# Patient Record
Sex: Male | Born: 1955 | Race: White | Hispanic: No | Smoking: Current every day smoker
Health system: Southern US, Community
[De-identification: ages and names within clinical notes are randomized; demographics above are authoritative.]

## PROBLEM LIST (undated history)

## (undated) DIAGNOSIS — I10 Essential (primary) hypertension: Secondary | ICD-10-CM

## (undated) DIAGNOSIS — F329 Major depressive disorder, single episode, unspecified: Secondary | ICD-10-CM

## (undated) DIAGNOSIS — K759 Inflammatory liver disease, unspecified: Secondary | ICD-10-CM

## (undated) DIAGNOSIS — K219 Gastro-esophageal reflux disease without esophagitis: Secondary | ICD-10-CM

## (undated) DIAGNOSIS — M1990 Unspecified osteoarthritis, unspecified site: Secondary | ICD-10-CM

## (undated) DIAGNOSIS — F32A Depression, unspecified: Secondary | ICD-10-CM

## (undated) HISTORY — PX: WISDOM TOOTH EXTRACTION: SHX21

---

## 2009-07-08 ENCOUNTER — Ambulatory Visit: Payer: Self-pay | Admitting: Internal Medicine

## 2009-09-04 ENCOUNTER — Ambulatory Visit (HOSPITAL_COMMUNITY): Admission: RE | Admit: 2009-09-04 | Discharge: 2009-09-04 | Payer: Self-pay | Admitting: Family Medicine

## 2009-09-10 ENCOUNTER — Ambulatory Visit: Payer: Self-pay | Admitting: Internal Medicine

## 2009-09-11 ENCOUNTER — Encounter (INDEPENDENT_AMBULATORY_CARE_PROVIDER_SITE_OTHER): Payer: Self-pay | Admitting: Family Medicine

## 2009-09-11 LAB — CONVERTED CEMR LAB: Microalb, Ur: 1.43 mg/dL (ref 0.00–1.89)

## 2009-10-11 ENCOUNTER — Encounter (INDEPENDENT_AMBULATORY_CARE_PROVIDER_SITE_OTHER): Payer: Self-pay | Admitting: Family Medicine

## 2009-10-11 ENCOUNTER — Ambulatory Visit: Payer: Self-pay | Admitting: Internal Medicine

## 2009-10-11 LAB — CONVERTED CEMR LAB
ALT: 79 units/L — ABNORMAL HIGH (ref 0–53)
AST: 71 units/L — ABNORMAL HIGH (ref 0–37)
Eosinophils Absolute: 0.3 10*3/uL (ref 0.0–0.7)
HCT: 47.4 % (ref 39.0–52.0)
MCHC: 33.8 g/dL (ref 30.0–36.0)
Monocytes Absolute: 1.1 10*3/uL — ABNORMAL HIGH (ref 0.1–1.0)
Monocytes Relative: 14 % — ABNORMAL HIGH (ref 3–12)
Neutro Abs: 3.6 10*3/uL (ref 1.7–7.7)
Platelets: 254 10*3/uL (ref 150–400)
RBC: 4.98 M/uL (ref 4.22–5.81)
Total Bilirubin: 0.4 mg/dL (ref 0.3–1.2)
WBC: 8 10*3/uL (ref 4.0–10.5)

## 2009-10-15 ENCOUNTER — Encounter (INDEPENDENT_AMBULATORY_CARE_PROVIDER_SITE_OTHER): Payer: Self-pay | Admitting: Family Medicine

## 2009-10-15 LAB — CONVERTED CEMR LAB
Hep A Total Ab: NEGATIVE
Hep B Core Total Ab: NEGATIVE

## 2013-11-07 ENCOUNTER — Other Ambulatory Visit: Payer: Self-pay

## 2013-11-15 ENCOUNTER — Other Ambulatory Visit (INDEPENDENT_AMBULATORY_CARE_PROVIDER_SITE_OTHER): Payer: No Typology Code available for payment source

## 2013-11-15 DIAGNOSIS — B182 Chronic viral hepatitis C: Secondary | ICD-10-CM

## 2013-11-15 LAB — CBC WITH DIFFERENTIAL/PLATELET
BASOS PCT: 2 % — AB (ref 0–1)
Basophils Absolute: 0.1 10*3/uL (ref 0.0–0.1)
EOS ABS: 0.2 10*3/uL (ref 0.0–0.7)
Eosinophils Relative: 3 % (ref 0–5)
HCT: 37.1 % — ABNORMAL LOW (ref 39.0–52.0)
HEMOGLOBIN: 13.1 g/dL (ref 13.0–17.0)
LYMPHS ABS: 2.5 10*3/uL (ref 0.7–4.0)
Lymphocytes Relative: 43 % (ref 12–46)
MCH: 33.2 pg (ref 26.0–34.0)
MCHC: 35.3 g/dL (ref 30.0–36.0)
MCV: 94.2 fL (ref 78.0–100.0)
MONO ABS: 0.8 10*3/uL (ref 0.1–1.0)
Monocytes Relative: 14 % — ABNORMAL HIGH (ref 3–12)
NEUTROS ABS: 2.2 10*3/uL (ref 1.7–7.7)
NEUTROS PCT: 38 % — AB (ref 43–77)
PLATELETS: 239 10*3/uL (ref 150–400)
RBC: 3.94 MIL/uL — AB (ref 4.22–5.81)
RDW: 13.7 % (ref 11.5–15.5)
WBC: 5.8 10*3/uL (ref 4.0–10.5)

## 2013-11-16 LAB — PROTIME-INR
INR: 1.02 (ref <1.50)
Prothrombin Time: 13.4 seconds (ref 11.6–15.2)

## 2013-11-16 LAB — COMPREHENSIVE METABOLIC PANEL
ALK PHOS: 42 U/L (ref 39–117)
ALT: 106 U/L — ABNORMAL HIGH (ref 0–53)
AST: 97 U/L — ABNORMAL HIGH (ref 0–37)
Albumin: 4.5 g/dL (ref 3.5–5.2)
BILIRUBIN TOTAL: 0.4 mg/dL (ref 0.2–1.2)
BUN: 8 mg/dL (ref 6–23)
CALCIUM: 9.2 mg/dL (ref 8.4–10.5)
CHLORIDE: 108 meq/L (ref 96–112)
CO2: 24 meq/L (ref 19–32)
Creat: 0.73 mg/dL (ref 0.50–1.35)
GLUCOSE: 76 mg/dL (ref 70–99)
POTASSIUM: 4.7 meq/L (ref 3.5–5.3)
Sodium: 140 mEq/L (ref 135–145)
TOTAL PROTEIN: 7.1 g/dL (ref 6.0–8.3)

## 2013-11-16 LAB — HEPATITIS B SURFACE ANTIBODY,QUALITATIVE: Hep B S Ab: POSITIVE — AB

## 2013-11-16 LAB — HEPATITIS B SURFACE ANTIGEN: Hepatitis B Surface Ag: NEGATIVE

## 2013-11-16 LAB — IRON: IRON: 152 ug/dL (ref 42–165)

## 2013-11-16 LAB — HEPATITIS B CORE ANTIBODY, TOTAL: HEP B C TOTAL AB: NONREACTIVE

## 2013-11-16 LAB — HEPATITIS A ANTIBODY, TOTAL: Hep A Total Ab: NONREACTIVE

## 2013-11-16 LAB — ANA: Anti Nuclear Antibody(ANA): NEGATIVE

## 2013-11-16 LAB — HIV ANTIBODY (ROUTINE TESTING W REFLEX): HIV: NONREACTIVE

## 2013-11-17 LAB — HEPATITIS C RNA QUANTITATIVE
HCV Quantitative Log: 6.39 {Log} — ABNORMAL HIGH (ref <1.18)
HCV Quantitative: 2457982 IU/mL — ABNORMAL HIGH (ref <15)

## 2013-11-21 LAB — HEPATITIS C GENOTYPE

## 2013-11-30 ENCOUNTER — Telehealth: Payer: Self-pay | Admitting: *Deleted

## 2013-11-30 NOTE — Telephone Encounter (Signed)
Patient was referred by Dr. Marlou Sa at [facility] at [facility] for Hepatitis.  Patient called asking about his recent lab work about his liver enzymes.  He wanted to know why he had so many labs drawn.   Patient was advised he was sent here for labs to confirm hepatitis C.  Patient was advised that we check for diseases associated with Hepatitis as well as more specific HCV tests for treatment options.   Patient stated he was treated about 2 years ago for Hepatitis, but it failed due to his alcohol use.  Patient states he is an alcoholic who drinks multiple 40's throughout the day.  He states that he feels symptoms of withdrawal before he starts drinking each day.  Patient was advised that Dr. Linus Salmons will take this into account, will discuss support opportunities in the community.  Patient is scheduled for 10/19 with Dr. Linus Salmons.  He is advised to consider the steps he would need to commit to for sobriety.  He agreed. Landis Gandy, RN

## 2013-12-25 ENCOUNTER — Ambulatory Visit: Payer: No Typology Code available for payment source | Admitting: Internal Medicine

## 2014-01-30 ENCOUNTER — Encounter: Payer: Self-pay | Admitting: Internal Medicine

## 2014-01-30 ENCOUNTER — Ambulatory Visit (INDEPENDENT_AMBULATORY_CARE_PROVIDER_SITE_OTHER): Payer: No Typology Code available for payment source | Admitting: Internal Medicine

## 2014-01-30 ENCOUNTER — Ambulatory Visit (INDEPENDENT_AMBULATORY_CARE_PROVIDER_SITE_OTHER): Payer: No Typology Code available for payment source | Admitting: *Deleted

## 2014-01-30 VITALS — BP 122/83 | HR 70 | Temp 97.3°F | Wt 175.0 lb

## 2014-01-30 DIAGNOSIS — Z23 Encounter for immunization: Secondary | ICD-10-CM

## 2014-01-30 DIAGNOSIS — B182 Chronic viral hepatitis C: Secondary | ICD-10-CM

## 2014-01-30 NOTE — Addendum Note (Signed)
Addended by: Reggy Eye on: 01/30/2014 02:41 PM   Modules accepted: Orders

## 2014-01-30 NOTE — Progress Notes (Signed)
+Mark Ayala is a 58 y.o. male who presents for initial evaluation and management of a positive Hepatitis C antibody test.  Patient tested positive 20 years ago. Hepatitis C risk factors present are: IV drug abuse (details: remote, many years ago). Patient denies history of blood transfusion, multiple sexual partners, renal dialysis, sexual contact with person with liver disease, tattoos. Patient has had other studies performed. Results: hepatitis C RNA by PCR, result: positive. Patient has not had prior treatment for Hepatitis C. Patient does not have a past history of liver disease. Patient does not have a family history of liver disease.   HPI: He is an active alcohol user though has been abstinent over the last 3 weeks.  He struggles with abuse but is motivated to continue off alcohol.  He has had legal issues related to alcohol and understands the importance of not drinking for those reasons and for his liver.   Patient does not have documented immunity to Hepatitis A. Patient does have documented immunity to Hepatitis B.     Review of Systems A comprehensive review of systems was negative.   No past medical history on file.  Prior to Admission medications   Medication Sig Start Date End Date Taking? Authorizing Provider  citalopram (CELEXA) 40 MG tablet Take 40 mg by mouth daily.   Yes Historical Provider, MD  losartan (COZAAR) 100 MG tablet Take 100 mg by mouth daily.   Yes Historical Provider, MD    No Known Allergies  History  Substance Use Topics  . Smoking status: Light Tobacco Smoker    Types: Cigars  . Smokeless tobacco: Never Used  . Alcohol Use: No    No family history on file.    Objective:   Filed Vitals:   01/30/14 0932  BP: 122/83  Pulse: 70  Temp: 97.3 F (36.3 C)   in no apparent distress and alert HEENT: anicteric Cor RRR and No murmurs clear Bowel sounds are normal, liver is not enlarged, spleen is not enlarged peripheral pulses normal, no pedal  edema, no clubbing or cyanosis negative for - jaundice, spider hemangioma, telangiectasia, palmar erythema, ecchymosis and atrophy  Laboratory Genotype:  Lab Results  Component Value Date   HCVGENOTYPE 1a 11/15/2013   HCV viral load:  Lab Results  Component Value Date   HCVQUANT [id number]* 11/15/2013   Lab Results  Component Value Date   WBC 5.8 11/15/2013   HGB 13.1 11/15/2013   HCT 37.1* 11/15/2013   MCV 94.2 11/15/2013   PLT 239 11/15/2013    Lab Results  Component Value Date   CREATININE 0.73 11/15/2013   BUN 8 11/15/2013   NA 140 11/15/2013   K 4.7 11/15/2013   CL 108 11/15/2013   CO2 24 11/15/2013    Lab Results  Component Value Date   ALT 106* 11/15/2013   AST 97* 11/15/2013   ALKPHOS 42 11/15/2013   BILITOT 0.4 11/15/2013   INR 1.02 11/15/2013      Assessment: Hepatitis C genotype 1a  Plan: 1) Patient counseled extensively on limiting acetaminophen to no more than 2 grams daily, avoidance of alcohol. 2) Transmission discussed with patient including sexual transmission, sharing razors and toothbrush.   3) Will need referral to gastroenterology if concern for cirrhosis 4) Will need referral for substance abuse counseling: Yes.  Will refer him to SA counseling to help him continue to cope without alcohol, find services if needed such as AA.   5) Will prescribe Harvoni for 12 weeks once  work up complete 6) Hepatitis A vaccine Yes.   7) Hepatitis B vaccine No. 8) Pneumovax vaccine if concern for cirrhosis 9) will follow up after elastography to go over results

## 2014-02-15 ENCOUNTER — Ambulatory Visit (HOSPITAL_COMMUNITY)
Admission: RE | Admit: 2014-02-15 | Discharge: 2014-02-15 | Disposition: A | Discharge status: Home / Self Care | Payer: No Typology Code available for payment source | Source: Ambulatory Visit | Attending: Internal Medicine | Admitting: Internal Medicine

## 2014-02-15 ENCOUNTER — Other Ambulatory Visit: Payer: Self-pay | Admitting: Internal Medicine

## 2014-02-15 DIAGNOSIS — B182 Chronic viral hepatitis C: Secondary | ICD-10-CM | POA: Insufficient documentation

## 2014-02-15 MED ORDER — LEDIPASVIR-SOFOSBUVIR 90-400 MG PO TABS: 1.0000 | ORAL_TABLET | Freq: Every day | ORAL | Status: DC

## 2014-03-13 ENCOUNTER — Encounter: Payer: Self-pay | Admitting: Internal Medicine

## 2014-03-13 ENCOUNTER — Ambulatory Visit (INDEPENDENT_AMBULATORY_CARE_PROVIDER_SITE_OTHER): Payer: Self-pay | Admitting: Internal Medicine

## 2014-03-13 VITALS — BP 153/91 | HR 87 | Temp 98.1°F | Ht 71.0 in | Wt 178.0 lb

## 2014-03-13 DIAGNOSIS — B182 Chronic viral hepatitis C: Secondary | ICD-10-CM

## 2014-03-13 NOTE — Patient Instructions (Signed)
Date 03/13/14  Dear Mr. Vanatta, As discussed in the [facility], your hepatitis C therapy will include the following medications:          Harvoni 90mg /400mg  tablet:           Take 1 tablet by mouth once daily   Please note that ALL MEDICATIONS WILL START ON THE SAME DATE for a total of 12 weeks. 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  Remember, each prescription is for 28 days. ---------------------------------------------------------------- GENERAL NOTES REGARDING YOUR HEPATITIS C MEDICATION:  SOFOSBUVIR/LEDIPASVIR (HARVONI): - Harvoni tablet is taken daily with OR without food. - The tablets are orange. - The tablets should be stored at room temperature.  - Acid reducing agents such as H2 blockers (ie. Pepcid (famotidine), Zantac (ranitidine), Tagamet (cimetidine), Axid (nizatidine) and proton pump inhibitors (ie. Prilosec (omeprazole), Protonix (pantoprazole), Nexium (esomeprazole), or Aciphex (rabeprazole)) can decrease effectiveness of Harvoni. Do not take until you have discussed with a health care provider.    -Antacids that contain magnesium and/or aluminum hydroxide (ie. Milk of Magensia, Rolaids, Gaviscon, Maalox, Mylanta, an dArthritis Pain Formula)can reduce absorption of Harvoni, so take them at least 4 hours before or after Harvoni.  -Calcium carbonate (calcium supplements or antacids such as Tums, Caltrate,  Os-Cal)needs to be taken at least 4 hours hours before or after Harvoni.  -St. John's wort or any products that contain St. John's wort like some herbal supplements  Please inform the office prior to starting any of these medications.  - The common side effects with Harvoni:      1. Fatigue      2. Headache      3. Nausea      4. Diarrhea      5. Insomnia   Please note that this only lists the most common side effects and is NOT a comprehensive list of the potential side effects of these medications. For more information, please review the drug information sheets that come with your medication package from the pharmacy.  ---------------------------------------------------------------- GENERAL HELPFUL HINTS ON HCV THERAPY: 1. No alcohol. 2. Protect against sun-sensitivity/sunburns (wear sunglasses, hat, long sleeves, pants and sunscreen). 3. Stay well-hydrated/well-moisturized. 4. Notify the [facility] of any changes in your other over-the-counter/herbal or prescription medications. 5. If you miss a dose of your medication, take the missed dose as soon as you remember. Return to your regular time/dose schedule the next day.  6.  Do not stop taking your medications without first talking with your healthcare provider. 7.  You may take Tylenol (acetaminophen), as long as the dose is less than 2000 mg (OR no more than 4 tablets of the Tylenol Extra Strengths 500mg  tablet) in 24 hours. 8.  You will need to obtain routine labs and/or office visits at [facility] at weeks 2, 4, 8,  and 12 as well as 12 and 24 weeks after completion of treatment.   Scharlene Gloss, [place] [facility] [address] [place], [place]  [address] [phone]

## 2014-03-13 NOTE — Progress Notes (Signed)
   Subjective:    Patient ID: Mark Ayala, male    DOB: 07/09/1955, 59 y.o.   MRN: [id number]  HPI He is here for follow-up of his hepatitis C.  He has genotype 1A with initial viral load of over 2 million. He has a remote history of drug abuse and does drink alcohol. He had elastography which is F2 to [facility].     Review of Systems  Constitutional: Negative for fatigue.  Gastrointestinal: Negative for abdominal pain.  Skin: Negative for rash.       Objective:   Physical Exam  Constitutional: He appears well-developed and well-nourished. No distress.  Eyes: No scleral icterus.  Cardiovascular: Normal rate, regular rhythm and normal heart sounds.   No murmur heard. Pulmonary/Chest: Effort normal and breath sounds normal. No respiratory distress.  Skin: No rash noted.          Assessment & Plan:

## 2014-03-13 NOTE — Assessment & Plan Note (Signed)
I discussed results of his elastography. He continues to abstain from alcohol. Hopefully will get medication through the drug assistance program. He will follow-up after starting medication or in one year.

## 2014-03-14 ENCOUNTER — Other Ambulatory Visit: Payer: Self-pay | Admitting: Internal Medicine

## 2014-03-14 MED ORDER — LEDIPASVIR-SOFOSBUVIR 90-400 MG PO TABS: 1.0000 | ORAL_TABLET | Freq: Every day | ORAL | Status: DC

## 2014-03-15 ENCOUNTER — Ambulatory Visit: Payer: No Typology Code available for payment source | Admitting: Internal Medicine

## 2014-07-07 ENCOUNTER — Emergency Department (HOSPITAL_COMMUNITY)
Admission: EM | Admit: 2014-07-07 | Discharge: 2014-07-07 | Disposition: A | Discharge status: Home / Self Care | Payer: Self-pay | Attending: Emergency Medicine | Admitting: Emergency Medicine

## 2014-07-07 ENCOUNTER — Emergency Department (HOSPITAL_COMMUNITY): Payer: Self-pay

## 2014-07-07 ENCOUNTER — Encounter (HOSPITAL_COMMUNITY): Payer: Self-pay | Admitting: Emergency Medicine

## 2014-07-07 DIAGNOSIS — F10120 Alcohol abuse with intoxication, uncomplicated: Secondary | ICD-10-CM | POA: Insufficient documentation

## 2014-07-07 DIAGNOSIS — S50311A Abrasion of right elbow, initial encounter: Secondary | ICD-10-CM | POA: Insufficient documentation

## 2014-07-07 DIAGNOSIS — F10920 Alcohol use, unspecified with intoxication, uncomplicated: Secondary | ICD-10-CM

## 2014-07-07 DIAGNOSIS — Y998 Other external cause status: Secondary | ICD-10-CM | POA: Insufficient documentation

## 2014-07-07 DIAGNOSIS — S0990XA Unspecified injury of head, initial encounter: Secondary | ICD-10-CM | POA: Insufficient documentation

## 2014-07-07 DIAGNOSIS — Z79899 Other long term (current) drug therapy: Secondary | ICD-10-CM | POA: Insufficient documentation

## 2014-07-07 DIAGNOSIS — S50312A Abrasion of left elbow, initial encounter: Secondary | ICD-10-CM | POA: Insufficient documentation

## 2014-07-07 DIAGNOSIS — Y9289 Other specified places as the place of occurrence of the external cause: Secondary | ICD-10-CM | POA: Insufficient documentation

## 2014-07-07 DIAGNOSIS — Y9389 Activity, other specified: Secondary | ICD-10-CM | POA: Insufficient documentation

## 2014-07-07 DIAGNOSIS — W01198A Fall on same level from slipping, tripping and stumbling with subsequent striking against other object, initial encounter: Secondary | ICD-10-CM | POA: Insufficient documentation

## 2014-07-07 NOTE — Discharge Instructions (Signed)
Alcohol Intoxication  Alcohol intoxication occurs when the amount of alcohol that a person has consumed impairs his or her ability to mentally and physically function. Alcohol directly impairs the normal chemical activity of the brain. Drinking large amounts of alcohol can lead to changes in mental function and behavior, and it can cause many physical effects that can be harmful.   Alcohol intoxication can range in severity from mild to very severe. Various factors can affect the level of intoxication that occurs, such as the person's age, gender, weight, frequency of alcohol consumption, and the presence of other medical conditions (such as diabetes, seizures, or heart conditions). Dangerous levels of alcohol intoxication may occur when people drink large amounts of alcohol in a short period (binge drinking). Alcohol can also be especially dangerous when combined with certain prescription medicines or "recreational" drugs.  SIGNS AND SYMPTOMS  Some common signs and symptoms of mild alcohol intoxication include:  · Loss of coordination.  · Changes in mood and behavior.  · Impaired judgment.  · Slurred speech.  As alcohol intoxication progresses to more severe levels, other signs and symptoms will appear. These may include:  · Vomiting.  · Confusion and impaired memory.  · Slowed breathing.  · Seizures.  · Loss of consciousness.  DIAGNOSIS   Your health care provider will take a medical history and perform a physical exam. You will be asked about the amount and type of alcohol you have consumed. Blood tests will be done to measure the concentration of alcohol in your blood. In many places, your blood alcohol level must be lower than 80 mg/dL (0.08%) to legally drive. However, many dangerous effects of alcohol can occur at much lower levels.   TREATMENT   People with alcohol intoxication often do not require treatment. Most of the effects of alcohol intoxication are temporary, and they go away as the alcohol naturally  leaves the body. Your health care provider will monitor your condition until you are stable enough to go home. Fluids are sometimes given through an IV access tube to help prevent dehydration.   HOME CARE INSTRUCTIONS  · Do not drive after drinking alcohol.  · Stay hydrated. Drink enough water and fluids to keep your urine clear or pale yellow. Avoid caffeine.    · Only take over-the-counter or prescription medicines as directed by your health care provider.    SEEK MEDICAL CARE IF:   · You have persistent vomiting.    · You do not feel better after a few days.  · You have frequent alcohol intoxication. Your health care provider can help determine if you should see a substance use treatment counselor.  SEEK IMMEDIATE MEDICAL CARE IF:   · You become shaky or tremble when you try to stop drinking.    · You shake uncontrollably (seizure).    · You throw up (vomit) blood. This may be bright red or may look like black coffee grounds.    · You have blood in your stool. This may be bright red or may appear as a black, tarry, bad smelling stool.    · You become lightheaded or faint.    MAKE SURE YOU:   · Understand these instructions.  · Will watch your condition.  · Will get help right away if you are not doing well or get worse.  Document Released: 12/03/2004 Document Revised: 10/26/2012 Document Reviewed: 07/29/2012  ExitCare® Patient Information ©2015 [employer]. This information is not intended to replace advice given to you by your health care provider. Make sure   you discuss any questions you have with your health care provider.

## 2014-07-07 NOTE — ED Notes (Signed)
Bed: WA16 Expected date:  Expected time:  Means of arrival:  Comments: EMS 

## 2014-07-07 NOTE — ED Notes (Signed)
Patient transported to CT Patient in NAD upon leaving for testing

## 2014-07-07 NOTE — ED Notes (Signed)
Pt reports drinking liquor tonight which he states he doesn't normally do.  Pt is now pleasant and agreeable at this time.  Pt remains unsteady on his feet.
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
## 2016-02-05 ENCOUNTER — Other Ambulatory Visit: Payer: Self-pay | Admitting: Gastroenterology

## 2016-02-05 ENCOUNTER — Encounter (HOSPITAL_COMMUNITY): Payer: Self-pay | Admitting: *Deleted

## 2016-02-10 ENCOUNTER — Encounter (HOSPITAL_COMMUNITY)
Admission: RE | Disposition: A | Discharge status: Home / Self Care | Payer: Self-pay | Source: Ambulatory Visit | Attending: Gastroenterology

## 2016-02-10 ENCOUNTER — Ambulatory Visit (HOSPITAL_COMMUNITY): Payer: Self-pay | Admitting: Anesthesiology

## 2016-02-10 ENCOUNTER — Ambulatory Visit (HOSPITAL_COMMUNITY)
Admission: RE | Admit: 2016-02-10 | Discharge: 2016-02-10 | Disposition: A | Discharge status: Home / Self Care | Payer: Self-pay | Source: Ambulatory Visit | Attending: Gastroenterology | Admitting: Gastroenterology

## 2016-02-10 ENCOUNTER — Encounter (HOSPITAL_COMMUNITY): Payer: Self-pay | Admitting: *Deleted

## 2016-02-10 DIAGNOSIS — K2950 Unspecified chronic gastritis without bleeding: Secondary | ICD-10-CM | POA: Insufficient documentation

## 2016-02-10 DIAGNOSIS — G4730 Sleep apnea, unspecified: Secondary | ICD-10-CM | POA: Insufficient documentation

## 2016-02-10 DIAGNOSIS — Z8619 Personal history of other infectious and parasitic diseases: Secondary | ICD-10-CM | POA: Insufficient documentation

## 2016-02-10 DIAGNOSIS — K2210 Ulcer of esophagus without bleeding: Secondary | ICD-10-CM | POA: Insufficient documentation

## 2016-02-10 DIAGNOSIS — K449 Diaphragmatic hernia without obstruction or gangrene: Secondary | ICD-10-CM | POA: Insufficient documentation

## 2016-02-10 DIAGNOSIS — Z833 Family history of diabetes mellitus: Secondary | ICD-10-CM | POA: Insufficient documentation

## 2016-02-10 DIAGNOSIS — Z8249 Family history of ischemic heart disease and other diseases of the circulatory system: Secondary | ICD-10-CM | POA: Insufficient documentation

## 2016-02-10 DIAGNOSIS — G4700 Insomnia, unspecified: Secondary | ICD-10-CM | POA: Insufficient documentation

## 2016-02-10 DIAGNOSIS — K3189 Other diseases of stomach and duodenum: Secondary | ICD-10-CM | POA: Insufficient documentation

## 2016-02-10 DIAGNOSIS — I10 Essential (primary) hypertension: Secondary | ICD-10-CM | POA: Insufficient documentation

## 2016-02-10 DIAGNOSIS — D125 Benign neoplasm of sigmoid colon: Secondary | ICD-10-CM | POA: Insufficient documentation

## 2016-02-10 DIAGNOSIS — K5520 Angiodysplasia of colon without hemorrhage: Secondary | ICD-10-CM | POA: Insufficient documentation

## 2016-02-10 DIAGNOSIS — Z87891 Personal history of nicotine dependence: Secondary | ICD-10-CM | POA: Insufficient documentation

## 2016-02-10 DIAGNOSIS — Z1211 Encounter for screening for malignant neoplasm of colon: Secondary | ICD-10-CM | POA: Insufficient documentation

## 2016-02-10 DIAGNOSIS — F329 Major depressive disorder, single episode, unspecified: Secondary | ICD-10-CM | POA: Insufficient documentation

## 2016-02-10 DIAGNOSIS — D123 Benign neoplasm of transverse colon: Secondary | ICD-10-CM | POA: Insufficient documentation

## 2016-02-10 DIAGNOSIS — Z885 Allergy status to narcotic agent status: Secondary | ICD-10-CM | POA: Insufficient documentation

## 2016-02-10 DIAGNOSIS — Z79899 Other long term (current) drug therapy: Secondary | ICD-10-CM | POA: Insufficient documentation

## 2016-02-10 HISTORY — DX: Unspecified osteoarthritis, unspecified site: M19.90

## 2016-02-10 HISTORY — DX: Major depressive disorder, single episode, unspecified: F32.9

## 2016-02-10 HISTORY — DX: Depression, unspecified: F32.A

## 2016-02-10 HISTORY — PX: ESOPHAGOGASTRODUODENOSCOPY (EGD) WITH PROPOFOL: SHX5813

## 2016-02-10 HISTORY — DX: Inflammatory liver disease, unspecified: K75.9

## 2016-02-10 HISTORY — DX: Gastro-esophageal reflux disease without esophagitis: K21.9

## 2016-02-10 HISTORY — DX: Essential (primary) hypertension: I10

## 2016-02-10 HISTORY — PX: COLONOSCOPY WITH PROPOFOL: SHX5780

## 2016-02-10 SURGERY — COLONOSCOPY WITH PROPOFOL
Anesthesia: Monitor Anesthesia Care

## 2016-02-10 MED ORDER — LIDOCAINE 2% (20 MG/ML) 5 ML SYRINGE
INTRAMUSCULAR | Status: AC
  Filled 2016-02-10: qty 5

## 2016-02-10 MED ORDER — LACTATED RINGERS IV SOLN
INTRAVENOUS | Status: DC
  Administered 2016-02-10: 1000 mL via INTRAVENOUS

## 2016-02-10 MED ORDER — LIDOCAINE HCL (CARDIAC) 20 MG/ML IV SOLN
INTRAVENOUS | Status: DC | PRN
  Administered 2016-02-10: 100 mg via INTRAVENOUS

## 2016-02-10 MED ORDER — PROPOFOL 10 MG/ML IV BOLUS
INTRAVENOUS | Status: AC
  Filled 2016-02-10: qty 20

## 2016-02-10 MED ORDER — GLYCOPYRROLATE 0.2 MG/ML IJ SOLN
INTRAMUSCULAR | Status: DC | PRN
  Administered 2016-02-10: 0.2 mg via INTRAVENOUS

## 2016-02-10 MED ORDER — SODIUM CHLORIDE 0.9 % IV SOLN: INTRAVENOUS | Status: DC

## 2016-02-10 MED ORDER — PROPOFOL 500 MG/50ML IV EMUL
INTRAVENOUS | Status: DC | PRN
  Administered 2016-02-10: 300 ug/kg/min via INTRAVENOUS

## 2016-02-10 MED ORDER — GLYCOPYRROLATE 0.2 MG/ML IV SOSY
PREFILLED_SYRINGE | INTRAVENOUS | Status: AC
Start: 2016-02-10 — End: 2016-02-10
  Filled 2016-02-10: qty 3

## 2016-02-10 MED ORDER — PROPOFOL 10 MG/ML IV BOLUS
INTRAVENOUS | Status: AC
  Filled 2016-02-10: qty 60

## 2016-02-10 SURGICAL SUPPLY — 24 items

## 2016-02-10 NOTE — Discharge Instructions (Signed)
Colonoscopy, Adult, Care After This sheet gives you information about how to care for yourself after your procedure. Your doctor may also give you more specific instructions. If you have problems or questions, call your doctor. Follow these instructions at home: General instructions  For the first 24 hours after the procedure:  Do not drive or use machinery.  Do not sign important documents.  Do not drink alcohol.  Do your daily activities more slowly than normal.  Eat foods that are soft and easy to digest.  Rest often.  Take over-the-counter or prescription medicines only as told by your doctor.  It is up to you to get the results of your procedure. Ask your doctor, or the department performing the procedure, when your results will be ready. To help cramping and bloating:  Try walking around.  Put heat on your belly (abdomen) as told by your doctor. Use a heat source that your doctor recommends, such as a moist heat pack or a heating pad.  Put a towel between your skin and the heat source.  Leave the heat on for 20-30 minutes.  Remove the heat if your skin turns bright red. This is especially important if you cannot feel pain, heat, or cold. You can get burned. Eating and drinking  Drink enough fluid to keep your pee (urine) clear or pale yellow.  Return to your normal diet as told by your doctor. Avoid heavy or fried foods that are hard to digest.  Avoid drinking alcohol for as long as told by your doctor. Contact a doctor if:  You have blood in your poop (stool) 2-3 days after the procedure. Get help right away if:  You have more than a small amount of blood in your poop.  You see large clumps of tissue (blood clots) in your poop.  Your belly is swollen.  You feel sick to your stomach (nauseous).  You throw up (vomit).  You have a fever.  You have belly pain that gets worse, and medicine does not help your pain. This information is not intended to replace  advice given to you by your health care provider. Make sure you discuss any questions you have with your health care provider. Document Released: 03/28/2010 Document Revised: 11/18/2015 Document Reviewed: 11/18/2015 Elsevier Interactive Patient Education  2017 [place]. Esophagogastroduodenoscopy, Care After Introduction Refer to this sheet in the next few weeks. These instructions provide you with information about caring for yourself after your procedure. Your health care provider may also give you more specific instructions. Your treatment has been planned according to current medical practices, but problems sometimes occur. Call your health care provider if you have any problems or questions after your procedure. What can I expect after the procedure? After the procedure, it is common to have:  A sore throat.  Nausea.  Bloating.  Dizziness.  Fatigue. Follow these instructions at home:  Do not eat or drink anything until the numbing medicine (local anesthetic) has worn off and your gag reflex has returned. You will know that the local anesthetic has worn off when you can swallow comfortably.  Do not drive for 24 hours if you received a medicine to help you relax (sedative).  If your health care provider took a tissue sample for testing during the procedure, make sure to get your test results. This is your responsibility. Ask your health care provider or the department performing the test when your results will be ready.  Keep all follow-up visits as told by  your health care provider. This is important. Contact a health care provider if:  You cannot stop coughing.  You are not urinating.  You are urinating less than usual. Get help right away if:  You have trouble swallowing.  You cannot eat or drink.  You have throat or chest pain that gets worse.  You are dizzy or light-headed.  You faint.  You have nausea or vomiting.  You have chills.  You have a  fever.  You have severe abdominal pain.  You have black, tarry, or bloody stools. This information is not intended to replace advice given to you by your health care provider. Make sure you discuss any questions you have with your health care provider. Document Released: 02/10/2012 Document Revised: 08/01/2015 Document Reviewed: 01/17/2015  2017 Elsevier Call if question or problem otherwise call in 1 week for biopsy report and start Prilosec 1 a day over-the-counter or get from primary care and take long-term for reflux

## 2016-02-10 NOTE — Anesthesia Preprocedure Evaluation (Signed)
Anesthesia Evaluation  Patient identified by MRN, date of birth, ID band Patient awake    Reviewed: Allergy & Precautions, H&P , NPO status , Patient's Chart, lab work & pertinent test results  Airway Mallampati: II   Neck ROM: full    Dental   Pulmonary former smoker,    breath sounds clear to auscultation       Cardiovascular hypertension,  Rhythm:regular Rate:Normal     Neuro/Psych PSYCHIATRIC DISORDERS Depression    GI/Hepatic GERD  ,(+)     substance abuse  alcohol use, Hepatitis -, C  Endo/Other    Renal/GU      Musculoskeletal  (+) Arthritis ,   Abdominal   Peds  Hematology   Anesthesia Other Findings   Reproductive/Obstetrics                             Anesthesia Physical Anesthesia Plan  ASA: II  Anesthesia Plan: MAC   Post-op Pain Management:    Induction: Intravenous  Airway Management Planned: Nasal Cannula  Additional Equipment:   Intra-op Plan:   Post-operative Plan:   Informed Consent: I have reviewed the patients History and Physical, chart, labs and discussed the procedure including the risks, benefits and alternatives for the proposed anesthesia with the patient or authorized representative who has indicated his/her understanding and acceptance.     Plan Discussed with: CRNA, Anesthesiologist and Surgeon  Anesthesia Plan Comments:         Anesthesia Quick Evaluation

## 2016-02-10 NOTE — Op Note (Signed)
[facility] Patient Name: Mark Ayala Procedure Date: 02/10/2016 MRN: [id number] Attending MD: Clarene Essex , MD Date of Birth: May 08, 1955 CSN: [id number] Age: 60 Admit Type: Outpatient Procedure:                Colonoscopy Indications:              Screening for colorectal malignant neoplasm, This                            is the patient's first colonoscopy Providers:                Clarene Essex, MD, Dustin Flock RN, RN, William Dalton, Technician Referring MD:              Medicines:                Propofol total dose [id number] mg IV Complications:            No immediate complications. Estimated Blood Loss:     Estimated blood loss: none. Procedure:                Pre-Anesthesia Assessment:                           - Prior to the procedure, a History and Physical                            was performed, and patient medications and                            allergies were reviewed. The patient's tolerance of                            previous anesthesia was also reviewed. The risks                            and benefits of the procedure and the sedation                            options and risks were discussed with the patient.                            All questions were answered, and informed consent                            was obtained. Prior Anticoagulants: The patient has                            taken no previous anticoagulant or antiplatelet                            agents. ASA Grade Assessment: II - A patient with  mild systemic disease. After reviewing the risks                            and benefits, the patient was deemed in                            satisfactory condition to undergo the procedure.                           After obtaining informed consent, the colonoscope                            was passed under direct vision. Throughout the                            procedure, the  patient's blood pressure, pulse, and                            oxygen saturations were monitored continuously. The                            [id number]) scope was introduced through the                            mouth, and advanced to the the cecum, identified by                            appendiceal orifice and ileocecal valve. After                            obtaining informed consent, the colonoscope was                            passed under direct vision. Throughout the                            procedure, the patient's blood pressure, pulse, and                            oxygen saturations were monitored continuously. The                            colonoscopy was performed without difficulty. The                            patient tolerated the procedure well. The quality                            of the bowel preparation was adequate. The quality                            of the bowel preparation was adequate. The  ileocecal valve, appendiceal orifice, and rectum                            were photographed. Scope In: 11:24:49 AM Scope Out: 11:42:36 AM Scope Withdrawal Time: 0 hours 12 minutes 42 seconds  Total Procedure Duration: 0 hours 17 minutes 47 seconds  Findings:      Two semi-sessile polyps were found in the mid sigmoid colon and mid       transverse colon. The polyps were small in size. These polyps were       removed with a cold snare. Resection and retrieval were complete.      The exam was otherwise without abnormality.      A single small angiodysplastic lesion without bleeding was found in the       proximal ascending colon. Impression:               - Two small polyps in the mid sigmoid colon and in                            the mid transverse colon, removed with a cold                            snare. Resected and retrieved.                           - The examination was otherwise normal.                           -  A single non-bleeding colonic angiodysplastic                            lesion. Moderate Sedation:      N/A- Per Anesthesia Care Recommendation:           - Patient has a contact number available for                            emergencies. The signs and symptoms of potential                            delayed complications were discussed with the                            patient. Return to normal activities tomorrow.                            Written discharge instructions were provided to the                            patient.                           - Soft diet today.                           - Continue present medications.                           -  Await pathology results.                           - Repeat colonoscopy in 5 years for surveillance                            based on pathology results.                           - Return to GI office PRN.                           - Telephone GI clinic for pathology results in 1                            week.                           - Telephone GI clinic if symptomatic PRN. Procedure Code(s):        --- Professional ---                           [phone], Colonoscopy, flexible; with removal of                            tumor(s), polyp(s), or other lesion(s) by snare                            technique Diagnosis Code(s):        --- Professional ---                           Z12.11, Encounter for screening for malignant                            neoplasm of colon                           D12.5, Benign neoplasm of sigmoid colon                           D12.3, Benign neoplasm of transverse colon (hepatic                            flexure or splenic flexure) CPT copyright 2016 [employer]. All rights reserved. The codes documented in this report are preliminary and upon coder review may  be revised to meet current compliance requirements. Clarene Essex, MD 02/10/2016 11:53:00 AM This report has been signed  electronically. Number of Addenda: 0

## 2016-02-10 NOTE — Progress Notes (Signed)
Mark Ayala 10:23 AM  Subjective: Patient without any new complaints since we saw him in the office but he did drink some beer this morning  Objective: Vital signs stable afebrile no acute distress exam please see preassessment evaluation  Assessment: Reflux and colonic screening and variceal evaluation for probable cirrhosis  Plan: Okay to proceed with colonoscopy and endoscopy with anesthesia assistance unfortunately will wait the allotted time since he drank this morning as above  [facility]  Pager [phone] After 5PM or if no answer call [phone]

## 2016-02-10 NOTE — Op Note (Signed)
[facility] Patient Name: Mark Ayala Procedure Date: 02/10/2016 MRN: [id number] Attending MD: Clarene Essex , MD Date of Birth: 15-Feb-1956 CSN: [id number] Age: 60 Admit Type: Outpatient Procedure:                Upper GI endoscopy Indications:              Suspected esophageal reflux Providers:                Clarene Essex, MD, Dustin Flock RN, RN, William Dalton, Technician Referring MD:              Medicines:                Propofol total dose 150 mg IV,100 mg IV lidocaine                            0.2 mg Robinul Complications:            No immediate complications. Estimated Blood Loss:     Estimated blood loss: none. Procedure:                Pre-Anesthesia Assessment:                           - Prior to the procedure, a History and Physical                            was performed, and patient medications and                            allergies were reviewed. The patient's tolerance of                            previous anesthesia was also reviewed. The risks                            and benefits of the procedure and the sedation                            options and risks were discussed with the patient.                            All questions were answered, and informed consent                            was obtained. Prior Anticoagulants: The patient has                            taken no previous anticoagulant or antiplatelet                            agents. ASA Grade Assessment: II - A patient with  mild systemic disease. After reviewing the risks                            and benefits, the patient was deemed in                            satisfactory condition to undergo the procedure.                           After obtaining informed consent, the endoscope was                            passed under direct vision. Throughout the                            procedure, the patient's blood  pressure, pulse, and                            oxygen saturations were monitored continuously. The                            [id number]) scope was introduced through                            the anus and advanced to the third part of                            duodenum. After obtaining informed consent, the                            endoscope was passed under direct vision.                            Throughout the procedure, the patient's blood                            pressure, pulse, and oxygen saturations were                            monitored continuously. The upper GI endoscopy was                            accomplished without difficulty. The patient                            tolerated the procedure well. Scope In: Scope Out: Findings:      The larynx was normal.      Few linear esophageal ulcers with no bleeding and no stigmata of recent       bleeding were found.      A small hiatal hernia was present.      Patchy minimal inflammation characterized by congestion (edema) and       erythema was found in the gastric antrum.      Localized mildly erythematous mucosa was found in the duodenal bulb.  The second portion of the duodenum and third portion of the duodenum       were normal.      The exam was otherwise without abnormality. Impression:               - Normal larynx.                           - Non-bleeding esophageal ulcers.                           - Small hiatal hernia.                           - Chronic gastritis.                           - Erythematous duodenopathy.                           - Normal second portion of the duodenum and third                            portion of the duodenum.                           - The examination was otherwise normal.                           - No specimens collected. Moderate Sedation:      N/A- Per Anesthesia Care Recommendation:           - Patient has a contact number available for                             emergencies. The signs and symptoms of potential                            delayed complications were discussed with the                            patient. Return to normal activities tomorrow.                            Written discharge instructions were provided to the                            patient.                           - Soft diet today.                           - Perform a colonoscopy today.                           - Return to GI clinic PRN.                           -  Telephone GI clinic if symptomatic PRN.                           - Continue present medications. Procedure Code(s):        --- Professional ---                           [phone], Esophagogastroduodenoscopy, flexible,                            transoral; diagnostic, including collection of                            specimen(s) by brushing or washing, when performed                            (separate procedure) Diagnosis Code(s):        --- Professional ---                           K22.10, Ulcer of esophagus without bleeding                           K44.9, Diaphragmatic hernia without obstruction or                            gangrene                           K29.50, Unspecified chronic gastritis without                            bleeding                           K31.89, Other diseases of stomach and duodenum CPT copyright 2016 American Medical Association. All rights reserved. The codes documented in this report are preliminary and upon coder review may  be revised to meet current compliance requirements. Clarene Essex, MD 02/10/2016 11:48:57 AM This report has been signed electronically. Number of Addenda: 0

## 2016-02-10 NOTE — Anesthesia Postprocedure Evaluation (Signed)
Anesthesia Post Note  Patient: Mark Ayala  Procedure(s) Performed: Procedure(s) (LRB): COLONOSCOPY WITH PROPOFOL (N/A) ESOPHAGOGASTRODUODENOSCOPY (EGD) WITH PROPOFOL (N/A)  Patient location during evaluation: PACU Anesthesia Type: MAC Level of consciousness: awake and alert Pain management: pain level controlled Vital Signs Assessment: post-procedure vital signs reviewed and stable Respiratory status: spontaneous breathing, nonlabored ventilation, respiratory function stable and patient connected to nasal cannula oxygen Cardiovascular status: stable and blood pressure returned to baseline Anesthetic complications: no    Last Vitals:  Vitals:   02/10/16 1210 02/10/16 1220  BP: (!) 199/112 (!) 174/105  Pulse: 73   Resp: 18   Temp:      Last Pain:  Vitals:   02/10/16 1151  TempSrc: Oral                 Alleen Kehm S

## 2016-02-10 NOTE — Transfer of Care (Signed)
Immediate Anesthesia Transfer of Care Note  Patient: Rodrick Priestly  Procedure(s) Performed: Procedure(s): COLONOSCOPY WITH PROPOFOL (N/A) ESOPHAGOGASTRODUODENOSCOPY (EGD) WITH PROPOFOL (N/A)  Patient Location: PACU  Anesthesia Type:MAC  Level of Consciousness: awake, alert , oriented and patient cooperative  Airway & Oxygen Therapy: Patient Spontanous Breathing and Patient connected to nasal cannula oxygen  Post-op Assessment: Report given to RN, Post -op Vital signs reviewed and stable and Patient moving all extremities X 4  Post vital signs: stable  Last Vitals:  Vitals:   02/10/16 1008 02/10/16 1151  BP: (!) 156/95 (!) 146/103  Pulse:  74  Resp: 18 15  Temp: 36.3 C 36.3 C    Last Pain:  Vitals:   02/10/16 1151  TempSrc: Oral         Complications: No apparent anesthesia complications

## 2016-02-11 ENCOUNTER — Encounter (HOSPITAL_COMMUNITY): Payer: Self-pay | Admitting: Gastroenterology

## 2016-08-05 ENCOUNTER — Encounter (HOSPITAL_COMMUNITY): Payer: Self-pay | Admitting: Family Medicine

## 2016-08-05 ENCOUNTER — Ambulatory Visit (HOSPITAL_COMMUNITY)
Admission: EM | Admit: 2016-08-05 | Discharge: 2016-08-05 | Disposition: A | Discharge status: Home / Self Care | Payer: Self-pay | Attending: Emergency Medicine | Admitting: Emergency Medicine

## 2016-08-05 DIAGNOSIS — R21 Rash and other nonspecific skin eruption: Secondary | ICD-10-CM

## 2016-08-05 MED ORDER — HYDROXYZINE HCL 25 MG PO TABS: 25.0000 mg | ORAL_TABLET | Freq: Four times a day (QID) | ORAL | 0 refills | Status: DC

## 2016-08-05 NOTE — ED Provider Notes (Signed)
CSN: [id number]     Arrival date & time 08/05/16  1326 History   None    Chief Complaint  Patient presents with  . Rash   (Consider location/radiation/quality/duration/timing/severity/associated sxs/prior Treatment) 61 yr old male pt presents with recurrent rash, treated several weeks prior for presumed scabies. Pt states not improved after treatment or got re infested. Pt denies animals or contact/travel with possible mite exposure.    The history is provided by the patient. No language interpreter was used.  Rash  Location:  Full body Quality: itchiness and redness   Severity:  Moderate Onset quality:  Gradual Timing:  Constant Progression:  Unchanged Chronicity:  Recurrent Associated symptoms: no fever     Past Medical History:  Diagnosis Date  . Arthritis   . Depression   . GERD (gastroesophageal reflux disease)   . Hepatitis    Hepititis C  . Hypertension    Past Surgical History:  Procedure Laterality Date  . COLONOSCOPY WITH PROPOFOL N/A 02/10/2016   Procedure: COLONOSCOPY WITH PROPOFOL;  Surgeon: Clarene Essex, MD;  Location: [facility];  Service: Endoscopy;  Laterality: N/A;  . ESOPHAGOGASTRODUODENOSCOPY (EGD) WITH PROPOFOL N/A 02/10/2016   Procedure: ESOPHAGOGASTRODUODENOSCOPY (EGD) WITH PROPOFOL;  Surgeon: Clarene Essex, MD;  Location: [facility];  Service: Endoscopy;  Laterality: N/A;  . WISDOM TOOTH EXTRACTION     upper right wisdom tooth   History reviewed. No pertinent family history. Social History  Substance Use Topics  . Smoking status: Former Smoker    Types: Cigars    Quit date: 12/08/2015  . Smokeless tobacco: Never Used     Comment: stopped smpoking cigarettes 7 years ago  . Alcohol use 0.0 oz/week    Review of Systems  Constitutional: Negative for fever.  HENT: Negative.   Eyes: Negative.   Respiratory: Negative.   Cardiovascular: Negative.   Gastrointestinal: Negative.   Endocrine: Negative.   Genitourinary: Negative.   Musculoskeletal:  Negative.   Skin: Positive for rash.  Allergic/Immunologic: Negative.   Neurological: Negative.   Hematological: Negative.   Psychiatric/Behavioral: Negative.   All other systems reviewed and are negative.   Allergies  Codeine  Home Medications   Prior to Admission medications   Medication Sig Start Date End Date Taking? Authorizing Provider  citalopram (CELEXA) 40 MG tablet Take 40 mg by mouth daily.    [provider]  D3 SUPER STRENGTH 2000 units CAPS Take 2,000 Units by mouth daily. 12/09/15   [provider]  lactulose (CHRONULAC) 10 GM/15ML solution Take 20 g by mouth daily.    [provider]  losartan (COZAAR) 100 MG tablet Take 100 mg by mouth daily.    [provider]  Tetrahydrozoline HCl (EYE DROPS OP) Apply 1 drop to eye daily as needed (red eyes).    [provider]   Meds Ordered and Administered this Visit  Medications - No data to display  BP (!) 148/94   Pulse 91   Temp 98.2 F (36.8 C) (Oral)   Resp 18   SpO2 95%  No data found.   Physical Exam  Constitutional: He is oriented to person, place, and time. He appears well-developed and well-nourished. He is active and cooperative.  HENT:  Head: Normocephalic.  Neck: Normal range of motion.  Cardiovascular: Normal rate.   Pulmonary/Chest: Effort normal.  Abdominal: Normal appearance.  Musculoskeletal: Normal range of motion.  Neurological: He is alert and oriented to person, place, and time. GCS eye subscore is 4. GCS verbal subscore is  5. GCS motor subscore is 6.  Skin: Rash noted. Rash is urticarial.  Excoriated skin c/w manual picking/unroofing. No appreciable burrows between fingers or webbing noted, areas are only where pt can reach to scratch  Psychiatric: His speech is normal. His mood appears anxious. He is agitated.  Nursing note and vitals reviewed.   Urgent Care Course     Procedures (including critical care time)  Labs Review Labs Reviewed -  No data to display  Imaging Review No results found.      MDM   1. Rash and nonspecific skin eruption     Discussed plan of care with pt and family. Pt has hx of anxiety/depression. Recommend follow up with PCP/dermatologist for skin scraping to confirm scabies dx. Pt ahs refill on elimite cream by PCP. Pt was inquiring of SW and assistance with MCD application, assistance and MCD form given to pt via front office staff. Please follow up with your PCP or dermatologist of your choice.Avoid heat,hot water as it makes rashes worse. Try not to pick at areas. Pt and family verbalized understanding to this provider.Pt is alert and oriented not Homicidal suicidal or delusional. DDX: neurotic dermatitis, scabies, contact dermatitis.    Tori Milks, NP 55/01/58 [phone]

## 2016-08-05 NOTE — Discharge Instructions (Addendum)
Please follow up with your PCP or dermatologist of your choice.Avoid heat,hot water as it makes rashes worse. Try not to pick at areas.

## 2016-08-05 NOTE — ED Triage Notes (Signed)
Pt here for scabies reinfection.

## 2017-10-07 ENCOUNTER — Encounter (HOSPITAL_COMMUNITY): Payer: Self-pay | Admitting: Emergency Medicine

## 2017-10-07 ENCOUNTER — Emergency Department (HOSPITAL_COMMUNITY)
Admission: EM | Admit: 2017-10-07 | Discharge: 2017-10-07 | Disposition: A | Discharge status: Home / Self Care | Payer: Medicaid Other | Attending: Emergency Medicine | Admitting: Emergency Medicine

## 2017-10-07 DIAGNOSIS — I10 Essential (primary) hypertension: Secondary | ICD-10-CM | POA: Diagnosis not present

## 2017-10-07 DIAGNOSIS — Z79899 Other long term (current) drug therapy: Secondary | ICD-10-CM | POA: Diagnosis not present

## 2017-10-07 DIAGNOSIS — F10920 Alcohol use, unspecified with intoxication, uncomplicated: Secondary | ICD-10-CM | POA: Diagnosis not present

## 2017-10-07 DIAGNOSIS — Z87891 Personal history of nicotine dependence: Secondary | ICD-10-CM | POA: Diagnosis not present

## 2017-10-07 DIAGNOSIS — M79606 Pain in leg, unspecified: Secondary | ICD-10-CM | POA: Diagnosis present

## 2017-10-07 NOTE — ED Triage Notes (Signed)
Patient was found by sheriff's deputy sitting on the side of the road with a 40oz bottle of alcohol in his hand. He was walking home from the store when he said his leg started to hurt and he became weak. Finding it hard to stand, he sat down on the side of the road in danger of traffic.

## 2017-10-07 NOTE — Discharge Instructions (Addendum)
Substance Abuse Treatment Programs ° °Intensive Outpatient Programs °[facility]     °[address]      °[place], [place]                   °[phone]      ° °[facility] °[address] °[place], [place] °[phone] ° °[facility]     °(Inpatient and outpatient)     °[address]           °[phone]   ° °[facility] °[phone] (Suboxone and Methadone) ° °[address]      °[place], [place] [address]      °[phone]      ° °[address] °[place], [place] °852-3033 ° °[facility] (Outpatient/Inpatient, [facility])    °(insurance only) [phone]      °       °[facility] (Groups & Residential) °[place], [place] °[phone] ° °   °[facility]     °[address]     °[place], [place]      °[phone]      ° °[facility] (for caregivers and family) °[address] °[place], [place] °336-299-4655 ° ° ° ° ° °Residential Treatment Programs °[facility]      °[address], [place], [place] [address]  °([phone]      ° °[facility] °[address] [place], [place] [address] °[phone] ° °[facility]        °[phone]      ° °[facility] °1-800-659-3381 ° °[facility]             °[address]                                         °[place], [place]                                                °877-615-2722 or 336-784-9470                              ° °[facility] [place] °[address] °[place], [address] °[phone] ° °[facility]    °[address]      °[place], [place]     °[phone]      ° °[facility] °[address] °, [place] °[phone] ° °[facility]   °[address]     °[place], [place] [address]     °[phone]      °Admissions: 8am-3pm M-F ° °[facility] °[address] °[place],  [place] °336-227-7417 ° °[facility]: Residential Program (90 Days)   °[place], [place]      °336-725-8389 or 800-758-6077    ° °[facility] °[place], [place] °(Walk in Hours over the weekend or by referral) ° °[facility] °[address], [place],  [address] °([phone]) [phone] ° °Crisis Mobile: [facility]:  1-877-626-1772 (for crisis response 24 hours a day) °[facility] Hotline:      1-800-256-2452 °Outpatient Psychiatry and Counseling ° °[facility]: Mobile Crisis   Management 24 hours:  1-877-626-1772 ° °[facility] sliding scale fee and walk in schedule: M-F 8am-12pm/1pm-3pm °[address]  °[place], [place] [address] °[phone] ° °[facility] °[address] °[place], [place] [address] °[phone] ° °[facility] (Former[facility] known [facility])- new patient walk-in appointments available Monday - Friday 8am -3pm.          °[address] °[place], [place] [address] °336-676-6840 or crisis line- 336-676-6905 ° °[facility] Intensive Outpatient Therapy Program °[address] °[place], [place] [address] °336-832-9804 ° °[facility]                  °Crisis Services      °[phone]      °[address]     °[place], [place] [address]                ° °[facility]   °[facility] °[phone] °[address] °[place], [place] [address] ° ° °[facility]          °[address],  °[place], [place] [address]       °([phone] ° °[facility] °[address] °[place], [place] [address] °336-292-1510 ° °[facility]    °[address]    °[place], [place] [address]     °[phone]      ° °Mark McKinney, Mark Ayala     °[address]     °[place] [address]     °[phone]     °  °[facility] °[address] °[place] [address] ° °[facility]     °[address]     °[place], [place]      °336-542-2076      ° °[facility] °Mark Ahluwalia, Mark Ayala °[address] °[place], [place] [address] °[phone] ° °[facility]     °[address]     °[place], [place] [address]     °336-274-1237      ° °[facility] °[address] °[place], [place] [address] °336-854-4450 °Resources for Temporary Residential Assistance/Crisis Centers ° °DAY CENTERS °[facility] °M-F 8am-3pm   °[address] [place], [place] [address]   336-332-0824 °Services include: laundry, barbering, support groups, case management, phone  & computer access, showers, AA/NA mtgs, mental health/substance abuse nurse, job skills class, disability information, VA assistance, spiritual classes, etc.  ° °HOMELESS SHELTERS ° °[facility]     °[facility]   °[address], [place]     °[phone]       °       °[facility] (women and children)       °[address] °[place], [place] [address] °336-275-0820 °[email or web address] for application and process °Application Required ° °[facility]   °[address]    °[place] [address]     °[phone]       °             °[facility] °[address] °[place], [place] [address] °336.273.5572 °336-235-0363(schedule application appt.) °Application Required ° °[facility] (women only)    °[address]     °[place],  [address]     °336-884-1039      °  Intake starts 6pm daily °Need valid ID, SSC, & Police report °[facility] [place] °[address] °[place], [place] °336-881-5420 °Application Required ° °[facility] (men only)     °[address]      °[place], [place]     °[phone]      ° °[facility] °(Pregnant women only) °[address] °[place], [place] °336-275-0206 ° °[facility]      °[address]      °[place], [place] [address]     °[phone]      °       °[facility] °[address] °[place], [place] °336-723-1848 °90 day commitment/SA/Application process ° °[facility](men only)     °[address]     °[place], [place]     °[phone]       °Check-in at 7pm     °       °[facility] °[address] °[place], [place] [address] °336-248-6684 °Men/Women/Women and Children must be there by 7 pm ° °[facility] °[place], [place] °336-722-8721                ° °

## 2017-10-07 NOTE — ED Provider Notes (Signed)
[facility] DEPT Provider Note   CSN: [id number] Arrival date & time: 10/07/17  0231    History   Chief Complaint Chief Complaint  Patient presents with  . Alcohol Intoxication   Level 5 caveat due to alcohol intoxication HPI Witten Certain is a 62 y.o. male.  The history is provided by the patient.  Alcohol Intoxication  This is a new problem. The problem occurs constantly. The problem has been gradually improving. The symptoms are relieved by rest.  Patient presents for presumed alcohol intoxication.  He was found by [facility] deputy on the side of the road.  He had a 40 ounce bottle of alcohol with him at the time.  He reported while walking his legs started to hurt so he sat down.  He denies any trauma.  He denies any other complaints  Past Medical History:  Diagnosis Date  . Arthritis   . Depression   . GERD (gastroesophageal reflux disease)   . Hepatitis    Hepititis C  . Hypertension     Patient Active Problem List   Diagnosis Date Noted  . Chronic hepatitis C without hepatic coma ([place]) 01/30/2014    Past Surgical History:  Procedure Laterality Date  . COLONOSCOPY WITH PROPOFOL N/A 02/10/2016   Procedure: COLONOSCOPY WITH PROPOFOL;  Surgeon: Clarene Essex, MD;  Location: [facility];  Service: Endoscopy;  Laterality: N/A;  . ESOPHAGOGASTRODUODENOSCOPY (EGD) WITH PROPOFOL N/A 02/10/2016   Procedure: ESOPHAGOGASTRODUODENOSCOPY (EGD) WITH PROPOFOL;  Surgeon: Clarene Essex, MD;  [facility];  Service: Endoscopy;  Laterality: N/A;  . WISDOM TOOTH EXTRACTION     upper right wisdom tooth        Home Medications    Prior to Admission medications   Medication Sig Start Date End Date Taking? Authorizing Provider  citalopram (CELEXA) 40 MG tablet Take 40 mg by mouth daily.    [provider]  D3 SUPER STRENGTH 2000 units CAPS Take 2,000 Units by mouth daily. 12/09/15   [provider]  lactulose (CHRONULAC) 10 GM/15ML  solution Take 20 g by mouth daily.    [provider]  losartan (COZAAR) 100 MG tablet Take 100 mg by mouth daily.    [provider]  Tetrahydrozoline HCl (EYE DROPS OP) Apply 1 drop to eye daily as needed (red eyes).    [provider]    Family History History reviewed. No pertinent family history.  Social History Social History   Tobacco Use  . Smoking status: Former Smoker    Types: Cigars    Last attempt to quit: 12/08/2015    Years since quitting: 1.8  . Smokeless tobacco: Never Used  . Tobacco comment: stopped smpoking cigarettes 7 years ago  Substance Use Topics  . Alcohol use: Yes    Alcohol/week: 0.0 oz  . Drug use: No    Comment: used in age 71's     Allergies   Codeine   Review of Systems Review of Systems  Unable to perform ROS: Mental status change  alcohol intoxication   Physical Exam Updated Vital Signs BP (!) 152/87   Pulse 79   Temp 98.7 F (37.1 C) (Oral)   Resp 16   SpO2 96%   Physical Exam CONSTITUTIONAL: Elderly, disheveled, smells of alcohol HEAD: Normocephalic/atraumatic EYES: EOMI/PERRL ENMT: Mucous membranes moist NECK: supple no meningeal signs SPINE/BACK:entire spine nontender CV: S1/S2 noted, no murmurs/rubs/gallops noted LUNGS: Lungs are clear to auscultation bilaterally, no apparent distress ABDOMEN: soft, nontender, no rebound or  guarding, bowel sounds noted throughout abdomen GU:no cva tenderness NEURO: Pt is awake/alert moves all extremitiesx4.  No facial droop.  Patient is intoxicated EXTREMITIES: pulses normal/equal, full ROM, no deformities SKIN: warm, color normal   ED Treatments / Results  Labs (all labs ordered are listed, but only abnormal results are displayed) Labs Reviewed - No data to display  EKG None  Radiology No results found.  Procedures Procedures (including critical care time)  Medications Ordered in ED Medications - No data to display   Initial Impression /  Assessment and Plan / ED Course  I have reviewed the triage vital signs and the nursing notes.       4:51 AM Patient is intoxicated, will need to be sober and will ambulate patient   Patient is awake alert and ambulating in the in the room, attempting to urinate in the sink.  Patient discharged Final Clinical Impressions(s) / ED Diagnoses   Final diagnoses:  Alcoholic intoxication without complication [facility])    ED Discharge Orders    None       Ripley Fraise, MD 10/07/17 [phone]

## 2017-10-14 ENCOUNTER — Other Ambulatory Visit: Payer: Self-pay

## 2017-10-14 ENCOUNTER — Inpatient Hospital Stay (HOSPITAL_COMMUNITY): Payer: Medicaid Other

## 2017-10-14 ENCOUNTER — Inpatient Hospital Stay (HOSPITAL_COMMUNITY)
Admission: EM | Admit: 2017-10-14 | Discharge: 2017-10-16 | DRG: 683 | Disposition: A | Discharge status: Home / Self Care | Payer: Medicaid Other | Attending: Internal Medicine | Admitting: Internal Medicine

## 2017-10-14 ENCOUNTER — Encounter (HOSPITAL_COMMUNITY): Payer: Self-pay

## 2017-10-14 DIAGNOSIS — E871 Hypo-osmolality and hyponatremia: Secondary | ICD-10-CM | POA: Diagnosis not present

## 2017-10-14 DIAGNOSIS — F329 Major depressive disorder, single episode, unspecified: Secondary | ICD-10-CM | POA: Diagnosis present

## 2017-10-14 DIAGNOSIS — E860 Dehydration: Secondary | ICD-10-CM | POA: Diagnosis present

## 2017-10-14 DIAGNOSIS — F10921 Alcohol use, unspecified with intoxication delirium: Secondary | ICD-10-CM

## 2017-10-14 DIAGNOSIS — F10129 Alcohol abuse with intoxication, unspecified: Secondary | ICD-10-CM | POA: Diagnosis not present

## 2017-10-14 DIAGNOSIS — M1990 Unspecified osteoarthritis, unspecified site: Secondary | ICD-10-CM | POA: Diagnosis present

## 2017-10-14 DIAGNOSIS — K210 Gastro-esophageal reflux disease with esophagitis: Secondary | ICD-10-CM | POA: Diagnosis not present

## 2017-10-14 DIAGNOSIS — F10121 Alcohol abuse with intoxication delirium: Secondary | ICD-10-CM | POA: Diagnosis present

## 2017-10-14 DIAGNOSIS — N179 Acute kidney failure, unspecified: Principal | ICD-10-CM | POA: Diagnosis present

## 2017-10-14 DIAGNOSIS — I272 Pulmonary hypertension, unspecified: Secondary | ICD-10-CM | POA: Diagnosis present

## 2017-10-14 DIAGNOSIS — R748 Abnormal levels of other serum enzymes: Secondary | ICD-10-CM | POA: Diagnosis present

## 2017-10-14 DIAGNOSIS — N289 Disorder of kidney and ureter, unspecified: Secondary | ICD-10-CM

## 2017-10-14 DIAGNOSIS — Y907 Blood alcohol level of 200-239 mg/100 ml: Secondary | ICD-10-CM | POA: Diagnosis present

## 2017-10-14 DIAGNOSIS — I361 Nonrheumatic tricuspid (valve) insufficiency: Secondary | ICD-10-CM | POA: Diagnosis not present

## 2017-10-14 DIAGNOSIS — B182 Chronic viral hepatitis C: Secondary | ICD-10-CM | POA: Diagnosis present

## 2017-10-14 DIAGNOSIS — I10 Essential (primary) hypertension: Secondary | ICD-10-CM | POA: Diagnosis not present

## 2017-10-14 DIAGNOSIS — R0602 Shortness of breath: Secondary | ICD-10-CM

## 2017-10-14 DIAGNOSIS — K7030 Alcoholic cirrhosis of liver without ascites: Secondary | ICD-10-CM | POA: Diagnosis present

## 2017-10-14 DIAGNOSIS — Z885 Allergy status to narcotic agent status: Secondary | ICD-10-CM

## 2017-10-14 DIAGNOSIS — Z87891 Personal history of nicotine dependence: Secondary | ICD-10-CM | POA: Diagnosis not present

## 2017-10-14 DIAGNOSIS — K219 Gastro-esophageal reflux disease without esophagitis: Secondary | ICD-10-CM | POA: Diagnosis present

## 2017-10-14 LAB — URINALYSIS, ROUTINE W REFLEX MICROSCOPIC
Bilirubin Urine: NEGATIVE
GLUCOSE, UA: NEGATIVE mg/dL
Ketones, ur: NEGATIVE mg/dL
LEUKOCYTES UA: NEGATIVE
NITRITE: NEGATIVE
PH: 5 (ref 5.0–8.0)
Protein, ur: NEGATIVE mg/dL
SPECIFIC GRAVITY, URINE: 1.008 (ref 1.005–1.030)

## 2017-10-14 LAB — ETHANOL: Alcohol, Ethyl (B): 219 mg/dL — ABNORMAL HIGH (ref <10)

## 2017-10-14 LAB — COMPREHENSIVE METABOLIC PANEL
ALBUMIN: 3 g/dL — AB (ref 3.5–5.0)
ALT: 52 U/L — ABNORMAL HIGH (ref 0–44)
AST: 90 U/L — AB (ref 15–41)
Alkaline Phosphatase: 85 U/L (ref 38–126)
Anion gap: 14 (ref 5–15)
BUN: 25 mg/dL — AB (ref 8–23)
CHLORIDE: 97 mmol/L — AB (ref 98–111)
CO2: 21 mmol/L — AB (ref 22–32)
CREATININE: 2.78 mg/dL — AB (ref 0.61–1.24)
Calcium: 8.4 mg/dL — ABNORMAL LOW (ref 8.9–10.3)
GFR calc Af Amer: 27 mL/min — ABNORMAL LOW (ref >60)
GFR calc non Af Amer: 23 mL/min — ABNORMAL LOW (ref >60)
GLUCOSE: 112 mg/dL — AB (ref 70–99)
Potassium: 4 mmol/L (ref 3.5–5.1)
SODIUM: 132 mmol/L — AB (ref 135–145)
Total Bilirubin: 2 mg/dL — ABNORMAL HIGH (ref 0.3–1.2)
Total Protein: 7.3 g/dL (ref 6.5–8.1)

## 2017-10-14 LAB — CBC
HCT: 32.8 % — ABNORMAL LOW (ref 39.0–52.0)
HEMOGLOBIN: 11.7 g/dL — AB (ref 13.0–17.0)
MCH: 34.5 pg — ABNORMAL HIGH (ref 26.0–34.0)
MCHC: 35.7 g/dL (ref 30.0–36.0)
MCV: 96.8 fL (ref 78.0–100.0)
Platelets: 152 10*3/uL (ref 150–400)
RBC: 3.39 MIL/uL — ABNORMAL LOW (ref 4.22–5.81)
RDW: 14.8 % (ref 11.5–15.5)
WBC: 8.3 10*3/uL (ref 4.0–10.5)

## 2017-10-14 LAB — AMMONIA: AMMONIA: 44 umol/L — AB (ref 9–35)

## 2017-10-14 LAB — RAPID URINE DRUG SCREEN, HOSP PERFORMED
Amphetamines: NOT DETECTED
Barbiturates: NOT DETECTED
Benzodiazepines: NOT DETECTED
Cocaine: POSITIVE — AB
Opiates: NOT DETECTED
TETRAHYDROCANNABINOL: NOT DETECTED

## 2017-10-14 MED ORDER — PANTOPRAZOLE SODIUM 40 MG PO TBEC
40.0000 mg | DELAYED_RELEASE_TABLET | Freq: Every day | ORAL | Status: DC
  Administered 2017-10-15 – 2017-10-16 (×3): 40 mg via ORAL
  Filled 2017-10-14 (×3): qty 1

## 2017-10-14 MED ORDER — ONDANSETRON HCL 4 MG PO TABS: 4.0000 mg | ORAL_TABLET | Freq: Four times a day (QID) | ORAL | Status: DC | PRN

## 2017-10-14 MED ORDER — VITAMIN B-1 100 MG PO TABS
100.0000 mg | ORAL_TABLET | Freq: Every day | ORAL | Status: DC
  Administered 2017-10-15 – 2017-10-16 (×2): 100 mg via ORAL
  Filled 2017-10-14 (×2): qty 1

## 2017-10-14 MED ORDER — HYDRALAZINE HCL 20 MG/ML IJ SOLN
10.0000 mg | Freq: Three times a day (TID) | INTRAMUSCULAR | Status: DC | PRN
  Administered 2017-10-15: 10 mg via INTRAVENOUS
  Filled 2017-10-14: qty 1

## 2017-10-14 MED ORDER — SODIUM CHLORIDE 0.9 % IV SOLN
INTRAVENOUS | Status: DC
  Administered 2017-10-14 – 2017-10-15 (×3): via INTRAVENOUS

## 2017-10-14 MED ORDER — HEPARIN SODIUM (PORCINE) 5000 UNIT/ML IJ SOLN
5000.0000 [IU] | Freq: Three times a day (TID) | INTRAMUSCULAR | Status: DC
  Administered 2017-10-15 – 2017-10-16 (×5): 5000 [IU] via SUBCUTANEOUS
  Filled 2017-10-14 (×5): qty 1

## 2017-10-14 MED ORDER — LORAZEPAM 1 MG PO TABS: 1.0000 mg | ORAL_TABLET | Freq: Four times a day (QID) | ORAL | Status: DC | PRN

## 2017-10-14 MED ORDER — SENNOSIDES-DOCUSATE SODIUM 8.6-50 MG PO TABS: 1.0000 | ORAL_TABLET | Freq: Every evening | ORAL | Status: DC | PRN

## 2017-10-14 MED ORDER — ONDANSETRON HCL 4 MG/2ML IJ SOLN
4.0000 mg | Freq: Four times a day (QID) | INTRAMUSCULAR | Status: DC | PRN
  Administered 2017-10-14: 4 mg via INTRAVENOUS
  Filled 2017-10-14: qty 2

## 2017-10-14 MED ORDER — TRAZODONE HCL 50 MG PO TABS: 50.0000 mg | ORAL_TABLET | Freq: Every evening | ORAL | Status: DC | PRN

## 2017-10-14 MED ORDER — THIAMINE HCL 100 MG/ML IJ SOLN: 100.0000 mg | Freq: Every day | INTRAMUSCULAR | Status: DC

## 2017-10-14 MED ORDER — CITALOPRAM HYDROBROMIDE 20 MG PO TABS
40.0000 mg | ORAL_TABLET | Freq: Every day | ORAL | Status: DC
  Administered 2017-10-15: 40 mg via ORAL
  Filled 2017-10-14: qty 2

## 2017-10-14 MED ORDER — FOLIC ACID 1 MG PO TABS
1.0000 mg | ORAL_TABLET | Freq: Every day | ORAL | Status: DC
  Administered 2017-10-15 – 2017-10-16 (×2): 1 mg via ORAL
  Filled 2017-10-14 (×2): qty 1

## 2017-10-14 MED ORDER — LORAZEPAM 2 MG/ML IJ SOLN: 1.0000 mg | Freq: Four times a day (QID) | INTRAMUSCULAR | Status: DC | PRN

## 2017-10-14 MED ORDER — ADULT MULTIVITAMIN W/MINERALS CH
1.0000 | ORAL_TABLET | Freq: Every day | ORAL | Status: DC
  Administered 2017-10-15 – 2017-10-16 (×2): 1 via ORAL
  Filled 2017-10-14 (×2): qty 1

## 2017-10-14 NOTE — ED Notes (Signed)
Pt had a pocket knife. Knife removed and given to security for lock up. Pt verbalizes understanding

## 2017-10-14 NOTE — ED Notes (Signed)
Pt made aware urine needed  

## 2017-10-14 NOTE — ED Notes (Signed)
Pt provided with blue scrub pants

## 2017-10-14 NOTE — ED Notes (Signed)
MD at bedside. 

## 2017-10-14 NOTE — ED Notes (Signed)
Bed: WA29 Expected date:  Expected time:  Means of arrival:  Comments: 

## 2017-10-14 NOTE — ED Provider Notes (Addendum)
[facility] DEPT Provider Note   CSN: [id number] Arrival date & time: 10/14/17  1018     History   Chief Complaint Chief Complaint  Patient presents with  . Alcohol Intoxication  . Altered Mental Status    HPI Mark Ayala is a 62 y.o. male. Level 5 caveat due to possible intoxication. HPI Patient brought in by police.  Reportedly was at the bus stop without any pants on.  States he had an accident in his pants.  States 2 boxes went  by did not pick him up.  Patient states he had $80 worth of food with him.  History of hepatitis.  States he has drank a couple beers today. Past Medical History:  Diagnosis Date  . Arthritis   . Depression   . GERD (gastroesophageal reflux disease)   . Hepatitis    Hepititis C  . Hypertension     Patient Active Problem List   Diagnosis Date Noted  . Chronic hepatitis C without hepatic coma ([place]) 01/30/2014    Past Surgical History:  Procedure Laterality Date  . COLONOSCOPY WITH PROPOFOL N/A 02/10/2016   Procedure: COLONOSCOPY WITH PROPOFOL;  Surgeon: Clarene Essex, MD;  [facility];  Service: Endoscopy;  Laterality: N/A;  . ESOPHAGOGASTRODUODENOSCOPY (EGD) WITH PROPOFOL N/A 02/10/2016   Procedure: ESOPHAGOGASTRODUODENOSCOPY (EGD) WITH PROPOFOL;  Surgeon: Clarene Essex, MD;  [facility];  Service: Endoscopy;  Laterality: N/A;  . WISDOM TOOTH EXTRACTION     upper right wisdom tooth        Home Medications    Prior to Admission medications   Medication Sig Start Date End Date Taking? Authorizing Provider  citalopram (CELEXA) 40 MG tablet Take 40 mg by mouth daily.   Yes [provider]  D3 SUPER STRENGTH 2000 units CAPS Take 2,000 Units by mouth daily. 12/09/15  Yes [provider]  losartan (COZAAR) 100 MG tablet Take 100 mg by mouth daily.   Yes [provider]  Tetrahydrozoline HCl (EYE DROPS OP) Apply 1 drop to eye daily as needed (red eyes).   Yes [provider]    Family History History reviewed. No pertinent family history.  Social History Social History   Tobacco Use  . Smoking status: Former Smoker    Types: Cigars    Last attempt to quit: 12/08/2015    Years since quitting: 1.8  . Smokeless tobacco: Never Used  . Tobacco comment: stopped smpoking cigarettes 7 years ago  Substance Use Topics  . Alcohol use: Yes    Alcohol/week: 0.0 standard drinks  . Drug use: No    Comment: used in age 78's     Allergies   Codeine   Review of Systems Review of Systems  Unable to perform ROS: Mental status change     Physical Exam Updated Vital Signs BP (!) 142/84   Pulse 100   Temp 98.5 F (36.9 C) (Oral)   Wt 81.6 kg   SpO2 97%   BMI 24.40 kg/m   Physical Exam  Constitutional: He appears well-developed.  HENT:  Head: Atraumatic.  Eyes: EOM are normal.  Cardiovascular: Normal rate.  Pulmonary/Chest: Effort normal.  Abdominal: There is no tenderness.  Musculoskeletal: He exhibits no tenderness.  Neurological: He is alert.  Patient is awake and able answer questions but somewhat confused.  Skin: Skin is warm. Capillary refill takes less than 2 seconds.     ED Treatments / Results  Labs (all labs ordered are listed, but only  abnormal results are displayed) Labs Reviewed  COMPREHENSIVE METABOLIC PANEL - Abnormal; Notable for the following components:      Result Value   Sodium 132 (*)    Chloride 97 (*)    CO2 21 (*)    Glucose, Bld 112 (*)    BUN 25 (*)    Creatinine, Ser 2.78 (*)    Calcium 8.4 (*)    Albumin 3.0 (*)    AST 90 (*)    ALT 52 (*)    Total Bilirubin 2.0 (*)    GFR calc non Af Amer 23 (*)    GFR calc Af Amer 27 (*)    All other components within normal limits  ETHANOL - Abnormal; Notable for the following components:   Alcohol, Ethyl (B) 219 (*)    All other components within normal limits  CBC - Abnormal; Notable for the following components:   RBC 3.39 (*)    Hemoglobin 11.7  (*)    HCT 32.8 (*)    MCH 34.5 (*)    All other components within normal limits  AMMONIA - Abnormal; Notable for the following components:   Ammonia 44 (*)    All other components within normal limits  RAPID URINE DRUG SCREEN, HOSP PERFORMED  URINALYSIS, ROUTINE W REFLEX MICROSCOPIC    EKG None  Radiology No results found.  Procedures Procedures (including critical care time)  Medications Ordered in ED Medications - No data to display   Initial Impression / Assessment and Plan / ED Course  I have reviewed the triage vital signs and the nursing notes.  Pertinent labs & imaging results that were available during my care of the patient were reviewed by me and considered in my medical decision making (see chart for details).     Patient brought in for altered mental status.  Has been drinking alcohol.  On recheck mental status improved somewhat.  Previous visit to ER for alcohol intoxication.  However does have a renal insufficiency with creatinine of 2.8.  Unsure of baseline.  Patient states is not been told he had a kidney problem in the past.  Does see Dr. Marlou Sa as his primary.  Urinalysis still pending.  Mental status improved. Cr was 0.9 in 2018. Will admit  Final Clinical Impressions(s) / ED Diagnoses   Final diagnoses:  Renal insufficiency  Alcohol intoxication with delirium [facility])    ED Discharge Orders    None       Davonna Belling, MD 10/14/17 [place]    Davonna Belling, MD 10/14/17 1544

## 2017-10-14 NOTE — ED Triage Notes (Signed)
Pt arrived via EMS, from home. Pt was found at the bus stop without any pants on Pt admits to ETOH use. Pt reports that he doesn't have any pants on bc he "messed in his pants" so he took them off. Pt is alert and oriented to self and situation.

## 2017-10-14 NOTE — H&P (Signed)
History and Physical  Mark Ayala NWG:[id number] DOB: 01/07/1956 DOA: 10/14/2017  Referring physician: EDP PCP: [name], MD  Outpatient Specialists:  Patient coming from: Home & is able to ambulate independently   Chief Complaint: Alcohol intoxication  HPI: Mark Ayala is a 62 y.o. male with medical history significant for alcohol abuse, chronic hepatitis C, hypertension, GERD, depression, was brought in by the police after he was found at the bus stop with no pants on.  Patient stated he had just done grocery shopping at [facility] and had been waiting for the bus to pick him up, which no bus stopped by to pick him up.  Patient stated he accidentally wet his pants and so therefore took it off.  Patient reported he had been drinking overnight about 12 cans of beer.  Of note, patient has had prior visits to the ED, with similar situation.  Last presentation in the ED was about a week ago, where police found him at the side of the road and he had a 40 ounce bottle of alcohol at that time.  Patient currently denies any abdominal pain, nausea/vomiting, fever/chills, chest pain, diarrhea, recent falls/trauma. "Patient reported that he was told that his liver was not working well and needs to quit alcohol".  Patient also reported worsening generalized weakness, with some dyspnea on exertion  ED Course:  In the ED, initially patient was noted to be intoxicated, but gradually mentation improved and he was able to have a conversation.  Alcohol level noted to be 219 on admission, creatinine noted to be 2.78, normal at baseline, elevated liver enzymes.  Patient noted to have 2+ bilateral pitting edema.  Patient admitted for further management  Review of Systems: Review of systems are otherwise negative   Past Medical History:  Diagnosis Date  . Arthritis   . Depression   . GERD (gastroesophageal reflux disease)   . Hepatitis    Hepititis C  . Hypertension    Past Surgical History:  Procedure  Laterality Date  . COLONOSCOPY WITH PROPOFOL N/A 02/10/2016   Procedure: COLONOSCOPY WITH PROPOFOL;  Surgeon: Clarene Essex, MD;  [facility];  Service: Endoscopy;  Laterality: N/A;  . ESOPHAGOGASTRODUODENOSCOPY (EGD) WITH PROPOFOL N/A 02/10/2016   Procedure: ESOPHAGOGASTRODUODENOSCOPY (EGD) WITH PROPOFOL;  Surgeon: Clarene Essex, MD;  [facility];  Service: Endoscopy;  Laterality: N/A;  . WISDOM TOOTH EXTRACTION     upper right wisdom tooth    Social History:  reports that he quit smoking about 22 months ago. His smoking use included cigars. He has never used smokeless tobacco. He reports that he drinks alcohol. He reports that he does not use drugs.   Allergies  Allergen Reactions  . Codeine Other (See Comments)    Caused Hyperactivity    History reviewed. No pertinent family history.    Prior to Admission medications   Medication Sig Start Date End Date Taking? Authorizing Provider  citalopram (CELEXA) 40 MG tablet Take 40 mg by mouth daily.   Yes [provider]  D3 SUPER STRENGTH 2000 units CAPS Take 2,000 Units by mouth daily. 12/09/15  Yes [provider]  losartan (COZAAR) 100 MG tablet Take 100 mg by mouth daily.   Yes [provider]  Tetrahydrozoline HCl (EYE DROPS OP) Apply 1 drop to eye daily as needed (red eyes).   Yes [provider]    Physical Exam: BP (!) 168/90 (BP Location: Right Arm)   Pulse 94   Temp 98 F (36.7 C) (  Oral)   Resp 16   Wt 81.6 kg   SpO2 93%   BMI 24.40 kg/m   General: NAD, alert/awake/oriented Eyes: No pallor or jaundice noted ENT: Normal Neck: Supple Cardiovascular: S1-S2 present Respiratory: Diminished breath sounds bilaterally Abdomen: Soft, obese, NT, ND, BS present Skin: Multiple scratch marks noted on both bilateral upper and lower extremities, reported chronic Musculoskeletal: 2+ pedal edema bilaterally  Psychiatric: Normal mood  Neurologic: Normal strength in all  extremities, no focal neurologic deficit noted          Labs on Admission:  Basic Metabolic Panel: Recent Labs  Lab 10/14/17 1056  NA 132*  K 4.0  CL 97*  CO2 21*  GLUCOSE 112*  BUN 25*  CREATININE 2.78*  CALCIUM 8.4*   Liver Function Tests: Recent Labs  Lab 10/14/17 1056  AST 90*  ALT 52*  ALKPHOS 85  BILITOT 2.0*  PROT 7.3  ALBUMIN 3.0*   No results for input(s): LIPASE, AMYLASE in the last 168 hours. Recent Labs  Lab 10/14/17 1054  AMMONIA 44*   CBC: Recent Labs  Lab 10/14/17 1056  WBC 8.3  HGB 11.7*  HCT 32.8*  MCV 96.8  PLT 152   Cardiac Enzymes: No results for input(s): CKTOTAL, CKMB, CKMBINDEX, TROPONINI in the last 168 hours.  BNP (last 3 results) No results for input(s): BNP in the last 8760 hours.  ProBNP (last 3 results) No results for input(s): PROBNP in the last 8760 hours.  CBG: No results for input(s): GLUCAP in the last 168 hours.  Radiological Exams on Admission: No results found.  EKG: Pending  Assessment/Plan Present on Admission: . ARF (acute renal failure) ([place]) . Chronic hepatitis C without hepatic coma ([place]) . Elevated liver enzymes . GERD (gastroesophageal reflux disease) . Essential hypertension  Principal Problem:   ARF (acute renal failure) ([facility]) Active Problems:   Chronic hepatitis C without hepatic coma ([facility])   Elevated liver enzymes   GERD (gastroesophageal reflux disease)   Essential hypertension  Alcohol intoxication Alcohol level noted to be 219 on admission Last alcohol intake was 10/13/17, about 12 cans of beer No history of DTs, seizures, intubations CIWA protocol MVT, folic acid, thiamine IV fluids Monitor very closely for withdrawal symptoms, if DTs may need to be transferred to SDU Advised to quit Social worker consult  AKI Last creatinine on file (care everywhere) from 08/2016 was about 1.3--> 2.78 ??CKD Vs dehydration ??Hepatorenal given ??hx of possible cirrhosis UA pending Renal USS  unremarkable bladder scan to assess for retention Gentle IV hydration Avoid nephrotoxics Daily BMP  Hyponatremia Likely due to ?beer potomania Gentle IV hydration  Daily BMP  Elevated liver enzymes Likely due to alcohol intoxication, ?cirrhosis Hx of chronic Hep C (reported completed treatment) CT abd/pelvis w/o contrast pending  Trend CMP  HTN Due to AKI, held home losartan IV hydralazine prn  GERD Start PPI  Polysubstance abuse Alcohol, UDS + for cocaine Advised to quit  Chronic Hep C Completed treatment as per pt  Depression Continue home celexa           DVT prophylaxis: Heparin  Code Status: Partial code  Family Communication: None at bedside  Disposition Plan: Once stable  Consults called: None  Admission status: Inpatient    Alma Friendly MD [facility]   If 7PM-7AM, please contact night-coverage [email or web address] Password [facility]  10/14/2017, 6:29 PM

## 2017-10-14 NOTE — Progress Notes (Signed)
Pt came in with groceries.  CN placed them in AD's office with the exception of frozen food items.  Frozen food items were placed in the staff lounge in a bag with patient's name on them.

## 2017-10-15 ENCOUNTER — Inpatient Hospital Stay (HOSPITAL_COMMUNITY): Payer: Medicaid Other

## 2017-10-15 DIAGNOSIS — I361 Nonrheumatic tricuspid (valve) insufficiency: Secondary | ICD-10-CM

## 2017-10-15 LAB — COMPREHENSIVE METABOLIC PANEL
ALT: 47 U/L — AB (ref 0–44)
AST: 86 U/L — AB (ref 15–41)
Albumin: 2.7 g/dL — ABNORMAL LOW (ref 3.5–5.0)
Alkaline Phosphatase: 74 U/L (ref 38–126)
Anion gap: 8 (ref 5–15)
BILIRUBIN TOTAL: 1.9 mg/dL — AB (ref 0.3–1.2)
BUN: 26 mg/dL — AB (ref 8–23)
CO2: 26 mmol/L (ref 22–32)
CREATININE: 1.91 mg/dL — AB (ref 0.61–1.24)
Calcium: 8.6 mg/dL — ABNORMAL LOW (ref 8.9–10.3)
Chloride: 108 mmol/L (ref 98–111)
GFR calc Af Amer: 42 mL/min — ABNORMAL LOW (ref >60)
GFR, EST NON AFRICAN AMERICAN: 36 mL/min — AB (ref >60)
GLUCOSE: 99 mg/dL (ref 70–99)
Potassium: 4.1 mmol/L (ref 3.5–5.1)
Sodium: 142 mmol/L (ref 135–145)
Total Protein: 6.4 g/dL — ABNORMAL LOW (ref 6.5–8.1)

## 2017-10-15 LAB — ECHOCARDIOGRAM COMPLETE: Weight: 2878.33 oz

## 2017-10-15 LAB — PROTIME-INR
INR: 1.24
Prothrombin Time: 15.5 seconds — ABNORMAL HIGH (ref 11.4–15.2)

## 2017-10-15 LAB — CBC WITH DIFFERENTIAL/PLATELET
BASOS PCT: 1 %
Basophils Absolute: 0 10*3/uL (ref 0.0–0.1)
Eosinophils Absolute: 0.1 10*3/uL (ref 0.0–0.7)
Eosinophils Relative: 2 %
HEMATOCRIT: 30.3 % — AB (ref 39.0–52.0)
HEMOGLOBIN: 10.6 g/dL — AB (ref 13.0–17.0)
LYMPHS ABS: 1.5 10*3/uL (ref 0.7–4.0)
LYMPHS PCT: 22 %
MCH: 34.1 pg — ABNORMAL HIGH (ref 26.0–34.0)
MCHC: 35 g/dL (ref 30.0–36.0)
MCV: 97.4 fL (ref 78.0–100.0)
MONOS PCT: 12 %
Monocytes Absolute: 0.8 10*3/uL (ref 0.1–1.0)
NEUTROS ABS: 4.2 10*3/uL (ref 1.7–7.7)
NEUTROS PCT: 63 %
Platelets: 122 10*3/uL — ABNORMAL LOW (ref 150–400)
RBC: 3.11 MIL/uL — ABNORMAL LOW (ref 4.22–5.81)
RDW: 15.1 % (ref 11.5–15.5)
WBC: 6.7 10*3/uL (ref 4.0–10.5)

## 2017-10-15 LAB — GLUCOSE, CAPILLARY: GLUCOSE-CAPILLARY: 149 mg/dL — AB (ref 70–99)

## 2017-10-15 LAB — HIV ANTIBODY (ROUTINE TESTING W REFLEX): HIV SCREEN 4TH GENERATION: NONREACTIVE

## 2017-10-15 LAB — BRAIN NATRIURETIC PEPTIDE: B Natriuretic Peptide: 50.4 pg/mL (ref 0.0–100.0)

## 2017-10-15 MED ORDER — AMLODIPINE BESYLATE 5 MG PO TABS
5.0000 mg | ORAL_TABLET | Freq: Every day | ORAL | Status: DC
  Administered 2017-10-15 – 2017-10-16 (×2): 5 mg via ORAL
  Filled 2017-10-15 (×2): qty 1

## 2017-10-15 MED ORDER — CITALOPRAM HYDROBROMIDE 20 MG PO TABS
20.0000 mg | ORAL_TABLET | Freq: Every day | ORAL | Status: DC
  Administered 2017-10-16: 20 mg via ORAL
  Filled 2017-10-15: qty 1

## 2017-10-15 NOTE — Clinical Social Work Note (Addendum)
Clinical Social Work Assessment  Patient Details  Name: Mark Ayala MRN: [id number] Date of Birth: April 15, 1955  Date of referral:  10/15/17               Reason for consult:  Facility Placement                Permission sought to share information with:  Facility Sport and exercise psychologist, Family Supports Permission granted to share information::  Yes, Verbal Permission Granted  Name::     [name]::     Relationship::  Daughter / Solicitor Information:     Housing/Transportation Living arrangements for the past 2 months:  Tour manager of Information:  Patient Patient Interpreter Needed:  None Criminal Activity/Legal Involvement Pertinent to Current Situation/Hospitalization:  No - Comment as needed Significant Relationships:  Adult Children, Siblings, Other Family Members Lives with:  Self Do you feel safe going back to the place where you live?  Yes Need for family participation in patient care:  No (Coment)  Care giving concerns:   Substance use. CSW met with patient at bedside, explain role and reason for visit- to provide SA resources. Patient was receptive to CSW. Patient alert and oriented. Patient express "I had a hard time yesterday, I missed the bus three times." Patient informed the CSW he had two beers yesterday while waiting on the bus, he accidentally wet his pants and then took them off. Patient reports "feeling relieved" about being in the hospital. He reports having "acid reflux" and feeling "blaoted." Patient express he has been told in the past that his Kidneys are failing him and needs to quit drinking. Patient reports the longest he has been sober was 6 months, years ago. Patient reports he only recently used cocaine (last few weeks). Patient reports this is not something he has done in the past.   Facilities manager / plan:  CSW provided the patient with list of SA resources and offered to make a follow up appointment. Patient declines CSW  assistance and reports he will make an appointment, if he decides to go to rehab.   Patient express his daughter Mark Ayala, Mark Ayala and Mark Ayala have been supportive. Patient reports he had a stroke in the past and has some memory issues.  Patient reports he lives in a boarding house and receives a Control and instrumentation engineer and food stamps monthly. Patient states his daughter plans to visit with him today and he plans to discuss SA options with her.         Plan: CSW provided SA resources.   Employment status:    Insurance information:  Medicare PT Recommendations:  [place] / Referral to community resources:  Outpatient Substance Abuse Treatment Options, Residential Substance Abuse Treatment Options  Patient/Family's Response to care:  Patient appreciative of CSW visit and "time talking with me." Patient/Family's Understanding of and Emotional Response to Diagnosis, Current Treatment, and Prognosis:  Patient Knowledgeable of his diagnosis as patient was able to describe his conversation with ED nurse and physician about his current health condition with his Kidneys. Patient states, " It(alcohol) is killing me, No I am halfway there."  Emotional Assessment Appearance:  Appears stated age Attitude/Demeanor/Rapport:    Affect (typically observed):  Accepting, Calm Orientation:  Oriented to Self, Oriented to Place, Oriented to Situation Alcohol / Substance use:  Not Applicable Psych involvement (Current and /or in the community):  No (Comment)  Discharge Needs  Concerns to be addressed:  Discharge Planning Concerns Readmission within the last 30 days:  No Current discharge risk:  Substance Abuse Barriers to Discharge:  Continued Medical Work up   [employer], LCSW 10/15/2017, 10:53 AM

## 2017-10-15 NOTE — Care Management Note (Signed)
Case Management Note  Patient Details  Name: Mancel Lardizabal MRN: [id number] Date of Birth: 11/30/1955  Subjective/Objective: Received call from dtr F[place]in [phone]-she started talking about needing to talk to me about her Dad-I had no idea of her conversation or the patient so I told her I would have someone to get back to her after I receive more information, after reviewing the notes-the social worker has already started gathering data for her assessment-she is more appropriate to talk to the dtr-social worker will contact the dtr since the patient has given her permission.                   Action/Plan:d/c back to boarding home.   Expected Discharge Date:  (unknown)               Expected Discharge Plan:     In-House Referral:     Discharge planning Services     Post Acute Care Choice:    Choice offered to:     DME Arranged:    DME Agency:     HH Arranged:    HH Agency:     Status of Service:     If discussed at [employer], dates discussed:    Additional Comments:  Dessa Phi, RN 10/15/2017, 2:39 PM

## 2017-10-15 NOTE — Progress Notes (Signed)
  Echocardiogram 2D Echocardiogram has been performed.  Mark Ayala 10/15/2017, 9:50 AM

## 2017-10-15 NOTE — Progress Notes (Signed)
PROGRESS NOTE  Mark Ayala NLZ:[id number] DOB: 04/14/1955 DOA: 10/14/2017 PCP: [name], MD  HPI/Recap of past 24 hours: Mark Ayala is a 62 y.o. male with medical history significant for alcohol abuse, chronic hepatitis C, hypertension, GERD, depression, was brought in by the police after he was found at the bus stop with no pants on.  Patient stated he had just done grocery shopping at [facility] and had been waiting for the bus to pick him up, off which non stopped. Patient stated he accidentally wet his pants and so therefore took it off.  Patient reported he had been drinking overnight about 12 cans of beer.  Of note, patient has had prior visits to the ED, with similar situation.  Last presentation in the ED was about a week ago, where police found him at the side of the road and he had a 40 ounce bottle of alcohol at that time.  Patient currently denies any abdominal pain, nausea/vomiting, fever/chills, chest pain, diarrhea, recent falls/trauma. "Patient reported that he was told that his liver was not working well and needs to quit alcohol".  Patient also reported worsening generalized weakness, with some dyspnea on exertion. In the ED, alcohol level noted to be 219, creatinine noted to be 2.78, elevated liver enzymes.  Patient noted to have 2+ bilateral pitting edema.  Patient admitted for further management.  Today, pt reported feeling better, denies any chest pain, worsening SOB, abdominal pain, diarrhea, fever/chills. Noted significant hand tremors.  Assessment/Plan: Principal Problem:   ARF (acute renal failure) (HCC) Active Problems:   Chronic hepatitis C without hepatic coma (HCC)   Elevated liver enzymes   GERD (gastroesophageal reflux disease)   Essential hypertension  Alcohol intoxication/withdrawal Alcohol level noted to be 219 on admission Last alcohol intake was 10/13/17, about 12 cans of beer No history of DTs, seizures, intubations CIWA protocol MVT, folic acid,  thiamine IV fluids Monitor very closely for withdrawal symptoms, if DTs may need to be transferred to SDU Advised to quit Social worker consult  AKI Improving s/p hydration Last creatinine on file (care everywhere) from 08/2016 was about 1.3--> 2.78 on admission Likely dehydration UA unremarkable Renal USS unremarkable Continue gentle IV hydration Avoid nephrotoxics Daily BMP  Hyponatremia Resolved Likely due to ?beer potomania Gentle IV hydration  Daily BMP  Elevated liver enzymes Likely due to alcohol intoxication, ?cirrhosis Hx of chronic Hep C (reported completed treatment) Hepatitis panel pending CT abd/pelvis w/o contrast: Cirrhotic liver without focal hepatic abnormality, cholelithiasis, mild circumferential wall thickening of the distal esophagus, likely chronic reflux esophagitis Trend CMP  HTN Due to AKI, held home losartan IV hydralazine prn, start PO norvasc  Pulm HTN BNP WNL ECHO showed EF of 65-70%, Pulm HTN with PA peak pressure of 51 mm Hg  GERD/chronic esophagitis Continue PPI  Polysubstance abuse Alcohol, UDS + for cocaine Advised to quit  Chronic Hep C Completed treatment as per pt  Depression Continue home celexa      Code Status: Partial  Family Communication: None at bedside  Disposition Plan: Likely 10/16/2017   Consultants:  None  Procedures:  None  Antimicrobials:  None  DVT prophylaxis: Heparin   Objective: Vitals:   10/14/17 1635 10/14/17 1708 10/15/17 0016 10/15/17 0519  BP: (!) 152/88 (!) 168/90 (!) 171/99 (!) 174/94  Pulse: 97 94 89 93  Resp: 18 16 16 20   Temp: 98 F (36.7 C) 98 F (36.7 C) 98.5 F (36.9 C) 98.3 F (36.8 C)  TempSrc: Oral  Oral Oral Oral  SpO2: 96% 93% 96% 97%  Weight:        Intake/Output Summary (Last 24 hours) at 10/15/2017 1346 Last data filed at 10/15/2017 1014 Gross per 24 hour  Intake 975.94 ml  Output -  Net 975.94 ml   Filed Weights   10/14/17 1043  Weight:  81.6 kg    Exam:   General: NAD  Cardiovascular: S1, S2 present  Respiratory: Diminished breath sounds bilaterally  Abdomen: Soft, obese, nontender, nondistended, bowel sounds present  Musculoskeletal: Trace pedal edema bilaterally  Skin: Multiple scratch marks noted on both bilateral upper and lower extremities, reported chronic  Psychiatry: Normal mood   Data Reviewed: CBC: Recent Labs  Lab 10/14/17 1056 10/15/17 0411  WBC 8.3 6.7  NEUTROABS  --  4.2  HGB 11.7* 10.6*  HCT 32.8* 30.3*  MCV 96.8 97.4  PLT 152 209*   Basic Metabolic Panel: Recent Labs  Lab 10/14/17 1056 10/15/17 0411  NA 132* 142  K 4.0 4.1  CL 97* 108  CO2 21* 26  GLUCOSE 112* 99  BUN 25* 26*  CREATININE 2.78* 1.91*  CALCIUM 8.4* 8.6*   GFR: CrCl cannot be calculated (Unknown ideal weight.). Liver Function Tests: Recent Labs  Lab 10/14/17 1056 10/15/17 0411  AST 90* 86*  ALT 52* 47*  ALKPHOS 85 74  BILITOT 2.0* 1.9*  PROT 7.3 6.4*  ALBUMIN 3.0* 2.7*   No results for input(s): LIPASE, AMYLASE in the last 168 hours. Recent Labs  Lab 10/14/17 1054  AMMONIA 44*   Coagulation Profile: Recent Labs  Lab 10/15/17 0411  INR 1.24   Cardiac Enzymes: No results for input(s): CKTOTAL, CKMB, CKMBINDEX, TROPONINI in the last 168 hours. BNP (last 3 results) No results for input(s): PROBNP in the last 8760 hours. HbA1C: No results for input(s): HGBA1C in the last 72 hours. CBG: Recent Labs  Lab 10/15/17 0852  GLUCAP 149*   Lipid Profile: No results for input(s): CHOL, HDL, LDLCALC, TRIG, CHOLHDL, LDLDIRECT in the last 72 hours. Thyroid Function Tests: No results for input(s): TSH, T4TOTAL, FREET4, T3FREE, THYROIDAB in the last 72 hours. Anemia Panel: No results for input(s): VITAMINB12, FOLATE, FERRITIN, TIBC, IRON, RETICCTPCT in the last 72 hours. Urine analysis:    Component Value Date/Time   COLORURINE YELLOW 10/14/2017 [place] 10/14/2017 1617    LABSPEC 1.008 10/14/2017 1617   PHURINE 5.0 10/14/2017 1617   GLUCOSEU NEGATIVE 10/14/2017 1617   HGBUR MODERATE (A) 10/14/2017 1617   BILIRUBINUR NEGATIVE 10/14/2017 1617   KETONESUR NEGATIVE 10/14/2017 1617   PROTEINUR NEGATIVE 10/14/2017 1617   NITRITE NEGATIVE 10/14/2017 1617   LEUKOCYTESUR NEGATIVE 10/14/2017 1617   Sepsis Labs: @LABRCNTIP (procalcitonin:4,lacticidven:4)  )No results found for this or any previous visit (from the past 240 hour(s)).    Studies: Ct Abdomen Pelvis Wo Contrast  Result Date: 10/14/2017 CLINICAL DATA:  Cirrhosis. EXAM: CT ABDOMEN AND PELVIS WITHOUT CONTRAST TECHNIQUE: Multidetector CT imaging of the abdomen and pelvis was performed following the standard protocol without IV contrast. COMPARISON:  Abdominal ultrasound dated February 15, 2014. FINDINGS: Lower chest: The lung bases are clear. Mild circumferential wall thickening of the distal esophagus. Hepatobiliary: Nodular liver contour, consistent with cirrhosis. No focal liver abnormality. Contracted gallbladder containing a small gallstone. No biliary dilatation. Pancreas: Unremarkable. No pancreatic ductal dilatation or surrounding inflammatory changes. Spleen: Normal in size without focal abnormality. Adrenals/Urinary Tract: Adrenal glands are unremarkable. Kidneys are normal, without renal calculi, focal lesion, or hydronephrosis. Bladder is unremarkable.  Stomach/Bowel: Stomach is within normal limits. Appendix appears normal. No evidence of bowel wall thickening, distention, or inflammatory changes. Vascular/Lymphatic: Aortic atherosclerosis. No enlarged abdominal or pelvic lymph nodes. Reproductive: Prostate is unremarkable. Other: Trace ascites in the pelvis. Tiny fat containing umbilical hernia. No pneumoperitoneum. Musculoskeletal: No acute or significant osseous findings. Degenerative changes of the lumbar spine with trace L3-L4 and 6 mm L4-L5 anterolisthesis due to facet arthropathy. IMPRESSION: 1.  Cirrhotic liver without focal hepatic abnormality, although evaluation is limited without intravenous contrast. 2. Cholelithiasis. 3. Mild circumferential wall thickening of the distal esophagus is nonspecific, but could reflect chronic reflux esophagitis. 4.  Aortic atherosclerosis (ICD10-I70.0). Electronically Signed   By: Titus Dubin M.D.   On: 10/14/2017 19:09   US Renal  Result Date: 10/14/2017 CLINICAL DATA:  Acute kidney injury. EXAM: RENAL / URINARY TRACT ULTRASOUND COMPLETE COMPARISON:  None. FINDINGS: Right Kidney: Length: 12.1 cm. Echogenicity within normal limits. No mass or hydronephrosis visualized. Left Kidney: Length: 13.1 cm. Echogenicity within normal limits. No mass or hydronephrosis visualized. Bladder: Appears normal for degree of bladder distention. IMPRESSION: Normal renal ultrasound. Electronically Signed   By: Titus Dubin M.D.   On: 10/14/2017 18:53   Dg Chest Port 1 View  Result Date: 10/14/2017 CLINICAL DATA:  Shortness of breath.  Former smoker. EXAM: PORTABLE CHEST 1 VIEW COMPARISON:  Chest x-ray dated 09/04/2016. FINDINGS: Mild cardiomegaly, heart size likely accentuated by the lordotic patient positioning. Mediastinal contours appear grossly stable. Lungs are clear. No pleural effusion or pneumothorax seen. No acute or suspicious osseous finding. Scoliosis of the thoracic spine. IMPRESSION: No active disease.  No evidence of pneumonia or pulmonary edema. Electronically Signed   By: Franki Cabot M.D.   On: 10/14/2017 19:17    Scheduled Meds: . citalopram  40 mg Oral Daily  . folic acid  1 mg Oral Daily  . heparin  5,000 Units Subcutaneous Q8H  . multivitamin with minerals  1 tablet Oral Daily  . pantoprazole  40 mg Oral Daily  . thiamine  100 mg Oral Daily   Or  . thiamine  100 mg Intravenous Daily    Continuous Infusions: . sodium chloride 75 mL/hr at 10/15/17 0838     LOS: 1 day     Alma Friendly, MD [facility]  If 7PM-7AM, please  contact night-coverage [email or web address] Password [email or web address] 10/15/2017, 1:46 PM

## 2017-10-16 LAB — COMPREHENSIVE METABOLIC PANEL
ALBUMIN: 2.5 g/dL — AB (ref 3.5–5.0)
ALK PHOS: 70 U/L (ref 38–126)
ALT: 41 U/L (ref 0–44)
ANION GAP: 8 (ref 5–15)
AST: 74 U/L — ABNORMAL HIGH (ref 15–41)
BILIRUBIN TOTAL: 1.9 mg/dL — AB (ref 0.3–1.2)
BUN: 18 mg/dL (ref 8–23)
CALCIUM: 8.2 mg/dL — AB (ref 8.9–10.3)
CO2: 26 mmol/L (ref 22–32)
Chloride: 104 mmol/L (ref 98–111)
Creatinine, Ser: 1.1 mg/dL (ref 0.61–1.24)
GFR calc Af Amer: >60 mL/min (ref >60)
GFR calc non Af Amer: >60 mL/min (ref >60)
GLUCOSE: 94 mg/dL (ref 70–99)
Potassium: 3.7 mmol/L (ref 3.5–5.1)
Sodium: 138 mmol/L (ref 135–145)
TOTAL PROTEIN: 6.1 g/dL — AB (ref 6.5–8.1)

## 2017-10-16 LAB — CBC WITH DIFFERENTIAL/PLATELET
BASOS ABS: 0 10*3/uL (ref 0.0–0.1)
BASOS PCT: 1 %
EOS ABS: 0.3 10*3/uL (ref 0.0–0.7)
EOS PCT: 5 %
HEMATOCRIT: 30.2 % — AB (ref 39.0–52.0)
Hemoglobin: 10.4 g/dL — ABNORMAL LOW (ref 13.0–17.0)
Lymphocytes Relative: 32 %
Lymphs Abs: 1.6 10*3/uL (ref 0.7–4.0)
MCH: 34.6 pg — ABNORMAL HIGH (ref 26.0–34.0)
MCHC: 34.4 g/dL (ref 30.0–36.0)
MCV: 100.3 fL — ABNORMAL HIGH (ref 78.0–100.0)
Monocytes Absolute: 0.8 10*3/uL (ref 0.1–1.0)
Monocytes Relative: 15 %
Neutro Abs: 2.4 10*3/uL (ref 1.7–7.7)
Neutrophils Relative %: 47 %
PLATELETS: 108 10*3/uL — AB (ref 150–400)
RBC: 3.01 MIL/uL — ABNORMAL LOW (ref 4.22–5.81)
RDW: 15.8 % — AB (ref 11.5–15.5)
WBC: 5.1 10*3/uL (ref 4.0–10.5)

## 2017-10-16 LAB — GLUCOSE, CAPILLARY: GLUCOSE-CAPILLARY: 94 mg/dL (ref 70–99)

## 2017-10-16 MED ORDER — THIAMINE HCL 100 MG PO TABS: 100.0000 mg | ORAL_TABLET | Freq: Every day | ORAL | 0 refills | Status: AC

## 2017-10-16 MED ORDER — PANTOPRAZOLE SODIUM 40 MG PO TBEC: 40.0000 mg | DELAYED_RELEASE_TABLET | Freq: Every day | ORAL | 0 refills | Status: AC

## 2017-10-16 MED ORDER — LOSARTAN POTASSIUM 100 MG PO TABS: 100.0000 mg | ORAL_TABLET | Freq: Every day | ORAL | 0 refills | Status: DC

## 2017-10-16 MED ORDER — FOLIC ACID 1 MG PO TABS: 1.0000 mg | ORAL_TABLET | Freq: Every day | ORAL | 0 refills | Status: AC

## 2017-10-16 MED ORDER — CITALOPRAM HYDROBROMIDE 40 MG PO TABS: 20.0000 mg | ORAL_TABLET | Freq: Every day | ORAL | 0 refills | Status: DC

## 2017-10-16 NOTE — Discharge Summary (Signed)
Discharge Summary  Mark Ayala DJM:[id number] DOB: 11-Sep-1955  PCP: [name], MD  Admit date: 10/14/2017 Discharge date: 10/16/2017  Time spent: 40 mins  Recommendations for Outpatient Follow-up:  1. PCP  Discharge Diagnoses:  Active Hospital Problems   Diagnosis Date Noted  . ARF (acute renal failure) ([place]) 10/14/2017  . Elevated liver enzymes 10/14/2017  . GERD (gastroesophageal reflux disease) 10/14/2017  . Essential hypertension 10/14/2017  . Chronic hepatitis C without hepatic coma ([place]) 01/30/2014    Resolved Hospital Problems  No resolved problems to display.    Discharge Condition: Stable  Diet recommendation: Heart healthy  Vitals:   10/16/17 0533 10/16/17 1046  BP: (!) 157/85 (!) 151/82  Pulse: 72 80  Resp: 20   Temp: 98.8 F (37.1 C) 98.5 F (36.9 C)  SpO2: 96% 97%    History of present illness:  Mark Ayala a 61 y.o.malewith medical history significant foralcohol abuse, chronic hepatitis C, hypertension, GERD, depression, was brought in by the police after he was found at the bus stop with no pants on.Patient stated he had just done grocery shopping at [facility] and had been waiting for the bus to pick him up,off which non stopped. Patient stated he accidentally wet his pants and so therefore took it off. Patient reported he hadbeen drinking overnight about 12 cans of beer.Of note, patient has had prior visits to the ED, with similar situation. Last presentation in the ED was about a week ago, where police found him at the side of the road and he had a 40 ounce bottle of alcohol at that time. Patient currently denies any abdominal pain, nausea/vomiting, fever/chills, chest pain, diarrhea, recent falls/trauma. "Patient reported that he was told that his liver was not workingwell and needs to quit alcohol".Patient also reported worsening generalized weakness, with some dyspnea on exertion. In the ED, alcohol level noted to be 219, creatinine noted  to be 2.78, elevated liver enzymes.Patient noted to have 2+ bilateral pitting edema. Patient admitted for further management.  Today, pt reported feeling better, denies any chest pain, worsening SOB, abdominal pain, diarrhea, fever/chills. Discussed in details with daughter and her mother. Daughter concerned about chronic multiple scabs present on upper and lower extremities, advised to follow up with PCP for a possible dermatology referral.    Hospital Course:  Principal Problem:   ARF (acute renal failure) ([place]) Active Problems:   Chronic hepatitis C without hepatic coma ([facility])   Elevated liver enzymes   GERD (gastroesophageal reflux disease)   Essential hypertension  Alcohol intoxication/withdrawal Alcohol level noted to be219on admission Last alcohol intake was8/7/19,about 12 cans of beer No history of DTs, seizures, intubations S/P CIWA protocol Discharge on MVT, folic acid, thiamine Advised to quit Social worker consulted- See detailed note  AKI Resolved s/p IV hydration Last creatinine on file(care everywhere) from 08/2016 was about 1.3--> 2.78 on admission Likely 2/2 dehydration UA unremarkable Renal USS unremarkable  Hyponatremia Resolved s/p hydration Likely due to ?beer potomania  Elevated liver enzymes Likely due to alcohol intoxication, ?cirrhosis Hx of chronic Hep C (reported completed treatment) Hepatitis panel pending, PCP to follow up CT abd/pelvis w/o contrast: Cirrhotic liver without focal hepatic abnormality, cholelithiasis, mild circumferential wall thickening of the distal esophagus, likely chronic reflux esophagitis PCP to follow up  HTN Continue home losartan  Pulm HTN BNP WNL ECHO showed EF of 65-70%, Pulm HTN with PA peak pressure of 51 mm Hg PCP to follow up  GERD/chronic esophagitis Continue PPI  Polysubstance abuse Alcohol,  UDS + for cocaine Advised to quit SW consulted  Chronic Hep C Completed treatment as per  pt  Depression Continue home celexa, reduced dose to 20 mg daily to avoid QTc prolongation in pt >60 yrs    Procedures:  None  Consultations:  None  Discharge Exam: BP (!) 151/82 (BP Location: Right Arm)   Pulse 80   Temp 98.5 F (36.9 C) (Oral)   Resp 20   Wt 81.6 kg   SpO2 97%   BMI 24.40 kg/m   General: NAD  Cardiovascular: S1, S2 present Respiratory: CTAB  Discharge Instructions You were cared for by a hospitalist during your hospital stay. If you have any questions about your discharge medications or the care you received while you were in the hospital after you are discharged, you can call the unit and asked to speak with the hospitalist on call if the hospitalist that took care of you is not available. Once you are discharged, your primary care physician will handle any further medical issues. Please note that NO REFILLS for any discharge medications will be authorized once you are discharged, as it is imperative that you return to your primary care physician (or establish a relationship with a primary care physician if you do not have one) for your aftercare needs so that they can reassess your need for medications and monitor your lab values.  Discharge Instructions    Diet - low sodium heart healthy   Complete by:  As directed    Increase activity slowly   Complete by:  As directed      Allergies as of 10/16/2017      Reactions   Codeine Other (See Comments)   Caused Hyperactivity      Medication List    TAKE these medications   citalopram 40 MG tablet Commonly known as:  CELEXA Take 0.5 tablets (20 mg total) by mouth daily. What changed:  how much to take   D3 SUPER STRENGTH 2000 units Caps Generic drug:  Cholecalciferol Take 2,000 Units by mouth daily.   EYE DROPS OP Apply 1 drop to eye daily as needed (red eyes).   folic acid 1 MG tablet Commonly known as:  FOLVITE Take 1 tablet (1 mg total) by mouth daily. Start taking on:  10/17/2017    losartan 100 MG tablet Commonly known as:  COZAAR Take 1 tablet (100 mg total) by mouth daily.   pantoprazole 40 MG tablet Commonly known as:  PROTONIX Take 1 tablet (40 mg total) by mouth daily. Start taking on:  10/17/2017   thiamine 100 MG tablet Take 1 tablet (100 mg total) by mouth daily. Start taking on:  10/17/2017      Allergies  Allergen Reactions  . Codeine Other (See Comments)    Caused Hyperactivity   Follow-up Information    Schedule an appointment as soon as possible for a visit  with [name], MD.   Specialty:  Internal Medicine Contact information: [address] [place] [address] [phone]            The results of significant diagnostics from this hospitalization (including imaging, microbiology, ancillary and laboratory) are listed below for reference.    Significant Diagnostic Studies: Ct Abdomen Pelvis Wo Contrast  Result Date: 10/14/2017 CLINICAL DATA:  Cirrhosis. EXAM: CT ABDOMEN AND PELVIS WITHOUT CONTRAST TECHNIQUE: Multidetector CT imaging of the abdomen and pelvis was performed following the standard protocol without IV contrast. COMPARISON:  Abdominal ultrasound dated February 15, 2014. FINDINGS: Lower chest: The lung bases are clear. Mild circumferential wall thickening of the distal esophagus. Hepatobiliary: Nodular liver contour, consistent with cirrhosis. No focal liver abnormality. Contracted gallbladder containing a small gallstone. No biliary dilatation. Pancreas: Unremarkable. No pancreatic ductal dilatation or surrounding inflammatory changes. Spleen: Normal in size without focal abnormality. Adrenals/Urinary Tract: Adrenal glands are unremarkable. Kidneys are normal, without renal calculi, focal lesion, or hydronephrosis. Bladder is unremarkable. Stomach/Bowel: Stomach is within normal limits. Appendix appears normal. No evidence of bowel wall thickening, distention, or inflammatory changes. Vascular/Lymphatic: Aortic  atherosclerosis. No enlarged abdominal or pelvic lymph nodes. Reproductive: Prostate is unremarkable. Other: Trace ascites in the pelvis. Tiny fat containing umbilical hernia. No pneumoperitoneum. Musculoskeletal: No acute or significant osseous findings. Degenerative changes of the lumbar spine with trace L3-L4 and 6 mm L4-L5 anterolisthesis due to facet arthropathy. IMPRESSION: 1. Cirrhotic liver without focal hepatic abnormality, although evaluation is limited without intravenous contrast. 2. Cholelithiasis. 3. Mild circumferential wall thickening of the distal esophagus is nonspecific, but could reflect chronic reflux esophagitis. 4.  Aortic atherosclerosis (ICD10-I70.0). Electronically Signed   By: Titus Dubin M.D.   On: 10/14/2017 19:09   US Renal  Result Date: 10/14/2017 CLINICAL DATA:  Acute kidney injury. EXAM: RENAL / URINARY TRACT ULTRASOUND COMPLETE COMPARISON:  None. FINDINGS: Right Kidney: Length: 12.1 cm. Echogenicity within normal limits. No mass or hydronephrosis visualized. Left Kidney: Length: 13.1 cm. Echogenicity within normal limits. No mass or hydronephrosis visualized. Bladder: Appears normal for degree of bladder distention. IMPRESSION: Normal renal ultrasound. Electronically Signed   By: Titus Dubin M.D.   On: 10/14/2017 18:53   Dg Chest Port 1 View  Result Date: 10/14/2017 CLINICAL DATA:  Shortness of breath.  Former smoker. EXAM: PORTABLE CHEST 1 VIEW COMPARISON:  Chest x-ray dated 09/04/2016. FINDINGS: Mild cardiomegaly, heart size likely accentuated by the lordotic patient positioning. Mediastinal contours appear grossly stable. Lungs are clear. No pleural effusion or pneumothorax seen. No acute or suspicious osseous finding. Scoliosis of the thoracic spine. IMPRESSION: No active disease.  No evidence of pneumonia or pulmonary edema. Electronically Signed   By: Franki Cabot M.D.   On: 10/14/2017 19:17    Microbiology: No results found for this or any previous visit  (from the past 240 hour(s)).   Labs: Basic Metabolic Panel: Recent Labs  Lab 10/14/17 1056 10/15/17 0411 10/16/17 0538  NA 132* 142 138  K 4.0 4.1 3.7  CL 97* 108 104  CO2 21* 26 26  GLUCOSE 112* 99 94  BUN 25* 26* 18  CREATININE 2.78* 1.91* 1.10  CALCIUM 8.4* 8.6* 8.2*   Liver Function Tests: Recent Labs  Lab 10/14/17 1056 10/15/17 0411 10/16/17 0538  AST 90* 86* 74*  ALT 52* 47* 41  ALKPHOS 85 74 70  BILITOT 2.0* 1.9* 1.9*  PROT 7.3 6.4* 6.1*  ALBUMIN 3.0* 2.7* 2.5*   No results for input(s): LIPASE, AMYLASE in the last 168 hours. Recent Labs  Lab 10/14/17 1054  AMMONIA 44*   CBC: Recent Labs  Lab 10/14/17 1056 10/15/17 0411 10/16/17 0538  WBC 8.3 6.7 5.1  NEUTROABS  --  4.2 2.4  HGB 11.7* 10.6* 10.4*  HCT 32.8* 30.3* 30.2*  MCV 96.8 97.4 100.3*  PLT 152 122* 108*   Cardiac Enzymes: No results for input(s): CKTOTAL, CKMB, CKMBINDEX, TROPONINI in the last 168 hours. BNP: BNP (last 3 results) Recent Labs    10/15/17 0411  BNP 50.4    ProBNP (last 3 results) No results for  input(s): PROBNP in the last 8760 hours.  CBG: Recent Labs  Lab 10/15/17 0852 10/16/17 0745  GLUCAP 149* 94       Signed:  Alma Friendly, MD [facility] 10/16/2017, 11:27 AM

## 2017-10-16 NOTE — Evaluation (Signed)
Physical Therapy Evaluation Patient Details Name: Mark Ayala MRN: [id number] DOB: 1955-12-07 Today's Date: 10/16/2017   History of Present Illness  Joushua Dugar is a 62 y.o. male adm with ETOH level of 219, weakness and DOE; medical history significant for alcohol abuse, chronic hepatitis C, hypertension, GERD, depression;  Clinical Impression  Pt presents with delayed balance reactions and minimally unsteady gait which improved with distance; Pt with baseline compensatory techniques for balance/ccordination deficits;  pt should be able to return to his prior living situation; He was able to walk 400' without AD and min/guard for safety; He remains mildly impulsive; would benefit from continued amb with nursing staff; PT will follow in acute setting    Follow Up Recommendations No PT follow up    Equipment Recommendations  None recommended by PT    Recommendations for Other Services       Precautions / Restrictions Precautions Precautions: Fall      Mobility  Bed Mobility Overal bed mobility: Needs Assistance Bed Mobility: Supine to Sit     Supine to sit: Supervision     General bed mobility comments: for safety d/t impulsivity  Transfers Overall transfer level: Needs assistance   Transfers: Sit to/from Stand Sit to Stand: Min guard         General transfer comment: for safety and balance  Ambulation/Gait Ambulation/Gait assistance: Min guard Gait Distance (Feet): 400 Feet Assistive device: None Gait Pattern/deviations: Step-through pattern;Wide base of support;Drifts right/left;Decreased stride length     General Gait Details: pt with unsteady gait initially, stability improved with distance; moderate drifting and pt compensating for deficits with wide BOS and decr stride; reguiring min/guard for safety  Stairs            Wheelchair Mobility    Modified Rankin (Stroke Patients Only)       Balance Overall balance assessment: Needs  assistance(denies falls) Sitting-balance support: No upper extremity supported;Feet supported Sitting balance-Leahy Scale: Good     Standing balance support: During functional activity;No upper extremity supported Standing balance-Leahy Scale: Fair Standing balance comment: delayed balance reactions, only tested with min challenges             High level balance activites: Backward walking;Direction changes;Head turns High Level Balance Comments: no overt LOB with above; able to pick up object from floor with min/guard to close supervision             Pertinent Vitals/Pain Pain Assessment: No/denies pain    Home Living Family/patient expects to be discharged to:: Other (Comment)(boarding house) Living Arrangements: Other (Comment)   Type of Home: House(rents a room in boarding house) Home Access: Stairs to enter Entrance Stairs-Rails: Right Entrance Stairs-Number of Steps: 3 Home Layout: Able to live on main level with bedroom/bathroom Home Equipment: None      Prior Function Level of Independence: Independent               Hand Dominance        Extremity/Trunk Assessment   Upper Extremity Assessment Upper Extremity Assessment: Overall WFL for tasks assessed    Lower Extremity Assessment RLE Deficits / Details: LEs at least 3+/5; muscle fatigue with activity RLE Coordination: decreased fine motor LLE Deficits / Details: LEs at least 3+/5; muscle fatigue with activity LLE Coordination: decreased fine motor       Communication   Communication: No difficulties  Cognition Arousal/Alertness: Awake/alert Behavior During Therapy: Impulsive Overall Cognitive Status: Within Functional Limits for tasks assessed  General Comments: pt mildly impulsive, moves very quickly; responsive to safety cues      General Comments      Exercises     Assessment/Plan    PT Assessment Patient needs continued PT  services  PT Problem List Decreased activity tolerance;Decreased balance;Decreased coordination       PT Treatment Interventions Gait training;Functional mobility training;Therapeutic activities;Therapeutic exercise;Patient/family education;Balance training;Stair training    PT Goals (Current goals can be found in the Care Plan section)  Acute Rehab PT Goals Patient Stated Goal: get a little stronger PT Goal Formulation: With patient Time For Goal Achievement: 10/30/17 Potential to Achieve Goals: Good    Frequency Min 3X/week   Barriers to discharge        Co-evaluation               AM-PAC PT "6 Clicks" Daily Activity  Outcome Measure Difficulty turning over in bed (including adjusting bedclothes, sheets and blankets)?: A Little Difficulty moving from lying on back to sitting on the side of the bed? : A Little Difficulty sitting down on and standing up from a chair with arms (e.g., wheelchair, bedside commode, etc,.)?: Unable Help needed moving to and from a bed to chair (including a wheelchair)?: A Little Help needed walking in hospital room?: A Little Help needed climbing 3-5 steps with a railing? : A Little 6 Click Score: 16    End of Session Equipment Utilized During Treatment: Gait belt Activity Tolerance: Patient tolerated treatment well Patient left: in bed;with call bell/phone within reach;with family/visitor present;with bed alarm set;Other (comment)(telesitter) Nurse Communication: Mobility status PT Visit Diagnosis: Unsteadiness on feet (R26.81)    Time: 7026-3785 PT Time Calculation (min) (ACUTE ONLY): 22 min   Charges:   PT Evaluation $PT Eval Low Complexity: 1 Low          Kenyon Ana, PT Pager: [phone] 10/16/2017   [facility] 10/16/2017, 10:29 AM

## 2017-10-16 NOTE — Progress Notes (Signed)
Discharged Mark Ayala today. Reviewed medications. Educated patient on importance of taking medications & prescribed medications. Told patient to pick up prescriptions at [facility]. Advised patient to make follow up appointment with Dr. Kevan Ny asap. Groceries brought up by [facility] & took patient to security to obtain pocket knife.

## 2017-10-17 LAB — HEPATITIS PANEL, ACUTE
HEP A IGM: NEGATIVE
HEP B C IGM: NEGATIVE
Hepatitis B Surface Ag: NEGATIVE

## 2017-12-09 ENCOUNTER — Encounter (HOSPITAL_BASED_OUTPATIENT_CLINIC_OR_DEPARTMENT_OTHER): Payer: Self-pay | Admitting: *Deleted

## 2017-12-09 ENCOUNTER — Emergency Department (HOSPITAL_BASED_OUTPATIENT_CLINIC_OR_DEPARTMENT_OTHER): Payer: Medicaid Other

## 2017-12-09 ENCOUNTER — Other Ambulatory Visit: Payer: Self-pay

## 2017-12-09 DIAGNOSIS — I10 Essential (primary) hypertension: Secondary | ICD-10-CM | POA: Insufficient documentation

## 2017-12-09 DIAGNOSIS — M25512 Pain in left shoulder: Secondary | ICD-10-CM | POA: Diagnosis present

## 2017-12-09 DIAGNOSIS — Z87891 Personal history of nicotine dependence: Secondary | ICD-10-CM | POA: Diagnosis not present

## 2017-12-09 DIAGNOSIS — M25412 Effusion, left shoulder: Secondary | ICD-10-CM | POA: Diagnosis not present

## 2017-12-09 DIAGNOSIS — Z79899 Other long term (current) drug therapy: Secondary | ICD-10-CM | POA: Insufficient documentation

## 2017-12-09 NOTE — ED Triage Notes (Signed)
Left shoulder injury. He was lifting a chair tonight and had the pain. Hx of rotator cuff tear. He is from [facility].

## 2017-12-10 ENCOUNTER — Emergency Department (HOSPITAL_BASED_OUTPATIENT_CLINIC_OR_DEPARTMENT_OTHER)
Admission: EM | Admit: 2017-12-10 | Discharge: 2017-12-10 | Disposition: A | Discharge status: Home / Self Care | Payer: Medicaid Other | Attending: Emergency Medicine | Admitting: Emergency Medicine

## 2017-12-10 DIAGNOSIS — S4992XA Unspecified injury of left shoulder and upper arm, initial encounter: Secondary | ICD-10-CM

## 2017-12-10 DIAGNOSIS — M25412 Effusion, left shoulder: Secondary | ICD-10-CM

## 2017-12-10 MED ORDER — IBUPROFEN 800 MG PO TABS
800.0000 mg | ORAL_TABLET | Freq: Once | ORAL | Status: AC
  Administered 2017-12-10: 800 mg via ORAL
  Filled 2017-12-10: qty 1

## 2017-12-10 MED ORDER — IBUPROFEN 600 MG PO TABS: 600.0000 mg | ORAL_TABLET | Freq: Four times a day (QID) | ORAL | 0 refills | Status: DC

## 2017-12-10 MED ORDER — ACETAMINOPHEN 325 MG PO TABS
650.0000 mg | ORAL_TABLET | Freq: Once | ORAL | Status: AC
  Administered 2017-12-10: 650 mg via ORAL
  Filled 2017-12-10: qty 2

## 2017-12-10 NOTE — Discharge Instructions (Addendum)
Apply ice for thirty minutes, four times a day.  Wear sling as needed.  Take acetaminophen 650 mg four times a day, ibuprofen 600 mg four times a day.

## 2017-12-10 NOTE — ED Provider Notes (Signed)
[place] EMERGENCY DEPARTMENT Provider Note   CSN: [id number] Arrival date & time: 12/09/17  2120     History   Chief Complaint Chief Complaint  Patient presents with  . Shoulder Injury    HPI Mark Ayala is a 62 y.o. male.  The history is provided by the patient.  He has history of GERD, hypertension, hepatitis C and comes in having injured his left shoulder.  He has a history of a rotator cuff injury to that shoulder, and had lifted a chair.  He did not notice pain initially, but pain in left shoulder is gotten progressively worse through the day.  He rates pain a 10/10.  Is worse with any movement.  He has taken ibuprofen with little relief of pain.  Of note, he does have a history of narcotic abuse and I wants to avoid all narcotics.  Past Medical History:  Diagnosis Date  . Arthritis   . Depression   . GERD (gastroesophageal reflux disease)   . Hepatitis    Hepititis C  . Hypertension     Patient Active Problem List   Diagnosis Date Noted  . ARF (acute renal failure) ([place]) 10/14/2017  . Elevated liver enzymes 10/14/2017  . GERD (gastroesophageal reflux disease) 10/14/2017  . Essential hypertension 10/14/2017  . Chronic hepatitis C without hepatic coma ([place]) 01/30/2014    Past Surgical History:  Procedure Laterality Date  . COLONOSCOPY WITH PROPOFOL N/A 02/10/2016   Procedure: COLONOSCOPY WITH PROPOFOL;  Surgeon: Clarene Essex, MD;  Location: [facility] ENDOSCOPY;  Service: Endoscopy;  Laterality: N/A;  . ESOPHAGOGASTRODUODENOSCOPY (EGD) WITH PROPOFOL N/A 02/10/2016   Procedure: ESOPHAGOGASTRODUODENOSCOPY (EGD) WITH PROPOFOL;  Surgeon: Clarene Essex, MD;  Location: [facility] ENDOSCOPY;  Service: Endoscopy;  Laterality: N/A;  . WISDOM TOOTH EXTRACTION     upper right wisdom tooth        Home Medications    Prior to Admission medications   Medication Sig Start Date End Date Taking? Authorizing Provider  citalopram (CELEXA) 40 MG tablet Take 0.5 tablets (20 mg total)  by mouth daily. 10/16/17 11/15/17  Alma Friendly, MD  D3 SUPER STRENGTH 2000 units CAPS Take 2,000 Units by mouth daily. 12/09/15   [provider]  losartan (COZAAR) 100 MG tablet Take 1 tablet (100 mg total) by mouth daily. 10/16/17   Alma Friendly, MD  pantoprazole (PROTONIX) 40 MG tablet Take 1 tablet (40 mg total) by mouth daily. 10/17/17 11/16/17  Alma Friendly, MD  Tetrahydrozoline HCl (EYE DROPS OP) Apply 1 drop to eye daily as needed (red eyes).    [provider]  thiamine 100 MG tablet Take 1 tablet (100 mg total) by mouth daily. 10/17/17   Alma Friendly, MD    Family History No family history on file.  Social History Social History   Tobacco Use  . Smoking status: Former Smoker    Types: Cigars    Last attempt to quit: 12/08/2015    Years since quitting: 2.0  . Smokeless tobacco: Never Used  . Tobacco comment: stopped smpoking cigarettes 7 years ago  Substance Use Topics  . Alcohol use: Yes    Alcohol/week: 0.0 standard drinks  . Drug use: No    Comment: used in age 37's     Allergies   Codeine   Review of Systems Review of Systems  All other systems reviewed and are negative.    Physical Exam Updated Vital Signs BP (!) 179/93   Pulse (!) 110  Temp 98.6 F (37 C) (Oral)   Resp 20   Ht 6' (1.829 m)   Wt 87.1 kg   SpO2 94%   BMI 26.04 kg/m   Physical Exam  Nursing note and vitals reviewed.  62 year old male, resting comfortably and in no acute distress. Vital signs are significant for elevated blood pressure. Oxygen saturation is 94%, which is normal. Head is normocephalic and atraumatic. PERRLA, EOMI. Oropharynx is clear. Neck is nontender and supple without adenopathy or JVD. Back is nontender and there is no CVA tenderness. Lungs are clear without rales, wheezes, or rhonchi. Chest is nontender. Heart has regular rate and rhythm without murmur. Abdomen is soft, flat, nontender without masses or  hepatosplenomegaly and peristalsis is normoactive. Extremities: Marked swelling of the right shoulder and pattern suggestive of joint effusion.  Marked pain with any movement of the left shoulder.  Distal neurovascular exam is intact with strong pulses, prompt capillary refill, normal sensation, normal motor function.  2+ pretibial edema bilaterally. Skin is warm and dry without rash. Neurologic: Mental status is normal, cranial nerves are intact, there are no motor or sensory deficits.  ED Treatments / Results   Radiology Dg Shoulder Left  Result Date: 12/09/2017 CLINICAL DATA:  Left shoulder pain/injury EXAM: LEFT SHOULDER - 2+ VIEW COMPARISON:  None. FINDINGS: No acute fracture is seen. Chronic widening at the acromioclavicular joint. Mild degenerative changes with subacromial spurring. Suspected inferior subluxation of the humeral head, possibly reflecting pseudosubluxation related to a shoulder joint effusion/hemarthrosis. Visualized soft tissues are within normal limits. Visualized left lung is clear. IMPRESSION: Suspected inferior subluxation of the humeral head, possibly reflecting pseudosubluxation related to a shoulder joint effusion/hemarthrosis. Chronic widening at the acromioclavicular joint. Mild degenerative changes with subacromial spurring. No acute fracture is seen. Electronically Signed   By: Julian Hy M.D.   On: 12/09/2017 22:29    Procedures Procedures   Medications Ordered in ED Medications  acetaminophen (TYLENOL) tablet 650 mg (has no administration in time range)  ibuprofen (ADVIL,MOTRIN) tablet 800 mg (has no administration in time range)     Initial Impression / Assessment and Plan / ED Course  I have reviewed the triage vital signs and the nursing notes.  Pertinent imaging results that were available during my care of the patient were reviewed by me and considered in my medical decision making (see chart for details).  Left shoulder pain following a  rather trivial injury.  X-rays show no fracture, but pseudo-dislocation.  This is consistent with clinical exam which is strongly suggestive of joint effusion, possible hemarthrosis.  He is placed in a sling for comfort and advised to use combination acetaminophen plus ibuprofen for pain control.  Referred to sports medicine for follow-up.  Old records are reviewed, and he has had problems with renal insufficiency in the past, but most recent creatinine was normal.  Final Clinical Impressions(s) / ED Diagnoses   Final diagnoses:  Injury of left shoulder, initial encounter  Effusion, left shoulder    ED Discharge Orders         Ordered    ibuprofen (ADVIL,MOTRIN) 600 MG tablet  4 times daily     12/10/17 1950           Delora Fuel, MD 93/26/71 0100

## 2017-12-13 ENCOUNTER — Emergency Department (HOSPITAL_BASED_OUTPATIENT_CLINIC_OR_DEPARTMENT_OTHER): Payer: Medicaid Other

## 2017-12-13 ENCOUNTER — Emergency Department (HOSPITAL_BASED_OUTPATIENT_CLINIC_OR_DEPARTMENT_OTHER)
Admission: EM | Admit: 2017-12-13 | Discharge: 2017-12-13 | Disposition: A | Discharge status: Home / Self Care | Payer: Medicaid Other | Attending: Emergency Medicine | Admitting: Emergency Medicine

## 2017-12-13 ENCOUNTER — Other Ambulatory Visit: Payer: Self-pay

## 2017-12-13 ENCOUNTER — Encounter (HOSPITAL_BASED_OUTPATIENT_CLINIC_OR_DEPARTMENT_OTHER): Payer: Self-pay | Admitting: Emergency Medicine

## 2017-12-13 DIAGNOSIS — M25012 Hemarthrosis, left shoulder: Secondary | ICD-10-CM | POA: Diagnosis not present

## 2017-12-13 DIAGNOSIS — M2500 Hemarthrosis, unspecified joint: Secondary | ICD-10-CM

## 2017-12-13 DIAGNOSIS — Z87891 Personal history of nicotine dependence: Secondary | ICD-10-CM | POA: Insufficient documentation

## 2017-12-13 DIAGNOSIS — I10 Essential (primary) hypertension: Secondary | ICD-10-CM | POA: Diagnosis not present

## 2017-12-13 DIAGNOSIS — M25512 Pain in left shoulder: Secondary | ICD-10-CM

## 2017-12-13 DIAGNOSIS — Z79899 Other long term (current) drug therapy: Secondary | ICD-10-CM | POA: Diagnosis not present

## 2017-12-13 LAB — SYNOVIAL CELL COUNT + DIFF, W/ CRYSTALS
CRYSTALS FLUID: NONE SEEN
Eosinophils-Synovial: 3 % — ABNORMAL HIGH (ref 0–1)
Lymphocytes-Synovial Fld: 38 % — ABNORMAL HIGH (ref 0–20)
Monocyte-Macrophage-Synovial Fluid: 9 % — ABNORMAL LOW (ref 50–90)
Neutrophil, Synovial: 50 % — ABNORMAL HIGH (ref 0–25)
WBC, Synovial: 2444 /mm3 — ABNORMAL HIGH (ref 0–200)

## 2017-12-13 MED ORDER — ACETAMINOPHEN 325 MG PO TABS
650.0000 mg | ORAL_TABLET | Freq: Once | ORAL | Status: AC
  Administered 2017-12-13: 650 mg via ORAL
  Filled 2017-12-13: qty 2

## 2017-12-13 MED ORDER — ACETAMINOPHEN 325 MG PO TABS: 650.0000 mg | ORAL_TABLET | Freq: Four times a day (QID) | ORAL | 0 refills | Status: AC | PRN

## 2017-12-13 MED ORDER — KETOROLAC TROMETHAMINE 30 MG/ML IJ SOLN
30.0000 mg | Freq: Once | INTRAMUSCULAR | Status: AC
  Administered 2017-12-13: 30 mg via INTRAMUSCULAR
  Filled 2017-12-13: qty 1

## 2017-12-13 MED ORDER — LIDOCAINE HCL 2 % IJ SOLN
20.0000 mL | Freq: Once | INTRAMUSCULAR | Status: AC
  Administered 2017-12-13: 400 mg
  Filled 2017-12-13: qty 20

## 2017-12-13 NOTE — Discharge Instructions (Addendum)
You were seen today for left shoulder pain.  You had a significant amount of fluid and blood around your joint.  Fluid analysis has been sent.  Keep your joint immobilized.  Ice the joint.  You should not do any heavy lifting and try to avoid reinjuring the joint.  Follow-up with sports medicine.  Avoid ibuprofen for the next 24 to 48 hours.

## 2017-12-13 NOTE — ED Triage Notes (Addendum)
C/o shoulder pain from previous injury a few days ago. States he has been taking Tylenol and Motrin. Last dose 2000 last night. Pt is from [facility]. Pt not wearing sling he was given at last visit, states it is more comfortable to "let it dangle". No redness or warmth to shoulder.

## 2017-12-13 NOTE — ED Provider Notes (Signed)
[place] EMERGENCY DEPARTMENT Provider Note   CSN: [id number] Arrival date & time: 12/13/17  0320     History   Chief Complaint Chief Complaint  Patient presents with  . Shoulder Pain    HPI Mark Ayala is a 62 y.o. male.  HPI  This is a 62 year old male with a history of hypertension, hepatitis C, alcohol abuse and rehab who presents with persistent and worsening left shoulder pain.  Patient was seen and evaluated on October 4.  At that time he reported prior shoulder injury and increased pain after lifting a chair.  His x-ray showed likely subluxation but no dislocation.  He was noted to have an effusion at that time.  Patient was discharged with a sling and pain control.  Patient reports that it is uncomfortable for his arm to be in a sling.  He prefers to have it "hanging down."  He reports increasing pain tonight which is worse from prior.  He states that his rehab facility would not give him any more Tylenol or Motrin.  He rates his pain at 10 out of 10.  Denies any numbness or tingling in the arm.  He does state that Motrin and Tylenol do seem to help.  He denies any fevers.  No new injury.  No known history of gout.  Past Medical History:  Diagnosis Date  . Arthritis   . Depression   . GERD (gastroesophageal reflux disease)   . Hepatitis    Hepititis C  . Hypertension     Patient Active Problem List   Diagnosis Date Noted  . ARF (acute renal failure) ([place]) 10/14/2017  . Elevated liver enzymes 10/14/2017  . GERD (gastroesophageal reflux disease) 10/14/2017  . Essential hypertension 10/14/2017  . Chronic hepatitis C without hepatic coma ([place]) 01/30/2014    Past Surgical History:  Procedure Laterality Date  . COLONOSCOPY WITH PROPOFOL N/A 02/10/2016   Procedure: COLONOSCOPY WITH PROPOFOL;  Surgeon: Clarene Essex, MD;  Location: [facility];  Service: Endoscopy;  Laterality: N/A;  . ESOPHAGOGASTRODUODENOSCOPY (EGD) WITH PROPOFOL N/A 02/10/2016   Procedure:  ESOPHAGOGASTRODUODENOSCOPY (EGD) WITH PROPOFOL;  Surgeon: Clarene Essex, MD;  Location: [facility];  Service: Endoscopy;  Laterality: N/A;  . WISDOM TOOTH EXTRACTION     upper right wisdom tooth        Home Medications    Prior to Admission medications   Medication Sig Start Date End Date Taking? Authorizing Provider  acetaminophen (TYLENOL) 325 MG tablet Take 2 tablets (650 mg total) by mouth every 6 (six) hours as needed. 12/13/17   Iysha Mishkin, Barbette Hair, MD  citalopram (CELEXA) 40 MG tablet Take 0.5 tablets (20 mg total) by mouth daily. 10/16/17 11/15/17  Alma Friendly, MD  D3 SUPER STRENGTH 2000 units CAPS Take 2,000 Units by mouth daily. 12/09/15   [provider]  ibuprofen (ADVIL,MOTRIN) 600 MG tablet Take 1 tablet (600 mg total) by mouth 4 (four) times daily. 37/9/02   Delora Fuel, MD  losartan (COZAAR) 100 MG tablet Take 1 tablet (100 mg total) by mouth daily. 10/16/17   Alma Friendly, MD  pantoprazole (PROTONIX) 40 MG tablet Take 1 tablet (40 mg total) by mouth daily. 10/17/17 11/16/17  Alma Friendly, MD  Tetrahydrozoline HCl (EYE DROPS OP) Apply 1 drop to eye daily as needed (red eyes).    [provider]  thiamine 100 MG tablet Take 1 tablet (100 mg total) by mouth daily. 10/17/17   Alma Friendly, MD  Family History No family history on file.  Social History Social History   Tobacco Use  . Smoking status: Former Smoker    Types: Cigars    Last attempt to quit: 12/08/2015    Years since quitting: 2.0  . Smokeless tobacco: Never Used  . Tobacco comment: stopped smpoking cigarettes 7 years ago  Substance Use Topics  . Alcohol use: Yes    Alcohol/week: 0.0 standard drinks  . Drug use: No    Comment: used in age 65's     Allergies   Codeine   Review of Systems Review of Systems  Constitutional: Negative for fever.  Cardiovascular: Negative for chest pain.  Musculoskeletal: Positive for joint swelling.       Left shoulder  pain  Neurological: Negative for weakness and numbness.  All other systems reviewed and are negative.    Physical Exam Updated Vital Signs BP (!) 172/87 (BP Location: Right Arm)   Pulse (!) 118   Temp 97.9 F (36.6 C) (Oral)   Resp (!) 26   Ht 1.829 m (6')   Wt 87.1 kg   SpO2 98%   BMI 26.04 kg/m   Physical Exam  Constitutional: He is oriented to person, place, and time. He appears well-developed and well-nourished.  Moaning but nontoxic-appearing  HENT:  Head: Normocephalic and atraumatic.  Eyes: Pupils are equal, round, and reactive to light.  Cardiovascular: Normal rate, regular rhythm and normal heart sounds.  No murmur heard. Pulmonary/Chest: Effort normal and breath sounds normal. No respiratory distress. He has no wheezes.  Abdominal: Soft. There is no tenderness.  Musculoskeletal: He exhibits no edema.  Patient with left arm hanging from the side of the bed, very limited range of motion, tenderness to palpation about the shoulder joint with tense effusion noted, no warmth or significant erythema or overlying skin changes, 2+ radial pulse, neurovascular intact distally  Neurological: He is alert and oriented to person, place, and time.  Skin: Skin is warm and dry.  Psychiatric: He has a normal mood and affect.  Nursing note and vitals reviewed.    ED Treatments / Results  Labs (all labs ordered are listed, but only abnormal results are displayed) Labs Reviewed  BODY FLUID CULTURE  GRAM STAIN  GLUCOSE, BODY FLUID OTHER  PROTEIN, BODY FLUID (OTHER)  SYNOVIAL CELL COUNT + DIFF, W/ CRYSTALS    EKG None  Radiology Dg Shoulder Left  Result Date: 12/13/2017 CLINICAL DATA:  Left shoulder pain. EXAM: LEFT SHOULDER - 2+ VIEW COMPARISON:  Radiograph 12/09/2017 FINDINGS: No fracture. Previously seen inferior subluxation of the humeral head persists but is less prominent than on prior exam. Mild inferior glenoid spurring. No bony destructive change. Chronic  widening of the acromioclavicular joint. There is soft tissue prominence about the shoulder laterally. IMPRESSION: 1. Inferior subluxation of the humeral head, persistent but less prominent than on prior exam. Soft tissue prominence laterally, findings suggest joint effusion. 2. No fracture. Chronic acromioclavicular widening. Mild inferior glenoid spurring. Electronically Signed   By: Keith Rake M.D.   On: 12/13/2017 04:12    Procedures .Joint Aspiration/Arthrocentesis Date/Time: 12/13/2017 5:19 AM Performed by: Merryl Hacker, MD Authorized by: Merryl Hacker, MD   Consent:    Consent obtained:  Written   Consent given by:  Patient   Risks discussed:  Bleeding, infection and pain   Alternatives discussed:  Observation and alternative treatment Location:    Location:  Shoulder   Shoulder:  L glenohumeral Anesthesia (see MAR for exact  dosages):    Anesthesia method:  Local infiltration   Local anesthetic:  Lidocaine 2% w/o epi Procedure details:    Preparation: Patient was prepped and draped in usual sterile fashion     Needle gauge:  18 G   Ultrasound guidance: no     Approach:  Anterior   Aspirate amount:  80 cc   Aspirate characteristics:  Bloody   Steroid injected: no     Specimen collected: yes   Post-procedure details:    Dressing:  Adhesive bandage   Patient tolerance of procedure:  Tolerated well, no immediate complications   (including critical care time)  Medications Ordered in ED Medications  lidocaine (XYLOCAINE) 2 % (with pres) injection 400 mg (has no administration in time range)  ketorolac (TORADOL) 30 MG/ML injection 30 mg (30 mg Intramuscular Given 12/13/17 0420)  acetaminophen (TYLENOL) tablet 650 mg (650 mg Oral Given 12/13/17 0420)     Initial Impression / Assessment and Plan / ED Course  I have reviewed the triage vital signs and the nursing notes.  Pertinent labs & imaging results that were available during my care of the patient were  reviewed by me and considered in my medical decision making (see chart for details).     Patient presents with acute worsening of left shoulder pain.  Denies any new injury.  He is moaning.  Vital signs notable for tachycardia in the 110.  He has very limited range of motion.  He does not have any signs or symptoms to suggest acute septic joint.  However, he does have a very large effusion.  Could be hemarthrosis versus inflammatory.  Patient without significant improvement of pain with re-dosing of Tylenol Motrin.  X-ray shows partially improved subluxation of the shoulder.  No dislocation.  On recheck, he continues to endorse 10 out of 10 pain.  I discussed with the patient that he may benefit from joint aspiration given obvious large joint effusion on exam.  We discussed risks and benefits including infection and bleeding.  Patient stated understanding and would like to proceed with procedure.  See procedure note above.  80 cc of bloody fluid was aspirated from the joint with immediate relief of pain.  Patient denies any history of blood thinners or blood clotting disorders.  Suspect hemarthrosis.  Lower suspicion for infection.  For this reason, will not await arthrocentesis results.  Bandage was placed.  Patient was placed in a sling.  I encouraged him to keep the shoulder immobilized and apply ice.  Continue Tylenol for pain control.  Avoid ibuprofen for the next 24 to 48 hours.  To follow-up with sports medicine.  I will follow-up on arthrocentesis results.  11:01 PM Synovial fluid analysis reviewed.  No bacteria seen.  Approximately 2000 white cells present.  Not consistent with infectious etiology.  Final Clinical Impressions(s) / ED Diagnoses   Final diagnoses:  Acute pain of left shoulder  Hemarthrosis    ED Discharge Orders         Ordered    acetaminophen (TYLENOL) 325 MG tablet  Every 6 hours PRN     12/13/17 0508           Merryl Hacker, MD 12/13/17 2302

## 2017-12-14 LAB — GLUCOSE, BODY FLUID OTHER: Glucose, Body Fluid Other: 9 mg/dL

## 2017-12-15 LAB — PROTEIN, BODY FLUID (OTHER): Total Protein, Body Fluid Other: 6.4 g/dL

## 2017-12-16 LAB — BODY FLUID CULTURE: CULTURE: NO GROWTH

## 2017-12-28 ENCOUNTER — Emergency Department (HOSPITAL_COMMUNITY)
Admission: EM | Admit: 2017-12-28 | Discharge: 2017-12-29 | Disposition: A | Discharge status: Home / Self Care | Payer: Medicaid Other | Attending: Emergency Medicine | Admitting: Emergency Medicine

## 2017-12-28 ENCOUNTER — Encounter (HOSPITAL_COMMUNITY): Payer: Self-pay

## 2017-12-28 DIAGNOSIS — Y906 Blood alcohol level of 120-199 mg/100 ml: Secondary | ICD-10-CM | POA: Insufficient documentation

## 2017-12-28 DIAGNOSIS — Z87891 Personal history of nicotine dependence: Secondary | ICD-10-CM | POA: Insufficient documentation

## 2017-12-28 DIAGNOSIS — I10 Essential (primary) hypertension: Secondary | ICD-10-CM | POA: Insufficient documentation

## 2017-12-28 DIAGNOSIS — F10929 Alcohol use, unspecified with intoxication, unspecified: Secondary | ICD-10-CM | POA: Diagnosis present

## 2017-12-28 DIAGNOSIS — Z79899 Other long term (current) drug therapy: Secondary | ICD-10-CM | POA: Insufficient documentation

## 2017-12-28 DIAGNOSIS — F10920 Alcohol use, unspecified with intoxication, uncomplicated: Secondary | ICD-10-CM | POA: Diagnosis not present

## 2017-12-28 NOTE — ED Notes (Signed)
EDP Pollina at bedside.  

## 2017-12-28 NOTE — ED Provider Notes (Signed)
[place] DEPT Provider Note   CSN: [id number] Arrival date & time: 12/28/17  2213     History   Chief Complaint Chief Complaint  Patient presents with  . intoxicated    HPI Mark Ayala is a 62 y.o. male.  Patient brought to the emergency department by ambulance after he was found lying on the sidewalk asleep.  Patient reports that his feet were hurting from walking so he laid down.  He says he did not sleep well last night so as soon as he laid down he fell asleep.     Past Medical History:  Diagnosis Date  . Arthritis   . Depression   . GERD (gastroesophageal reflux disease)   . Hepatitis    Hepititis C  . Hypertension     Patient Active Problem List   Diagnosis Date Noted  . ARF (acute renal failure) ([place]) 10/14/2017  . Elevated liver enzymes 10/14/2017  . GERD (gastroesophageal reflux disease) 10/14/2017  . Essential hypertension 10/14/2017  . Chronic hepatitis C without hepatic coma ([place]) 01/30/2014    Past Surgical History:  Procedure Laterality Date  . COLONOSCOPY WITH PROPOFOL N/A 02/10/2016   Procedure: COLONOSCOPY WITH PROPOFOL;  Surgeon: Clarene Essex, MD;  [facility];  Service: Endoscopy;  Laterality: N/A;  . ESOPHAGOGASTRODUODENOSCOPY (EGD) WITH PROPOFOL N/A 02/10/2016   Procedure: ESOPHAGOGASTRODUODENOSCOPY (EGD) WITH PROPOFOL;  Surgeon: Clarene Essex, MD;  [facility];  Service: Endoscopy;  Laterality: N/A;  . WISDOM TOOTH EXTRACTION     upper right wisdom tooth        Home Medications    Prior to Admission medications   Medication Sig Start Date End Date Taking? Authorizing Provider  acetaminophen (TYLENOL) 325 MG tablet Take 2 tablets (650 mg total) by mouth every 6 (six) hours as needed. 12/13/17   Horton, Barbette Hair, MD  citalopram (CELEXA) 40 MG tablet Take 0.5 tablets (20 mg total) by mouth daily. 10/16/17 11/15/17  Alma Friendly, MD  D3 SUPER STRENGTH 2000 units CAPS Take 2,000 Units  by mouth daily. 12/09/15   [provider]  ibuprofen (ADVIL,MOTRIN) 600 MG tablet Take 1 tablet (600 mg total) by mouth 4 (four) times daily. 52/8/41   Delora Fuel, MD  losartan (COZAAR) 100 MG tablet Take 1 tablet (100 mg total) by mouth daily. 10/16/17   Alma Friendly, MD  pantoprazole (PROTONIX) 40 MG tablet Take 1 tablet (40 mg total) by mouth daily. 10/17/17 11/16/17  Alma Friendly, MD  Tetrahydrozoline HCl (EYE DROPS OP) Apply 1 drop to eye daily as needed (red eyes).    [provider]  thiamine 100 MG tablet Take 1 tablet (100 mg total) by mouth daily. 10/17/17   Alma Friendly, MD    Family History History reviewed. No pertinent family history.  Social History Social History   Tobacco Use  . Smoking status: Former Smoker    Types: Cigars    Last attempt to quit: 12/08/2015    Years since quitting: 2.0  . Smokeless tobacco: Never Used  . Tobacco comment: stopped smpoking cigarettes 7 years ago  Substance Use Topics  . Alcohol use: Yes    Alcohol/week: 0.0 standard drinks  . Drug use: No    Comment: used in age 64's     Allergies   Codeine   Review of Systems Review of Systems  Constitutional: Positive for fatigue.  Respiratory: Negative for shortness of breath.   Cardiovascular: Negative for chest pain.  All  other systems reviewed and are negative.    Physical Exam Updated Vital Signs BP 126/71   Pulse 99   Temp 97.8 F (36.6 C) (Oral)   Resp 18   SpO2 97%   Physical Exam  Constitutional: He is oriented to person, place, and time. He appears well-developed and well-nourished. No distress.  HENT:  Head: Normocephalic and atraumatic.  Right Ear: Hearing normal.  Left Ear: Hearing normal.  Nose: Nose normal.  Mouth/Throat: Oropharynx is clear and moist and mucous membranes are normal.  Eyes: Pupils are equal, round, and reactive to light. Conjunctivae and EOM are normal.  Neck: Normal range of motion. Neck supple.    Cardiovascular: Regular rhythm, S1 normal and S2 normal. Exam reveals no gallop and no friction rub.  No murmur heard. Pulmonary/Chest: Effort normal and breath sounds normal. No respiratory distress. He exhibits no tenderness.  Abdominal: Soft. Normal appearance and bowel sounds are normal. There is no hepatosplenomegaly. There is no tenderness. There is no rebound, no guarding, no tenderness at McBurney's point and negative Murphy's sign. No hernia.  Musculoskeletal: Normal range of motion.  Neurological: He is alert and oriented to person, place, and time. He has normal strength. No cranial nerve deficit or sensory deficit. Coordination normal. GCS eye subscore is 4. GCS verbal subscore is 5. GCS motor subscore is 6.  Skin: Skin is warm, dry and intact. No rash noted. No cyanosis.  Psychiatric: He has a normal mood and affect. His speech is normal and behavior is normal. Thought content normal.  Nursing note and vitals reviewed.    ED Treatments / Results  Labs (all labs ordered are listed, but only abnormal results are displayed) Labs Reviewed  CBC WITH DIFFERENTIAL/PLATELET - Abnormal; Notable for the following components:      Result Value   RBC 3.29 (*)    Hemoglobin 10.9 (*)    HCT 33.4 (*)    MCV 101.5 (*)    All other components within normal limits  COMPREHENSIVE METABOLIC PANEL - Abnormal; Notable for the following components:   Sodium 132 (*)    CO2 20 (*)    Glucose, Bld 102 (*)    Calcium 8.6 (*)    Albumin 2.7 (*)    AST 87 (*)    All other components within normal limits  ETHANOL - Abnormal; Notable for the following components:   Alcohol, Ethyl (B) 184 (*)    All other components within normal limits  URINALYSIS, ROUTINE W REFLEX MICROSCOPIC - Abnormal; Notable for the following components:   Hgb urine dipstick MODERATE (*)    Protein, ur 30 (*)    All other components within normal limits  RAPID URINE DRUG SCREEN, HOSP PERFORMED    EKG EKG  Interpretation  Date/Time:  Wednesday December 29 2017 00:18:49 EDT Ventricular Rate:  107 PR Interval:    QRS Duration: 91 QT Interval:  376 QTC Calculation: 502 R Axis:   81 Text Interpretation:  Sinus tachycardia Borderline right axis deviation Prolonged QT interval No previous tracing Confirmed by Orpah Greek [phone]) on 12/29/2017 12:54:22 AM   Radiology No results found.  Procedures Procedures (including critical care time)  Medications Ordered in ED Medications - No data to display   Initial Impression / Assessment and Plan / ED Course  I have reviewed the triage vital signs and the nursing notes.  Pertinent labs & imaging results that were available during my care of the patient were reviewed by me and considered in  my medical decision making (see chart for details).     Patient presents to the emergency department intoxicated.  He was found sleeping on the sidewalk.  At arrival he was intoxicated but arousable.  He has been monitored through the night here in the ER and is now awake and alert, telling jokes.  He is appropriate for discharge.  Final Clinical Impressions(s) / ED Diagnoses   Final diagnoses:  Alcoholic intoxication without complication [facility])    ED Discharge Orders    None       Orpah Greek, MD 12/29/17 [phone]

## 2017-12-28 NOTE — ED Triage Notes (Signed)
Pt was found sleeping outside the laundry mat at [employer] and an employee called EMS, pt is intoxicated and sleeping

## 2017-12-28 NOTE — ED Notes (Signed)
Bed: [facility] Expected date:  Expected time:  Means of arrival:  Comments: 

## 2017-12-29 LAB — URINALYSIS, ROUTINE W REFLEX MICROSCOPIC
Bacteria, UA: NONE SEEN
Bilirubin Urine: NEGATIVE
Glucose, UA: NEGATIVE mg/dL
Ketones, ur: NEGATIVE mg/dL
Leukocytes, UA: NEGATIVE
Nitrite: NEGATIVE
PH: 5 (ref 5.0–8.0)
Protein, ur: 30 mg/dL — AB
SPECIFIC GRAVITY, URINE: 1.01 (ref 1.005–1.030)

## 2017-12-29 LAB — CBC WITH DIFFERENTIAL/PLATELET
ABS IMMATURE GRANULOCYTES: 0.02 10*3/uL (ref 0.00–0.07)
Basophils Absolute: 0.1 10*3/uL (ref 0.0–0.1)
Basophils Relative: 1 %
Eosinophils Absolute: 0.2 10*3/uL (ref 0.0–0.5)
Eosinophils Relative: 2 %
HCT: 33.4 % — ABNORMAL LOW (ref 39.0–52.0)
HEMOGLOBIN: 10.9 g/dL — AB (ref 13.0–17.0)
IMMATURE GRANULOCYTES: 0 %
LYMPHS PCT: 29 %
Lymphs Abs: 1.8 10*3/uL (ref 0.7–4.0)
MCH: 33.1 pg (ref 26.0–34.0)
MCHC: 32.6 g/dL (ref 30.0–36.0)
MCV: 101.5 fL — AB (ref 80.0–100.0)
MONO ABS: 0.8 10*3/uL (ref 0.1–1.0)
MONOS PCT: 14 %
NEUTROS ABS: 3.3 10*3/uL (ref 1.7–7.7)
NEUTROS PCT: 54 %
Platelets: 157 10*3/uL (ref 150–400)
RBC: 3.29 MIL/uL — AB (ref 4.22–5.81)
RDW: 13.2 % (ref 11.5–15.5)
WBC: 6.2 10*3/uL (ref 4.0–10.5)
nRBC: 0 % (ref 0.0–0.2)

## 2017-12-29 LAB — COMPREHENSIVE METABOLIC PANEL
ALK PHOS: 84 U/L (ref 38–126)
ALT: 42 U/L (ref 0–44)
AST: 87 U/L — AB (ref 15–41)
Albumin: 2.7 g/dL — ABNORMAL LOW (ref 3.5–5.0)
Anion gap: 11 (ref 5–15)
BUN: 8 mg/dL (ref 8–23)
CO2: 20 mmol/L — AB (ref 22–32)
Calcium: 8.6 mg/dL — ABNORMAL LOW (ref 8.9–10.3)
Chloride: 101 mmol/L (ref 98–111)
Creatinine, Ser: 0.83 mg/dL (ref 0.61–1.24)
GFR calc non Af Amer: >60 mL/min (ref >60)
Glucose, Bld: 102 mg/dL — ABNORMAL HIGH (ref 70–99)
Potassium: 3.7 mmol/L (ref 3.5–5.1)
SODIUM: 132 mmol/L — AB (ref 135–145)
TOTAL PROTEIN: 6.7 g/dL (ref 6.5–8.1)
Total Bilirubin: 1.1 mg/dL (ref 0.3–1.2)

## 2017-12-29 LAB — RAPID URINE DRUG SCREEN, HOSP PERFORMED
Amphetamines: NOT DETECTED
BENZODIAZEPINES: NOT DETECTED
Barbiturates: NOT DETECTED
COCAINE: NOT DETECTED
Opiates: NOT DETECTED
Tetrahydrocannabinol: NOT DETECTED

## 2017-12-29 LAB — ETHANOL: ALCOHOL ETHYL (B): 184 mg/dL — AB (ref <10)

## 2018-03-27 ENCOUNTER — Emergency Department (HOSPITAL_COMMUNITY)
Admission: EM | Admit: 2018-03-27 | Discharge: 2018-03-28 | Disposition: A | Discharge status: Home / Self Care | Payer: Medicaid Other | Attending: Emergency Medicine | Admitting: Emergency Medicine

## 2018-03-27 ENCOUNTER — Encounter (HOSPITAL_COMMUNITY): Payer: Self-pay | Admitting: Emergency Medicine

## 2018-03-27 DIAGNOSIS — I10 Essential (primary) hypertension: Secondary | ICD-10-CM | POA: Diagnosis not present

## 2018-03-27 DIAGNOSIS — Z79899 Other long term (current) drug therapy: Secondary | ICD-10-CM | POA: Diagnosis not present

## 2018-03-27 DIAGNOSIS — Y999 Unspecified external cause status: Secondary | ICD-10-CM | POA: Insufficient documentation

## 2018-03-27 DIAGNOSIS — Z87891 Personal history of nicotine dependence: Secondary | ICD-10-CM | POA: Diagnosis not present

## 2018-03-27 DIAGNOSIS — Y9355 Activity, bike riding: Secondary | ICD-10-CM | POA: Diagnosis not present

## 2018-03-27 DIAGNOSIS — Y929 Unspecified place or not applicable: Secondary | ICD-10-CM | POA: Insufficient documentation

## 2018-03-27 DIAGNOSIS — S5002XA Contusion of left elbow, initial encounter: Secondary | ICD-10-CM | POA: Diagnosis not present

## 2018-03-27 DIAGNOSIS — S59902A Unspecified injury of left elbow, initial encounter: Secondary | ICD-10-CM | POA: Diagnosis present

## 2018-03-27 DIAGNOSIS — W010XXA Fall on same level from slipping, tripping and stumbling without subsequent striking against object, initial encounter: Secondary | ICD-10-CM
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
## 2018-03-28 ENCOUNTER — Emergency Department (HOSPITAL_COMMUNITY): Payer: Medicaid Other

## 2018-03-28 MED ORDER — ACETAMINOPHEN 325 MG PO TABS
650.0000 mg | ORAL_TABLET | Freq: Once | ORAL | Status: AC
  Administered 2018-03-28: 650 mg via ORAL
  Filled 2018-03-28: qty 2

## 2018-03-28 MED ORDER — POTASSIUM CHLORIDE CRYS ER 20 MEQ PO TBCR: 20.0000 meq | EXTENDED_RELEASE_TABLET | Freq: Every day | ORAL | 0 refills | Status: DC

## 2018-03-28 MED ORDER — IBUPROFEN 200 MG PO TABS
400.0000 mg | ORAL_TABLET | Freq: Once | ORAL | Status: AC
  Administered 2018-03-28: 400 mg via ORAL
  Filled 2018-03-28: qty 2

## 2018-03-28 MED ORDER — LOSARTAN POTASSIUM 100 MG PO TABS: 100.0000 mg | ORAL_TABLET | Freq: Every day | ORAL | 0 refills | Status: AC

## 2018-03-28 MED ORDER — HYDROCHLOROTHIAZIDE 25 MG PO TABS: 25.0000 mg | ORAL_TABLET | Freq: Every day | ORAL | 0 refills | Status: DC

## 2018-03-28 MED ORDER — CITALOPRAM HYDROBROMIDE 40 MG PO TABS: 20.0000 mg | ORAL_TABLET | Freq: Every day | ORAL | 0 refills | Status: AC

## 2018-03-28 NOTE — ED Notes (Signed)
Sling applied, patient educated about sling use.

## 2018-03-28 NOTE — Discharge Instructions (Addendum)
Apply ice for thirty minutes at a time, four times a day.  Take acetaminophen or ibuprofen as needed for pain.  Use the sling as needed.

## 2018-04-06 ENCOUNTER — Emergency Department (HOSPITAL_COMMUNITY)
Admission: EM | Admit: 2018-04-06 | Discharge: 2018-04-06 | Disposition: A | Discharge status: Home / Self Care | Payer: Medicaid Other | Attending: Emergency Medicine | Admitting: Emergency Medicine

## 2018-04-06 ENCOUNTER — Encounter (HOSPITAL_COMMUNITY): Payer: Self-pay | Admitting: Family Medicine

## 2018-04-06 DIAGNOSIS — M7032 Other bursitis of elbow, left elbow: Secondary | ICD-10-CM | POA: Insufficient documentation

## 2018-04-06 DIAGNOSIS — Z79899 Other long term (current) drug therapy: Secondary | ICD-10-CM | POA: Diagnosis not present

## 2018-04-06 DIAGNOSIS — I10 Essential (primary) hypertension: Secondary | ICD-10-CM | POA: Insufficient documentation

## 2018-04-06 DIAGNOSIS — Y939 Activity, unspecified: Secondary | ICD-10-CM | POA: Diagnosis not present

## 2018-04-06 DIAGNOSIS — R2232 Localized swelling, mass and lump, left upper limb: Secondary | ICD-10-CM | POA: Diagnosis present

## 2018-04-06 DIAGNOSIS — F1010 Alcohol abuse, uncomplicated: Secondary | ICD-10-CM

## 2018-04-06 DIAGNOSIS — F17290 Nicotine dependence, other tobacco product, uncomplicated: Secondary | ICD-10-CM | POA: Insufficient documentation

## 2018-04-06 DIAGNOSIS — M7150 Other bursitis, not elsewhere classified, unspecified site: Secondary | ICD-10-CM

## 2018-04-06 MED ORDER — IBUPROFEN 800 MG PO TABS
800.0000 mg | ORAL_TABLET | Freq: Once | ORAL | Status: AC
  Administered 2018-04-06: 800 mg via ORAL
  Filled 2018-04-06: qty 1

## 2018-04-06 NOTE — ED Notes (Signed)
Pt is intoxicated ETOH

## 2018-04-06 NOTE — Discharge Instructions (Signed)
You may alternate Tylenol 1000 mg every 6 hours as needed for pain and Ibuprofen 800 mg every 8 hours as needed for pain.  Please take Ibuprofen with food. ° °
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
## 2018-04-06 NOTE — ED Notes (Signed)
Pt verbalized discharge instructions and follow up care. Alert and ambulatory  
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
## 2018-04-19 DIAGNOSIS — G8929 Other chronic pain: Secondary | ICD-10-CM | POA: Diagnosis present

## 2018-04-19 DIAGNOSIS — M869 Osteomyelitis, unspecified: Principal | ICD-10-CM | POA: Diagnosis present

## 2018-04-19 DIAGNOSIS — F17290 Nicotine dependence, other tobacco product, uncomplicated: Secondary | ICD-10-CM | POA: Diagnosis present

## 2018-04-19 DIAGNOSIS — F319 Bipolar disorder, unspecified: Secondary | ICD-10-CM | POA: Diagnosis present

## 2018-04-19 DIAGNOSIS — L97929 Non-pressure chronic ulcer of unspecified part of left lower leg with unspecified severity: Secondary | ICD-10-CM | POA: Diagnosis present

## 2018-04-19 DIAGNOSIS — K7010 Alcoholic hepatitis without ascites: Secondary | ICD-10-CM | POA: Diagnosis present

## 2018-04-19 DIAGNOSIS — K219 Gastro-esophageal reflux disease without esophagitis: Secondary | ICD-10-CM | POA: Diagnosis present

## 2018-04-19 DIAGNOSIS — L89150 Pressure ulcer of sacral region, unstageable: Secondary | ICD-10-CM | POA: Diagnosis present

## 2018-04-19 DIAGNOSIS — L97919 Non-pressure chronic ulcer of unspecified part of right lower leg with unspecified severity: Secondary | ICD-10-CM | POA: Diagnosis present

## 2018-04-19 DIAGNOSIS — G9341 Metabolic encephalopathy: Secondary | ICD-10-CM | POA: Diagnosis present

## 2018-04-19 DIAGNOSIS — Z89421 Acquired absence of other right toe(s): Secondary | ICD-10-CM

## 2018-04-19 DIAGNOSIS — Z885 Allergy status to narcotic agent status: Secondary | ICD-10-CM

## 2018-04-19 DIAGNOSIS — R748 Abnormal levels of other serum enzymes: Secondary | ICD-10-CM | POA: Diagnosis present

## 2018-04-19 DIAGNOSIS — L89892 Pressure ulcer of other site, stage 2: Secondary | ICD-10-CM | POA: Diagnosis present

## 2018-04-19 DIAGNOSIS — I272 Pulmonary hypertension, unspecified: Secondary | ICD-10-CM | POA: Diagnosis present

## 2018-04-19 DIAGNOSIS — I10 Essential (primary) hypertension: Secondary | ICD-10-CM | POA: Diagnosis present

## 2018-04-19 DIAGNOSIS — R339 Retention of urine, unspecified: Secondary | ICD-10-CM | POA: Diagnosis not present

## 2018-04-19 DIAGNOSIS — Z23 Encounter for immunization: Secondary | ICD-10-CM

## 2018-04-19 DIAGNOSIS — E871 Hypo-osmolality and hyponatremia: Secondary | ICD-10-CM | POA: Diagnosis present

## 2018-04-19 DIAGNOSIS — F10239 Alcohol dependence with withdrawal, unspecified: Secondary | ICD-10-CM | POA: Diagnosis present

## 2018-04-19 DIAGNOSIS — Z79899 Other long term (current) drug therapy: Secondary | ICD-10-CM

## 2018-04-19 DIAGNOSIS — B182 Chronic viral hepatitis C: Secondary | ICD-10-CM | POA: Diagnosis present

## 2018-04-19 DIAGNOSIS — S91104A Unspecified open wound of right lesser toe(s) without damage to nail, initial encounter: Secondary | ICD-10-CM | POA: Diagnosis present

## 2018-04-19 DIAGNOSIS — Z590 Homelessness: Secondary | ICD-10-CM

## 2018-04-20 ENCOUNTER — Inpatient Hospital Stay (HOSPITAL_COMMUNITY): Payer: Medicaid Other

## 2018-04-20 ENCOUNTER — Encounter (HOSPITAL_COMMUNITY): Payer: Self-pay

## 2018-04-20 ENCOUNTER — Other Ambulatory Visit: Payer: Self-pay

## 2018-04-20 ENCOUNTER — Emergency Department (HOSPITAL_COMMUNITY): Payer: Medicaid Other

## 2018-04-20 ENCOUNTER — Inpatient Hospital Stay (HOSPITAL_COMMUNITY)
Admission: EM | Admit: 2018-04-20 | Discharge: 2018-05-18 | DRG: 463 | Disposition: A | Discharge status: Skilled Nursing Facility | Payer: Medicaid Other | Attending: Internal Medicine | Admitting: Internal Medicine

## 2018-04-20 DIAGNOSIS — R339 Retention of urine, unspecified: Secondary | ICD-10-CM | POA: Diagnosis not present

## 2018-04-20 DIAGNOSIS — Z23 Encounter for immunization: Secondary | ICD-10-CM | POA: Diagnosis not present

## 2018-04-20 DIAGNOSIS — F1020 Alcohol dependence, uncomplicated: Secondary | ICD-10-CM

## 2018-04-20 DIAGNOSIS — R748 Abnormal levels of other serum enzymes: Secondary | ICD-10-CM | POA: Diagnosis present

## 2018-04-20 DIAGNOSIS — M869 Osteomyelitis, unspecified: Secondary | ICD-10-CM | POA: Diagnosis present

## 2018-04-20 DIAGNOSIS — F319 Bipolar disorder, unspecified: Secondary | ICD-10-CM | POA: Diagnosis present

## 2018-04-20 DIAGNOSIS — L8990 Pressure ulcer of unspecified site, unspecified stage: Secondary | ICD-10-CM

## 2018-04-20 DIAGNOSIS — B182 Chronic viral hepatitis C: Secondary | ICD-10-CM | POA: Diagnosis present

## 2018-04-20 DIAGNOSIS — F17290 Nicotine dependence, other tobacco product, uncomplicated: Secondary | ICD-10-CM | POA: Diagnosis present

## 2018-04-20 DIAGNOSIS — I10 Essential (primary) hypertension: Secondary | ICD-10-CM | POA: Diagnosis present

## 2018-04-20 DIAGNOSIS — L97919 Non-pressure chronic ulcer of unspecified part of right lower leg with unspecified severity: Secondary | ICD-10-CM | POA: Diagnosis present

## 2018-04-20 DIAGNOSIS — Z590 Homelessness: Secondary | ICD-10-CM | POA: Diagnosis not present

## 2018-04-20 DIAGNOSIS — S91109A Unspecified open wound of unspecified toe(s) without damage to nail, initial encounter: Secondary | ICD-10-CM

## 2018-04-20 DIAGNOSIS — I739 Peripheral vascular disease, unspecified: Secondary | ICD-10-CM

## 2018-04-20 DIAGNOSIS — R45851 Suicidal ideations: Secondary | ICD-10-CM | POA: Diagnosis not present

## 2018-04-20 DIAGNOSIS — I272 Pulmonary hypertension, unspecified: Secondary | ICD-10-CM | POA: Diagnosis present

## 2018-04-20 DIAGNOSIS — K7010 Alcoholic hepatitis without ascites: Secondary | ICD-10-CM | POA: Diagnosis present

## 2018-04-20 DIAGNOSIS — Z79899 Other long term (current) drug therapy: Secondary | ICD-10-CM | POA: Diagnosis not present

## 2018-04-20 DIAGNOSIS — S91104A Unspecified open wound of right lesser toe(s) without damage to nail, initial encounter: Secondary | ICD-10-CM | POA: Diagnosis present

## 2018-04-20 DIAGNOSIS — F1010 Alcohol abuse, uncomplicated: Secondary | ICD-10-CM

## 2018-04-20 DIAGNOSIS — L0390 Cellulitis, unspecified: Secondary | ICD-10-CM | POA: Diagnosis not present

## 2018-04-20 DIAGNOSIS — L89892 Pressure ulcer of other site, stage 2: Secondary | ICD-10-CM | POA: Diagnosis present

## 2018-04-20 DIAGNOSIS — K219 Gastro-esophageal reflux disease without esophagitis: Secondary | ICD-10-CM | POA: Diagnosis present

## 2018-04-20 DIAGNOSIS — F10239 Alcohol dependence with withdrawal, unspecified: Secondary | ICD-10-CM | POA: Diagnosis present

## 2018-04-20 DIAGNOSIS — G8929 Other chronic pain: Secondary | ICD-10-CM | POA: Diagnosis present

## 2018-04-20 DIAGNOSIS — E871 Hypo-osmolality and hyponatremia: Secondary | ICD-10-CM

## 2018-04-20 DIAGNOSIS — L97929 Non-pressure chronic ulcer of unspecified part of left lower leg with unspecified severity: Secondary | ICD-10-CM | POA: Diagnosis present

## 2018-04-20 DIAGNOSIS — L89150 Pressure ulcer of sacral region, unstageable: Secondary | ICD-10-CM | POA: Diagnosis present

## 2018-04-20 DIAGNOSIS — I5031 Acute diastolic (congestive) heart failure: Secondary | ICD-10-CM | POA: Diagnosis not present

## 2018-04-20 DIAGNOSIS — Z885 Allergy status to narcotic agent status: Secondary | ICD-10-CM | POA: Diagnosis not present

## 2018-04-20 DIAGNOSIS — G9341 Metabolic encephalopathy: Secondary | ICD-10-CM | POA: Diagnosis present

## 2018-04-20 DIAGNOSIS — Z89421 Acquired absence of other right toe(s): Secondary | ICD-10-CM | POA: Diagnosis not present

## 2018-04-20 DIAGNOSIS — M86171 Other acute osteomyelitis, right ankle and foot: Secondary | ICD-10-CM | POA: Diagnosis not present

## 2018-04-20 DIAGNOSIS — S81801A Unspecified open wound, right lower leg, initial encounter: Secondary | ICD-10-CM

## 2018-04-20 DIAGNOSIS — R52 Pain, unspecified: Secondary | ICD-10-CM

## 2018-04-20 LAB — URINALYSIS, ROUTINE W REFLEX MICROSCOPIC
Bilirubin Urine: NEGATIVE
Glucose, UA: NEGATIVE mg/dL
Ketones, ur: NEGATIVE mg/dL
Leukocytes,Ua: NEGATIVE
NITRITE: NEGATIVE
Protein, ur: NEGATIVE mg/dL
SPECIFIC GRAVITY, URINE: 1.004 — AB (ref 1.005–1.030)
pH: 6 (ref 5.0–8.0)

## 2018-04-20 LAB — BASIC METABOLIC PANEL
ANION GAP: 8 (ref 5–15)
ANION GAP: 9 (ref 5–15)
Anion gap: 11 (ref 5–15)
Anion gap: 11 (ref 5–15)
BUN: 13 mg/dL (ref 8–23)
BUN: 15 mg/dL (ref 8–23)
BUN: 16 mg/dL (ref 8–23)
BUN: 15 mg/dL (ref 8–23)
CO2: 22 mmol/L (ref 22–32)
CO2: 25 mmol/L (ref 22–32)
CO2: 26 mmol/L (ref 22–32)
CO2: 26 mmol/L (ref 22–32)
Calcium: 8.4 mg/dL — ABNORMAL LOW (ref 8.9–10.3)
Calcium: 8.5 mg/dL — ABNORMAL LOW (ref 8.9–10.3)
Calcium: 8.6 mg/dL — ABNORMAL LOW (ref 8.9–10.3)
Calcium: 8.7 mg/dL — ABNORMAL LOW (ref 8.9–10.3)
Chloride: 85 mmol/L — ABNORMAL LOW (ref 98–111)
Chloride: 85 mmol/L — ABNORMAL LOW (ref 98–111)
Chloride: 88 mmol/L — ABNORMAL LOW (ref 98–111)
Chloride: 89 mmol/L — ABNORMAL LOW (ref 98–111)
Creatinine, Ser: 0.77 mg/dL (ref 0.61–1.24)
Creatinine, Ser: 0.85 mg/dL (ref 0.61–1.24)
Creatinine, Ser: 0.86 mg/dL (ref 0.61–1.24)
Creatinine, Ser: 0.79 mg/dL (ref 0.61–1.24)
GFR calc Af Amer: >60 mL/min (ref >60)
GFR calc Af Amer: >60 mL/min (ref >60)
GFR calc Af Amer: >60 mL/min (ref >60)
GFR calc non Af Amer: >60 mL/min (ref >60)
GFR calc non Af Amer: >60 mL/min (ref >60)
GFR calc non Af Amer: >60 mL/min (ref >60)
GFR calc non Af Amer: >60 mL/min (ref >60)
Glucose, Bld: 93 mg/dL (ref 70–99)
Glucose, Bld: 104 mg/dL — ABNORMAL HIGH (ref 70–99)
Glucose, Bld: 107 mg/dL — ABNORMAL HIGH (ref 70–99)
Glucose, Bld: 91 mg/dL (ref 70–99)
Potassium: 3.8 mmol/L (ref 3.5–5.1)
Potassium: 3.7 mmol/L (ref 3.5–5.1)
Potassium: 3.8 mmol/L (ref 3.5–5.1)
Potassium: 3.9 mmol/L (ref 3.5–5.1)
SODIUM: 118 mmol/L — AB (ref 135–145)
SODIUM: 124 mmol/L — AB (ref 135–145)
Sodium: 121 mmol/L — ABNORMAL LOW (ref 135–145)
Sodium: 122 mmol/L — ABNORMAL LOW (ref 135–145)

## 2018-04-20 LAB — ECHOCARDIOGRAM COMPLETE

## 2018-04-20 LAB — PROTIME-INR
INR: 1.13
Prothrombin Time: 14.4 seconds (ref 11.4–15.2)

## 2018-04-20 LAB — ETHANOL: Alcohol, Ethyl (B): 83 mg/dL — ABNORMAL HIGH (ref <10)

## 2018-04-20 LAB — CBC
HEMATOCRIT: 29.2 % — AB (ref 39.0–52.0)
HEMOGLOBIN: 10 g/dL — AB (ref 13.0–17.0)
MCH: 34.4 pg — ABNORMAL HIGH (ref 26.0–34.0)
MCHC: 34.2 g/dL (ref 30.0–36.0)
MCV: 100.3 fL — ABNORMAL HIGH (ref 80.0–100.0)
Platelets: 214 10*3/uL (ref 150–400)
RBC: 2.91 MIL/uL — ABNORMAL LOW (ref 4.22–5.81)
RDW: 15.4 % (ref 11.5–15.5)
WBC: 6.9 10*3/uL (ref 4.0–10.5)
nRBC: 0 % (ref 0.0–0.2)

## 2018-04-20 LAB — COMPREHENSIVE METABOLIC PANEL
ALT: 57 U/L — ABNORMAL HIGH (ref 0–44)
AST: 198 U/L — AB (ref 15–41)
Albumin: 3 g/dL — ABNORMAL LOW (ref 3.5–5.0)
Alkaline Phosphatase: 107 U/L (ref 38–126)
Anion gap: 12 (ref 5–15)
BUN: 12 mg/dL (ref 8–23)
CO2: 22 mmol/L (ref 22–32)
Calcium: 8.7 mg/dL — ABNORMAL LOW (ref 8.9–10.3)
Chloride: 85 mmol/L — ABNORMAL LOW (ref 98–111)
Creatinine, Ser: 0.77 mg/dL (ref 0.61–1.24)
GFR calc Af Amer: >60 mL/min (ref >60)
GFR calc non Af Amer: >60 mL/min (ref >60)
GLUCOSE: 100 mg/dL — AB (ref 70–99)
Potassium: 3.8 mmol/L (ref 3.5–5.1)
Sodium: 119 mmol/L — CL (ref 135–145)
Total Bilirubin: 2.5 mg/dL — ABNORMAL HIGH (ref 0.3–1.2)
Total Protein: 7.2 g/dL (ref 6.5–8.1)

## 2018-04-20 LAB — RAPID URINE DRUG SCREEN, HOSP PERFORMED
Amphetamines: NOT DETECTED
BENZODIAZEPINES: NOT DETECTED
Barbiturates: NOT DETECTED
Cocaine: NOT DETECTED
Opiates: NOT DETECTED
Tetrahydrocannabinol: NOT DETECTED

## 2018-04-20 LAB — SALICYLATE LEVEL: Salicylate Lvl: <7 mg/dL (ref 2.8–30.0)

## 2018-04-20 LAB — MRSA PCR SCREENING: MRSA by PCR: NEGATIVE

## 2018-04-20 LAB — ACETAMINOPHEN LEVEL: Acetaminophen (Tylenol), Serum: 21 ug/mL (ref 10–30)

## 2018-04-20 LAB — APTT: aPTT: 38 seconds — ABNORMAL HIGH (ref 24–36)

## 2018-04-20 LAB — OSMOLALITY: OSMOLALITY: 262 mosm/kg — AB (ref 275–295)

## 2018-04-20 LAB — OSMOLALITY, URINE: Osmolality, Ur: 208 mOsm/kg — ABNORMAL LOW (ref 300–900)

## 2018-04-20 LAB — C-REACTIVE PROTEIN: CRP: <0.8 mg/dL (ref <1.0)

## 2018-04-20 LAB — SODIUM, URINE, RANDOM: Sodium, Ur: 55 mmol/L

## 2018-04-20 LAB — SEDIMENTATION RATE: Sed Rate: 60 mm/hr — ABNORMAL HIGH (ref 0–16)

## 2018-04-20 LAB — TSH: TSH: 0.454 u[IU]/mL (ref 0.350–4.500)

## 2018-04-20 LAB — HEMOGLOBIN A1C
Hgb A1c MFr Bld: 4.7 % — ABNORMAL LOW (ref 4.8–5.6)
Mean Plasma Glucose: 88.19 mg/dL

## 2018-04-20 LAB — BRAIN NATRIURETIC PEPTIDE: B Natriuretic Peptide: 108.5 pg/mL — ABNORMAL HIGH (ref 0.0–100.0)

## 2018-04-20 MED ORDER — GABAPENTIN 300 MG PO CAPS
300.0000 mg | ORAL_CAPSULE | Freq: Every day | ORAL | Status: DC
  Administered 2018-04-20 – 2018-04-23 (×4): 300 mg via ORAL
  Filled 2018-04-20 (×4): qty 1

## 2018-04-20 MED ORDER — VITAMIN B-1 100 MG PO TABS
100.0000 mg | ORAL_TABLET | Freq: Every day | ORAL | Status: DC
  Administered 2018-04-21 – 2018-04-22 (×2): 100 mg via ORAL
  Filled 2018-04-20 (×2): qty 1

## 2018-04-20 MED ORDER — ENOXAPARIN SODIUM 40 MG/0.4ML ~~LOC~~ SOLN
40.0000 mg | SUBCUTANEOUS | Status: DC
  Administered 2018-04-20 – 2018-04-23 (×4): 40 mg via SUBCUTANEOUS
  Filled 2018-04-20 (×4): qty 0.4

## 2018-04-20 MED ORDER — LORAZEPAM 2 MG/ML IJ SOLN
1.0000 mg | Freq: Four times a day (QID) | INTRAMUSCULAR | Status: DC | PRN
  Administered 2018-04-21 – 2018-04-22 (×3): 1 mg via INTRAVENOUS
  Filled 2018-04-20 (×3): qty 1

## 2018-04-20 MED ORDER — OXYCODONE HCL 5 MG PO TABS: 5.0000 mg | ORAL_TABLET | ORAL | Status: DC | PRN

## 2018-04-20 MED ORDER — ACETAMINOPHEN 650 MG RE SUPP: 650.0000 mg | Freq: Four times a day (QID) | RECTAL | Status: DC | PRN

## 2018-04-20 MED ORDER — FOLIC ACID 1 MG PO TABS
1.0000 mg | ORAL_TABLET | Freq: Every day | ORAL | Status: DC
  Administered 2018-04-20 – 2018-04-23 (×4): 1 mg via ORAL
  Filled 2018-04-20 (×4): qty 1

## 2018-04-20 MED ORDER — POLYETHYLENE GLYCOL 3350 17 G PO PACK: 17.0000 g | PACK | Freq: Every day | ORAL | Status: DC | PRN

## 2018-04-20 MED ORDER — ACETAMINOPHEN 325 MG PO TABS: 650.0000 mg | ORAL_TABLET | Freq: Four times a day (QID) | ORAL | Status: DC | PRN

## 2018-04-20 MED ORDER — LORAZEPAM 1 MG PO TABS: 0.0000 mg | ORAL_TABLET | Freq: Two times a day (BID) | ORAL | Status: DC

## 2018-04-20 MED ORDER — PANTOPRAZOLE SODIUM 40 MG PO TBEC
40.0000 mg | DELAYED_RELEASE_TABLET | Freq: Every day | ORAL | Status: DC
  Administered 2018-04-20 – 2018-05-18 (×29): 40 mg via ORAL
  Filled 2018-04-20 (×29): qty 1

## 2018-04-20 MED ORDER — AMLODIPINE BESYLATE 5 MG PO TABS
5.0000 mg | ORAL_TABLET | Freq: Every day | ORAL | Status: DC
  Administered 2018-04-21 – 2018-05-18 (×28): 5 mg via ORAL
  Filled 2018-04-20 (×28): qty 1

## 2018-04-20 MED ORDER — ACETAMINOPHEN 325 MG PO TABS
650.0000 mg | ORAL_TABLET | Freq: Four times a day (QID) | ORAL | Status: DC | PRN
  Administered 2018-04-22 – 2018-05-02 (×6): 650 mg via ORAL
  Filled 2018-04-20 (×7): qty 2

## 2018-04-20 MED ORDER — LORAZEPAM 1 MG PO TABS: 1.0000 mg | ORAL_TABLET | Freq: Four times a day (QID) | ORAL | Status: DC | PRN

## 2018-04-20 MED ORDER — NICOTINE 7 MG/24HR TD PT24
7.0000 mg | MEDICATED_PATCH | Freq: Every day | TRANSDERMAL | Status: DC | PRN
  Filled 2018-04-20: qty 1

## 2018-04-20 MED ORDER — ADULT MULTIVITAMIN W/MINERALS CH
1.0000 | ORAL_TABLET | Freq: Every day | ORAL | Status: DC
  Administered 2018-04-20 – 2018-04-23 (×4): 1 via ORAL
  Filled 2018-04-20 (×4): qty 1

## 2018-04-20 MED ORDER — LOSARTAN POTASSIUM 50 MG PO TABS
100.0000 mg | ORAL_TABLET | Freq: Once | ORAL | Status: AC
  Administered 2018-04-20: 100 mg via ORAL
  Filled 2018-04-20: qty 2

## 2018-04-20 MED ORDER — LOSARTAN POTASSIUM 50 MG PO TABS
100.0000 mg | ORAL_TABLET | Freq: Every day | ORAL | Status: DC
  Administered 2018-04-21 – 2018-05-18 (×28): 100 mg via ORAL
  Filled 2018-04-20 (×29): qty 2

## 2018-04-20 MED ORDER — THIAMINE HCL 100 MG/ML IJ SOLN
100.0000 mg | Freq: Every day | INTRAMUSCULAR | Status: DC
  Administered 2018-04-23: 100 mg via INTRAVENOUS
  Filled 2018-04-20: qty 2

## 2018-04-20 MED ORDER — FUROSEMIDE 10 MG/ML IJ SOLN
20.0000 mg | Freq: Once | INTRAMUSCULAR | Status: AC
  Administered 2018-04-20: 20 mg via INTRAVENOUS
  Filled 2018-04-20: qty 4

## 2018-04-20 MED ORDER — LORAZEPAM 1 MG PO TABS
0.0000 mg | ORAL_TABLET | Freq: Four times a day (QID) | ORAL | Status: DC
  Administered 2018-04-20: 2 mg via ORAL
  Administered 2018-04-20 – 2018-04-21 (×3): 1 mg via ORAL
  Filled 2018-04-20: qty 2
  Filled 2018-04-20 (×4): qty 1

## 2018-04-20 MED ORDER — IBUPROFEN 200 MG PO TABS
400.0000 mg | ORAL_TABLET | Freq: Once | ORAL | Status: AC
  Administered 2018-04-20: 400 mg via ORAL
  Filled 2018-04-20: qty 2

## 2018-04-20 MED ORDER — INFLUENZA VAC SPLIT QUAD 0.5 ML IM SUSY: 0.5000 mL | PREFILLED_SYRINGE | INTRAMUSCULAR | Status: DC

## 2018-04-20 MED ORDER — LABETALOL HCL 5 MG/ML IV SOLN
10.0000 mg | INTRAVENOUS | Status: DC | PRN
  Administered 2018-04-20: 10 mg via INTRAVENOUS
  Filled 2018-04-20: qty 4

## 2018-04-20 MED ORDER — VITAMIN B-1 100 MG PO TABS
100.0000 mg | ORAL_TABLET | Freq: Once | ORAL | Status: AC
  Administered 2018-04-20: 100 mg via ORAL
  Filled 2018-04-20: qty 1

## 2018-04-20 MED ORDER — THIAMINE HCL 100 MG PO TABS: 100.0000 mg | ORAL_TABLET | Freq: Every day | ORAL | Status: DC

## 2018-04-20 NOTE — ED Notes (Signed)
Attempted to call report. RN in pt room will call back for report.

## 2018-04-20 NOTE — ED Notes (Signed)
ED TO INPATIENT HANDOFF REPORT  Name/Age/Gender Mark Ayala 63 y.o. male  Code Status Code Status History    Date Active Date Inactive Code Status Order ID Comments User Context   10/14/2017 1738 10/16/2017 1802 Partial Code [id number]  Alma Friendly, MD Inpatient    Questions for Most Recent Historical Code Status (Order [id number])    Question Answer Comment   In the event of cardiac or respiratory ARREST: Initiate Code Blue, Call Rapid Response Yes    In the event of cardiac or respiratory ARREST: Perform CPR Yes    In the event of cardiac or respiratory ARREST: Perform Intubation/Mechanical Ventilation No    In the event of cardiac or respiratory ARREST: Use NIPPV/BiPAp only if indicated Yes    In the event of cardiac or respiratory ARREST: Administer ACLS medications if indicated Yes    In the event of cardiac or respiratory ARREST: Perform Defibrillation or Cardioversion if indicated Yes       Home/SNF/Other [place]  Chief Complaint Detox  Level of Care/Admitting Diagnosis ED Disposition    ED Disposition Condition [place]: [place] [[id number]]  Level of Care: Stepdown [14]  Admit to SDU based on following criteria: Severe physiological/psychological symptoms:  Any diagnosis requiring assessment & intervention at least every 4 hours on an ongoing basis to obtain desired patient outcomes including stability and rehabilitation  Diagnosis: Hyponatremia [086578]  Admitting Physician: Elodia Florence [phone]  Attending Physician: Cephus Slater, A CALDWELL [phone]  Estimated length of stay: past midnight tomorrow  Certification:: I certify this patient will need inpatient services for at least 2 midnights  PT Class (Do Not Modify): Inpatient [101]  PT Acc Code (Do Not Modify): Private [1]       Medical History Past Medical History:  Diagnosis Date  . Arthritis   . Depression   . GERD (gastroesophageal reflux  disease)   . Hepatitis    Hepititis C  . Hypertension     Allergies Allergies  Allergen Reactions  . Codeine Other (See Comments)    Caused Hyperactivity    IV Location/Drains/Wounds Patient Lines/Drains/Airways Status   Active Line/Drains/Airways    Name:   Placement date:   Placement time:   Site:   Days:   Peripheral IV 04/20/18 Left Forearm   04/20/18    0758    Forearm   less than 1   AIRWAYS   02/10/16    1107     800   AIRWAYS   02/10/16    1029     800   Airway   02/10/16    1107     800          Labs/Imaging Results for orders placed or performed during the hospital encounter of 04/20/18 (from the past 48 hour(s))  Comprehensive metabolic panel     Status: Abnormal   Collection Time: 04/20/18  5:54 AM  Result Value Ref Range   Sodium 119 (LL) 135 - 145 mmol/L    Comment: REPEATED TO VERIFY CRITICAL RESULT CALLED TO, READ BACK BY AND VERIFIED WITH: OXENDINE,J. RN AT 2440 04/20/18 MULLINS,T    Potassium 3.8 3.5 - 5.1 mmol/L   Chloride 85 (L) 98 - 111 mmol/L   CO2 22 22 - 32 mmol/L   Glucose, Bld 100 (H) 70 - 99 mg/dL   BUN 12 8 - 23 mg/dL   Creatinine, Ser 0.77 0.61 - 1.24 mg/dL   Calcium  8.7 (L) 8.9 - 10.3 mg/dL   Total Protein 7.2 6.5 - 8.1 g/dL   Albumin 3.0 (L) 3.5 - 5.0 g/dL   AST 198 (H) 15 - 41 U/L   ALT 57 (H) 0 - 44 U/L   Alkaline Phosphatase 107 38 - 126 U/L   Total Bilirubin 2.5 (H) 0.3 - 1.2 mg/dL   GFR calc non Af Amer >60 >60 mL/min   GFR calc Af Amer >60 >60 mL/min   Anion gap 12 5 - 15    Comment: Performed at [facility], [place] [address] [place], [place] [address]  Ethanol     Status: Abnormal   Collection Time: 04/20/18  5:54 AM  Result Value Ref Range   Alcohol, Ethyl (B) 83 (H) <10 mg/dL    Comment: (NOTE) Lowest detectable limit for serum alcohol is 10 mg/dL. For medical purposes only. Performed at [facility], [place] [address] [place], [place] [address]   cbc     Status: Abnormal    Collection Time: 04/20/18  5:54 AM  Result Value Ref Range   WBC 6.9 4.0 - 10.5 K/uL   RBC 2.91 (L) 4.22 - 5.81 MIL/uL   Hemoglobin 10.0 (L) 13.0 - 17.0 g/dL   HCT 29.2 (L) 39.0 - 52.0 %   MCV 100.3 (H) 80.0 - 100.0 fL   MCH 34.4 (H) 26.0 - 34.0 pg   MCHC 34.2 30.0 - 36.0 g/dL   RDW 15.4 11.5 - 15.5 %   Platelets 214 150 - 400 K/uL   nRBC 0.0 0.0 - 0.2 %    Comment: Performed at [facility], [place] [address] [place], [place] [address]  Rapid urine drug screen (hospital performed)     Status: None   Collection Time: 04/20/18  5:54 AM  Result Value Ref Range   Opiates NONE DETECTED NONE DETECTED   Cocaine NONE DETECTED NONE DETECTED   Benzodiazepines NONE DETECTED NONE DETECTED   Amphetamines NONE DETECTED NONE DETECTED   Tetrahydrocannabinol NONE DETECTED NONE DETECTED   Barbiturates NONE DETECTED NONE DETECTED    Comment: (NOTE) DRUG SCREEN FOR MEDICAL PURPOSES ONLY.  IF CONFIRMATION IS NEEDED FOR ANY PURPOSE, NOTIFY LAB WITHIN 5 DAYS. LOWEST DETECTABLE LIMITS FOR URINE DRUG SCREEN Drug Class                     Cutoff (ng/mL) Amphetamine and metabolites    1000 Barbiturate and metabolites    200 Benzodiazepine                 416 Tricyclics and metabolites     300 Opiates and metabolites        300 Cocaine and metabolites        300 THC                            50 Performed at [facility], [place] [address], [place], [place] [address]   Salicylate level     Status: None   Collection Time: 04/20/18  5:54 AM  Result Value Ref Range   Salicylate Lvl <6.4 2.8 - 30.0 mg/dL    Comment: Performed at [facility], [place] [address], [place], [place] [address]  Acetaminophen level     Status: None   Collection Time: 04/20/18  5:54 AM  Result Value Ref Range   Acetaminophen (Tylenol), Serum 21 10 - 30 ug/mL  Comment: (NOTE) Therapeutic concentrations vary significantly. A range of 10-30 ug/mL  may be an effective  concentration for many patients. However, some  are best treated at concentrations outside of this range. Acetaminophen concentrations >150 ug/mL at 4 hours after ingestion  and >50 ug/mL at 12 hours after ingestion are often associated with  toxic reactions. Performed at [facility], [place] [address], [place], [place] [address]   Brain natriuretic peptide     Status: Abnormal   Collection Time: 04/20/18  5:54 AM  Result Value Ref Range   B Natriuretic Peptide 108.5 (H) 0.0 - 100.0 pg/mL    Comment: Performed at [facility], [place] [address] [place], [place] [address]  Protime-INR     Status: None   Collection Time: 04/20/18  7:57 AM  Result Value Ref Range   Prothrombin Time 14.4 11.4 - 15.2 seconds   INR 1.13     Comment: Performed at [facility], [place] [address] [place], [place] [address]  APTT     Status: Abnormal   Collection Time: 04/20/18  7:57 AM  Result Value Ref Range   aPTT 38 (H) 24 - 36 seconds    Comment:        IF BASELINE aPTT IS ELEVATED, SUGGEST PATIENT RISK ASSESSMENT BE USED TO DETERMINE APPROPRIATE ANTICOAGULANT THERAPY. Performed at [facility], [place] [address] [place], [place] [address]   Osmolality     Status: Abnormal   Collection Time: 04/20/18  8:13 AM  Result Value Ref Range   Osmolality 262 (L) 275 - 295 mOsm/kg    Comment: Performed at [place] [address], [place], [place] [address]  Basic metabolic panel     Status: Abnormal   Collection Time: 04/20/18  8:13 AM  Result Value Ref Range   Sodium 118 (LL) 135 - 145 mmol/L    Comment: CRITICAL RESULT CALLED TO, READ BACK BY AND VERIFIED WITH: SMITH,T. RN @0902  ON 2.12.20 BY NMCCOY    Potassium 3.8 3.5 - 5.1 mmol/L   Chloride 85 (L) 98 - 111 mmol/L   CO2 22 22 - 32 mmol/L   Glucose, Bld 93 70 - 99 mg/dL   BUN 13 8 - 23 mg/dL   Creatinine, Ser 0.77 0.61 - 1.24 mg/dL   Calcium 8.4 (L) 8.9 - 10.3 mg/dL    GFR calc non Af Amer >60 >60 mL/min   GFR calc Af Amer >60 >60 mL/min   Anion gap 11 5 - 15    Comment: Performed at [facility], [place] [address] [place], [place] [address]  Sodium, urine, random     Status: None   Collection Time: 04/20/18  8:50 AM  Result Value Ref Range   Sodium, Ur 55 mmol/L    Comment: Performed at [facility], [place] [address] [place], [place] [address]  Osmolality, urine     Status: Abnormal   Collection Time: 04/20/18  8:50 AM  Result Value Ref Range   Osmolality, Ur 208 (L) 300 - 900 mOsm/kg    Comment: Performed at [place] [address] [place], [place] [address]  Urinalysis, Routine w reflex microscopic     Status: Abnormal   Collection Time: 04/20/18  8:50 AM  Result Value Ref Range   Color, Urine STRAW (A) YELLOW   APPearance CLEAR CLEAR   Specific Gravity, Urine 1.004 (L) 1.005 - 1.030   pH 6.0 5.0 -  8.0   Glucose, UA NEGATIVE NEGATIVE mg/dL   Hgb urine dipstick MODERATE (A) NEGATIVE   Bilirubin Urine NEGATIVE NEGATIVE   Ketones, ur NEGATIVE NEGATIVE mg/dL   Protein, ur NEGATIVE NEGATIVE mg/dL   Nitrite NEGATIVE NEGATIVE   Leukocytes,Ua NEGATIVE NEGATIVE   RBC / HPF 6-10 0 - 5 RBC/hpf   WBC, UA 0-5 0 - 5 WBC/hpf   Bacteria, UA RARE (A) NONE SEEN   Mucus PRESENT     Comment: Performed at [facility], [place] [address] [place] [address] [place], [place] [address]  Sedimentation rate     Status: Abnormal   Collection Time: 04/20/18 10:16 AM  Result Value Ref Range   Sed Rate 60 (H) 0 - 16 mm/hr    Comment: Performed at [facility], [place] [address], [place], [place] [address]  C-reactive protein     Status: None   Collection Time: 04/20/18 10:16 AM  Result Value Ref Range   CRP <0.8 <1.0 mg/dL    Comment: Performed at [facility], [place] [address] [place], [place] [address]   Dg Foot Complete Right  Result Date: 04/20/2018 CLINICAL DATA:  Swollen right  foot.  No specific injury. EXAM: RIGHT FOOT COMPLETE - 3+ VIEW COMPARISON:  None. FINDINGS: Diffuse soft tissue swelling is noted, mainly along the dorsum of the foot. There are mild degenerative changes but no acute bony findings or destructive bony changes. Degenerative changes at the first MTP joint with hallux valgus deformity. Small calcaneal heel spur. IMPRESSION: Mild degenerative changes but no acute bony findings. Electronically Signed   By: [place]jo Sanes M.D.   On: 04/20/2018 11:31   Vas [place] Burnard Bunting With/wo Tbi  Result Date: 04/20/2018 LOWER EXTREMITY DOPPLER STUDY Indications: Ulceration, and peripheral artery disease.  Performing Technologist: Birdena Crandall, [place] RVS  Examination Guidelines: A complete evaluation includes at minimum, Doppler waveform signals and systolic blood pressure reading at the level of bilateral brachial, anterior tibial, and posterior tibial arteries, when vessel segments are accessible. Bilateral testing is considered an integral part of a complete examination. Photoelectric Plethysmograph (PPG) waveforms and toe systolic pressure readings are included as required and additional duplex testing as needed. Limited examinations for reoccurring indications may be performed as noted.  ABI Findings: +---------+------------------+-----+---------+---------------------------------+ Right    Rt Pressure (mmHg)IndexWaveform Comment                           +---------+------------------+-----+---------+---------------------------------+ Brachial 180                    triphasic                                  +---------+------------------+-----+---------+---------------------------------+ PTA      246               1.28 biphasic                                   +---------+------------------+-----+---------+---------------------------------+ DP       245               1.28 biphasic                                    +---------+------------------+-----+---------+---------------------------------+ Great Toe  Unable to obtain due to constant                                           involuntary movement of the foot  +---------+------------------+-----+---------+---------------------------------+ +---------+------------------+-----+----------+--------------------------------+ Left     Lt Pressure (mmHg)IndexWaveform  Comment                          +---------+------------------+-----+----------+--------------------------------+ Brachial 192                    triphasic                                  +---------+------------------+-----+----------+--------------------------------+ PTA      221               1.15 monophasic                                 +---------+------------------+-----+----------+--------------------------------+ DP       199               1.04 monophasic                                 +---------+------------------+-----+----------+--------------------------------+ Great Toe                                 Unable to obtan due to constan                                             involuntary movement of the foot +---------+------------------+-----+----------+--------------------------------+ +-------+-----------+--------------------------------+------------+------------+ ABI/TBIToday's ABIToday's TBI                     Previous ABIPrevious TBI +-------+-----------+--------------------------------+------------+------------+ Right  1.28       Unable to obtain see comment                                               above                                                    +-------+-----------+--------------------------------+------------+------------+ Left   1.15       Unable to obtain see comment                                               above                                                     +-------+-----------+--------------------------------+------------+------------+  Summary: Right: Resting right ankle-brachial index is within normal range. No evidence of significant right lower extremity arterial disease. Left: Resting left ankle-brachial index is within normal range. No evidence of significant left lower extremity arterial disease.  *See table(s) above for measurements and observations.     Preliminary    Dg Hip Unilat With Pelvis 2-3 Views Left  Result Date: 04/20/2018 CLINICAL DATA:  Left hip region pain EXAM: DG HIP (WITH OR WITHOUT PELVIS) 2-3V LEFT COMPARISON:  None. FINDINGS: Frontal pelvis as well as frontal and lateral left hip images were obtained. There is no acute fracture or dislocation. There is marked narrowing of the left hip joint. There are cystic changes in the left femoral head with areas of intermingled sclerosis and slight cortical irregularity. There is moderate narrowing of the right hip joint. Sacroiliac joints appear normal bilaterally. No bony destruction. There are soft tissue calcifications in the midline perineal region. 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 There is milder osteoarthritic change in the right hip joint. No fracture or dislocation. Small calcifications in the midline perineum, likely due to chronic inflammation. Electronically Signed   By: Lowella Grip III M.D.   On: 04/20/2018 10:02    Pending Labs Unresulted Labs (From admission, onward)    Start     Ordered   04/21/18 0500  Hepatitis panel, acute  Tomorrow morning,   R     04/20/18 1349   04/20/18 1352  Hepatitis c vrs RNA detect by PCR-qual  Add-on,   R     04/20/18 1351   04/20/18 1327  TSH  Add-on,   R     04/20/18 1326   04/20/18 1325  Hemoglobin A1c  Add-on,   R     04/20/18 1324   04/20/18 5784  Basic metabolic panel  ONCE - STAT,   R     04/20/18 1245   04/20/18  0806  Urea nitrogen, urine  Once,   R     04/20/18 0805   Signed and Held  CBC  (enoxaparin (LOVENOX)    CrCl >/= 30 ml/min)  Once,   R    Comments:  Baseline for enoxaparin therapy IF NOT ALREADY DRAWN.  Notify MD if PLT < 100 K.    Signed and Held   Signed and Held  Creatinine, serum  (enoxaparin (LOVENOX)    CrCl >/= 30 ml/min)  Once,   R    Comments:  Baseline for enoxaparin therapy IF NOT ALREADY DRAWN.    Signed and Held   Signed and Held  Creatinine, serum  (enoxaparin (LOVENOX)    CrCl >/= 30 ml/min)  Weekly,   R    Comments:  while on enoxaparin therapy    Signed and Held   Signed and Held  Comprehensive metabolic panel  Tomorrow morning,   R     Signed and Held   Signed and Held  CBC  Tomorrow morning,   R     Signed and Held   Signed and Held  Basic metabolic panel  Now then every 4 hours,   R     Signed and Held          Vitals/Pain Today's Vitals   04/20/18 1157 04/20/18 1200 04/20/18 1300 04/20/18 1330  BP:  (!) 180/90 (!) 189/90   Pulse: (!) 121 (!) 108 (!) 103 (!) 107  Resp: 17 16 (!) 21  18  Temp:      TempSrc:      SpO2: 95% 95% 95% 96%  PainSc:   Asleep     Isolation Precautions No active isolations  Medications Medications  nicotine (NICODERM CQ - dosed in mg/24 hr) patch 7 mg (has no administration in time range)  LORazepam (ATIVAN) tablet 1 mg (has no administration in time range)    Or  LORazepam (ATIVAN) injection 1 mg (has no administration in time range)  thiamine (VITAMIN B-1) tablet 100 mg (100 mg Oral Not Given 04/20/18 1007)    Or  thiamine (B-1) injection 100 mg ( Intravenous See Alternative 1/73/56 7014)  folic acid (FOLVITE) tablet 1 mg (1 mg Oral Given 04/20/18 1101)  multivitamin with minerals tablet 1 tablet (1 tablet Oral Given 04/20/18 1101)  LORazepam (ATIVAN) tablet 0-4 mg (1 mg Oral Given 04/20/18 1101)    Followed by  LORazepam (ATIVAN) tablet 0-4 mg (has no administration in time range)  Influenza vac split quadrivalent PF  (FLUARIX) injection 0.5 mL (has no administration in time range)  gabapentin (NEURONTIN) capsule 300 mg (has no administration in time range)  acetaminophen (TYLENOL) tablet 650 mg (has no administration in time range)  oxyCODONE (Oxy IR/ROXICODONE) immediate release tablet 5 mg (has no administration in time range)  thiamine (VITAMIN B-1) tablet 100 mg (100 mg Oral Given 04/20/18 0802)  ibuprofen (ADVIL,MOTRIN) tablet 400 mg (400 mg Oral Given 04/20/18 0802)  furosemide (LASIX) injection 20 mg (20 mg Intravenous Given 04/20/18 0803)    Mobility walks with device

## 2018-04-20 NOTE — Progress Notes (Signed)
Attempted patient for MRI Toes w/o. Patient is unable to cooperate and hold still, despite repeated efforts of telling the patient the importance of holding still. 2 different coils were tried with sandbags to assist with patient holding still. Multiple attempts were made to scan patient. Explained to patient the importance of being still and  that he is not a candidate for MRI until he can be able to hold still for 15 min.  bhj

## 2018-04-20 NOTE — ED Notes (Signed)
Bed: WA04 Expected date:  Expected time:  Means of arrival:  Comments: 

## 2018-04-20 NOTE — ED Notes (Signed)
Hospitalist at bedside 

## 2018-04-20 NOTE — Consult Note (Signed)
[place] Nurse wound consult note Patient receiving care in [facility] ED 04.  No family present.  Patient states he has had wounds on the right lower extremity for "about two years" and he has been cleaning the right second toe "every day" but it's not getting better.  He denies diabetes. Reason for Consult: LE wounds Wound type: Unclear etiology, perhaps venous insufficiency, but perhaps arterial insufficiency as well.  Right lower leg gaiter area has scattered ulcerations with yellow bases.  I do not see much in the way of drainage from the wounds.  The entire lower leg is edematous, erythematous, tender to touch.  The LLE is also edematous and a bit erythematous, but not nearly as much as the RLE.  I do not see any studies in the patient record where blood flow to the RLE has been evaluated.  Also, the right second toe is edematous, erythematous, and there is a wound on that toe where the great toe rests against it.  It has a very foul odor and heavy yellow drainage. Dressing procedure/placement/frequency: Cleanse the RLE wounds with soap and water. Pat dry. Apply small foam dressings over the wounds.  Change daily.  For the right second toe wound, cleanse it with soap and water.  Apply Aquacel Ag+ Kellie Simmering # [phone]) to the wound, secure with kerlex. Change daily. I spoke directly with Dr. Florene Glen about my concerns.  Additional diagnostics will be ordered by him. Monitor the wound area(s) for worsening of condition such as: Signs/symptoms of infection,  Increase in size,  Development of or worsening of odor, Development of pain, or increased pain at the affected locations.  Notify the medical team if any of these develop.  Thank you for the consult.  Discussed plan of care with the patient and bedside nurse.  [place] nurse will not follow at this time.  Please re-consult the [place] team if needed.  Val Riles, RN, MSN, CWOCN, CNS-BC, pager [phone]

## 2018-04-20 NOTE — Progress Notes (Signed)
Bilateral ABIs completed - preliminary results in [facility] review CV PROC. [place] Dossie Swor,RVS 04/20/2018 1:26 PM

## 2018-04-20 NOTE — ED Notes (Signed)
Pt provided with meal at this time

## 2018-04-20 NOTE — ED Notes (Signed)
Patient transported to MRI 

## 2018-04-20 NOTE — ED Notes (Signed)
MRI to come get pt in 10-15mins. Pt made aware. Pt denies complications from MRI

## 2018-04-20 NOTE — ED Triage Notes (Signed)
Pt arrived stating he needs detox from alcohol.Last drink was 1600 04/19/18. Pt stating he is suicidal with no plan.

## 2018-04-20 NOTE — ED Notes (Signed)
Pt dressed out in burgundy scrubs and yellow socks, belongings placed in bags behind nurses station
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
## 2018-04-20 NOTE — ED Notes (Signed)
Pt made aware that urine collection is needed  

## 2018-04-20 NOTE — ED Notes (Signed)
Sitter at bedside.

## 2018-04-20 NOTE — ED Notes (Signed)
Date and time results received: 04/20/18 0902 (use smartphrase ".now" to insert current time)  Test: Na+ Critical Value: 118 Name of Provider Notified: Dr. Lenoria Chime  Orders Received? Or Actions Taken?:

## 2018-04-21 ENCOUNTER — Inpatient Hospital Stay (HOSPITAL_COMMUNITY): Payer: Medicaid Other

## 2018-04-21 LAB — CBC
HCT: 26.8 % — ABNORMAL LOW (ref 39.0–52.0)
Hemoglobin: 9 g/dL — ABNORMAL LOW (ref 13.0–17.0)
MCH: 34.9 pg — ABNORMAL HIGH (ref 26.0–34.0)
MCHC: 33.6 g/dL (ref 30.0–36.0)
MCV: 103.9 fL — ABNORMAL HIGH (ref 80.0–100.0)
NRBC: 0 % (ref 0.0–0.2)
Platelets: 145 10*3/uL — ABNORMAL LOW (ref 150–400)
RBC: 2.58 MIL/uL — ABNORMAL LOW (ref 4.22–5.81)
RDW: 15.6 % — ABNORMAL HIGH (ref 11.5–15.5)
WBC: 6.6 10*3/uL (ref 4.0–10.5)

## 2018-04-21 LAB — COMPREHENSIVE METABOLIC PANEL
ALT: 50 U/L — ABNORMAL HIGH (ref 0–44)
AST: 165 U/L — ABNORMAL HIGH (ref 15–41)
Albumin: 2.6 g/dL — ABNORMAL LOW (ref 3.5–5.0)
Alkaline Phosphatase: 96 U/L (ref 38–126)
Anion gap: 9 (ref 5–15)
BUN: 16 mg/dL (ref 8–23)
CO2: 26 mmol/L (ref 22–32)
Calcium: 8.5 mg/dL — ABNORMAL LOW (ref 8.9–10.3)
Chloride: 90 mmol/L — ABNORMAL LOW (ref 98–111)
Creatinine, Ser: 0.85 mg/dL (ref 0.61–1.24)
GFR calc Af Amer: >60 mL/min (ref >60)
GFR calc non Af Amer: >60 mL/min (ref >60)
Glucose, Bld: 111 mg/dL — ABNORMAL HIGH (ref 70–99)
Potassium: 3.7 mmol/L (ref 3.5–5.1)
SODIUM: 125 mmol/L — AB (ref 135–145)
Total Bilirubin: 2.4 mg/dL — ABNORMAL HIGH (ref 0.3–1.2)
Total Protein: 6.2 g/dL — ABNORMAL LOW (ref 6.5–8.1)

## 2018-04-21 LAB — BASIC METABOLIC PANEL
Anion gap: 9 (ref 5–15)
BUN: 15 mg/dL (ref 8–23)
CO2: 26 mmol/L (ref 22–32)
Calcium: 8.5 mg/dL — ABNORMAL LOW (ref 8.9–10.3)
Chloride: 91 mmol/L — ABNORMAL LOW (ref 98–111)
Creatinine, Ser: 0.85 mg/dL (ref 0.61–1.24)
GFR calc Af Amer: >60 mL/min (ref >60)
GLUCOSE: 120 mg/dL — AB (ref 70–99)
Potassium: 3.6 mmol/L (ref 3.5–5.1)
Sodium: 126 mmol/L — ABNORMAL LOW (ref 135–145)

## 2018-04-21 LAB — UREA NITROGEN, URINE: Urea Nitrogen, Ur: 102 mg/dL

## 2018-04-21 MED ORDER — ORAL CARE MOUTH RINSE
15.0000 mL | Freq: Two times a day (BID) | OROMUCOSAL | Status: DC
  Administered 2018-04-21 – 2018-05-16 (×43): 15 mL via OROMUCOSAL

## 2018-04-21 MED ORDER — CHLORDIAZEPOXIDE HCL 25 MG PO CAPS
25.0000 mg | ORAL_CAPSULE | Freq: Once | ORAL | Status: AC
  Administered 2018-04-21: 25 mg via ORAL
  Filled 2018-04-21: qty 1

## 2018-04-21 MED ORDER — ENSURE ENLIVE PO LIQD
237.0000 mL | ORAL | Status: DC
  Administered 2018-04-22 – 2018-05-03 (×11): 237 mL via ORAL

## 2018-04-21 MED ORDER — LORAZEPAM 2 MG/ML IJ SOLN
2.0000 mg | Freq: Once | INTRAMUSCULAR | Status: AC
  Administered 2018-04-21: 2 mg via INTRAVENOUS

## 2018-04-21 MED ORDER — LORAZEPAM 2 MG/ML IJ SOLN
INTRAMUSCULAR | Status: AC
  Administered 2018-04-21: 2 mg via INTRAVENOUS
  Filled 2018-04-21: qty 1

## 2018-04-21 NOTE — Progress Notes (Signed)
[facility] PROGRESS NOTE  Mark Ayala ZOX:[id number] DOB: [birth year] DOA: 04/20/2018 PCP: Mark Blocker, MD   Narrative: 63 year old Caucasian male Known chronic hep C (possibly treated in the past), HTN, bipolar, pulmonary hypertension EF 65-70% 10/2017 Significant EtOH abuse drinks about 12 cans of beer Last hospitalized 10/14/17-10/16/17 AKI as well as ethanolism >5 emergency room visits since September last year mostly for falls and orthopedic related issues related to these falls in the setting of intoxication  Presented to emergency room with desire for withdrawal from alcohol Also noted to have apparent vomiting of blood no dark stool  Emergency room work-up = sodium 122 chloride 88 BUN/creatinine normal hemoglobin 9.0 MCV 102 no white count  A & Plan Hypervolemic hyponatremia  Lasix given x1 Recheck labs a.m. Weight in outpatient setting 11/2017 was 189 he is less than today Ethanolism Continue CIWA protocol with Ativan-ranging from 1-8 monitor closely may need Librium load in addition He has been somnolent most of the day Continue gabapentin to help with withdrawals Treated hep C As an outpatient would consider viral loads to see if he is truly cured Pulmonary hypertension Echo performed this admission shows EF 81% likely diastolic dysfunction Continue Lasix lower dose 40 mg daily, Cozaar 100 daily, labetalol as needed blood pressures above 190, amlodipine 5 daily HTN See above Bipolar previously on Celexa 20 mg would continue when more awake Lower extremity wounds Sacral pressure injury and tibia posterior right unstageable tissue loss--- not examined today    DVT Lovenox code Status: Presumed full CODE STATUS communication: None disposition Plan: Mark Sidles, MD  [facility] Via [employer] app OR [email or web address] 7PM-7AM contact night coverage as above 04/21/2018, 7:55 AM  LOS: 1 day   Consultants:  None at this  time  Procedures:  No  Antimicrobials:  No  Interval history/Subjective: Intermittently somnolent Objective Desitin 3-4 times a day with out much success.  She was in the restroom now he is asleep  Objective:  Vitals:  Vitals:   04/21/18 0500 04/21/18 0600  BP: (!) 157/68 (!) 169/78  Pulse: (!) 103 95  Resp: (!) 21 (!) 21  Temp:    SpO2: 95% 92%    Exam:  EOMI NCAT moderate dentition S1-S2 slightly tachycardic at rest abdomen soft I deferred the rest of his exam as he was sleeping   I have personally reviewed the following:  DATA   Labs:  Sodium up from 1 22-1 25 chloride up from 88-90  AST 165 ALT 50 total bili 2.4  Imaging studies:  MRI toes on the right side is pending  Medical tests:  No   Scheduled Meds: . amLODipine  5 mg Oral Daily  . enoxaparin (LOVENOX) injection  40 mg Subcutaneous Q24H  . folic acid  1 mg Oral Daily  . gabapentin  300 mg Oral QHS  . Influenza vac split quadrivalent PF  0.5 mL Intramuscular Tomorrow-1000  . LORazepam  0-4 mg Oral Q6H   Followed by  . [START ON 04/22/2018] LORazepam  0-4 mg Oral Q12H  . losartan  100 mg Oral Daily  . multivitamin with minerals  1 tablet Oral Daily  . pantoprazole  40 mg Oral Daily  . thiamine  100 mg Oral Daily   Or  . thiamine  100 mg Intravenous Daily   Continuous Infusions:  Principal Problem:   Hyponatremia Active Problems:   Chronic hepatitis C without hepatic coma (HCC)   Elevated liver enzymes   Alcohol abuse  LOS: 1 day          

## 2018-04-21 NOTE — Progress Notes (Signed)
Pt became very agitated and began yelling at the sitter. The sitter and I attempted to reorient the pt. PRN ativan was given. Spoke with MD and was told to notify if CIWA remained above 10.

## 2018-04-21 NOTE — Progress Notes (Signed)
Pt is insisting on getting out of bed into the bathroom. Pt is educated on the importance of safety to prevent falls. Pt still wants to use bathroom instead of urinal. Pt also refuses help standing up, and shouts saying "I can do it". Sitter is at bedside and will continue to monitor.

## 2018-04-21 NOTE — Progress Notes (Addendum)
Spoke with Dr. [name] regarding code status.Dr. [name] clarified and will continue with a full code order.

## 2018-04-21 NOTE — Progress Notes (Signed)
Discussed with MD that the pt was able to transport without cardiac monitoring and without the nurse present. Verbal order was given and placed for a schedule MRI later today.

## 2018-04-21 NOTE — Progress Notes (Addendum)
Initial Nutrition Assessment  DOCUMENTATION CODES:   Not applicable  INTERVENTION:  - Will order Ensure Enlive once/day, each supplement provides 350 kcal and 20 grams of protein. - Continue to encourage PO intakes.    NUTRITION DIAGNOSIS:   Increased nutrient needs related to acute illness as evidenced by estimated needs.  GOAL:   Patient will meet greater than or equal to 90% of their needs  MONITOR:   PO intake, Supplement acceptance, Weight trends, Labs  REASON FOR ASSESSMENT:   Consult Assessment of nutrition requirement/status  ASSESSMENT:   63 y.o. male with medical history significant of depression, alcohol abuse, hypertension, reflux, hepatitis C presenting with desire to detox.   BMI indicates overweight status. Per flow sheet review, patient consumed 70% of dinner last night (800 kcal, 23 grams of protein). Patient sleeping soundly at the time of visit with sitter at bedside; no family/visitors present.   Sitter reports that patient has been sleeping or very drowsy most of the morning. He was able to take AM medications without issue despite being drowsy. She states that patient slept through lab draw this AM and requested that breakfast not be ordered as he was too tired.   Patient did not arouse to name call + shoulder rub x4. He did not awake during NFPE.   Per chart review, current weight is 186 lb and weight on 12/13/17 was 192 lb. This indicates 6 lb weight loss (3% body weight) in the past 3 months; not significant for time frame.   Patient went to MRI yesterday afternoon for MRI of toes but was unable to stay still despite multiple attempts. Radiology tech note indicates that MRI unsuccessful and patient not a candidate for imaging until he is able to stay still.    Medications reviewed; 1 mg folvite/day, daily multivitamin with minerals, 40 mg oral protonix/day, 100 mg oral thiamine/day.  Labs reviewed; Na: 125 mmol/l, Cl: 90 mmol/l, Ca: 8.5 mg/dl, LFTs  elevated.       NUTRITION - FOCUSED PHYSICAL EXAM:    Most Recent Value  Orbital Region  Unable to assess  Upper Arm Region  No depletion  Thoracic and Lumbar Region  Unable to assess  Buccal Region  No depletion  Temple Region  No depletion  Clavicle Bone Region  Mild depletion  Clavicle and Acromion Bone Region  No depletion  Scapular Bone Region  Unable to assess  Dorsal Hand  No depletion  Patellar Region  No depletion  Anterior Thigh Region  Unable to assess  Posterior Calf Region  No depletion  Edema (RD Assessment)  Severe [BLE]  Hair  Reviewed  Eyes  Unable to assess  Mouth  Unable to assess  Skin  Reviewed  Nails  Reviewed       Diet Order:   Diet Order            Diet regular Room service appropriate? Yes; Fluid consistency: Thin  Diet effective now              EDUCATION NEEDS:   Not appropriate for education at this time  Skin:  Skin Assessment: Skin Integrity Issues: Skin Integrity Issues:: Stage I, Unstageable Stage I: R toe Unstageable: R and L tibia, R pre-tibia, R toe  Last BM:  2/11  Height:   Ht Readings from Last 1 Encounters:  04/20/18 6' (1.829 m)    Weight:   Wt Readings from Last 1 Encounters:  04/21/18 84.2 kg    Ideal Body Weight:  80.91 kg  BMI:  Body mass index is 25.19 kg/m.  Estimated Nutritional Needs:   Kcal:  2000-2200 kcal  Protein:  100-110 grams  Fluid:  >/= 2 L/day     Jarome Matin, MS, RD, LDN, [facility] Inpatient Clinical Dietitian Pager # [phone] After hours/weekend pager # [phone]

## 2018-04-21 NOTE — Progress Notes (Signed)
Spoke with Dr. [name] in regards to pt becoming newly agitated and CIWA score of 15. Verbal order was given for 2mg  of IV ativan now, and 25mg  librium from later this evening once pt has settled down. Will continue to monitor and will report to night RN.

## 2018-04-22 DIAGNOSIS — R45851 Suicidal ideations: Secondary | ICD-10-CM

## 2018-04-22 LAB — CBC WITH DIFFERENTIAL/PLATELET
Abs Immature Granulocytes: 0.03 10*3/uL (ref 0.00–0.07)
BASOS PCT: 1 %
Basophils Absolute: 0.1 10*3/uL (ref 0.0–0.1)
Eosinophils Absolute: 0.2 10*3/uL (ref 0.0–0.5)
Eosinophils Relative: 3 %
HCT: 26.4 % — ABNORMAL LOW (ref 39.0–52.0)
Hemoglobin: 8.7 g/dL — ABNORMAL LOW (ref 13.0–17.0)
Immature Granulocytes: 0 %
Lymphocytes Relative: 22 %
Lymphs Abs: 1.5 10*3/uL (ref 0.7–4.0)
MCH: 34.3 pg — ABNORMAL HIGH (ref 26.0–34.0)
MCHC: 33 g/dL (ref 30.0–36.0)
MCV: 103.9 fL — ABNORMAL HIGH (ref 80.0–100.0)
Monocytes Absolute: 1 10*3/uL (ref 0.1–1.0)
Monocytes Relative: 14 %
Neutro Abs: 4.1 10*3/uL (ref 1.7–7.7)
Neutrophils Relative %: 60 %
Platelets: 154 10*3/uL (ref 150–400)
RBC: 2.54 MIL/uL — ABNORMAL LOW (ref 4.22–5.81)
RDW: 15.6 % — ABNORMAL HIGH (ref 11.5–15.5)
WBC: 6.8 10*3/uL (ref 4.0–10.5)
nRBC: 0 % (ref 0.0–0.2)

## 2018-04-22 LAB — BASIC METABOLIC PANEL
Anion gap: 8 (ref 5–15)
BUN: 13 mg/dL (ref 8–23)
CO2: 26 mmol/L (ref 22–32)
Calcium: 8.3 mg/dL — ABNORMAL LOW (ref 8.9–10.3)
Chloride: 93 mmol/L — ABNORMAL LOW (ref 98–111)
Creatinine, Ser: 0.8 mg/dL (ref 0.61–1.24)
GFR calc Af Amer: >60 mL/min (ref >60)
GFR calc non Af Amer: >60 mL/min (ref >60)
GLUCOSE: 105 mg/dL — AB (ref 70–99)
Potassium: 3.6 mmol/L (ref 3.5–5.1)
Sodium: 127 mmol/L — ABNORMAL LOW (ref 135–145)

## 2018-04-22 LAB — HEPATITIS C VRS RNA DETECT BY PCR-QUAL: Hepatitis C Vrs RNA by PCR-Qual: POSITIVE — AB

## 2018-04-22 LAB — HEPATITIS PANEL, ACUTE
HCV Ab: >11 s/co ratio — ABNORMAL HIGH (ref 0.0–0.9)
Hep A IgM: NEGATIVE
Hep B C IgM: NEGATIVE
Hepatitis B Surface Ag: NEGATIVE

## 2018-04-22 MED ORDER — LORAZEPAM 1 MG PO TABS: 1.0000 mg | ORAL_TABLET | ORAL | Status: DC | PRN

## 2018-04-22 MED ORDER — VANCOMYCIN HCL 10 G IV SOLR
1250.0000 mg | Freq: Two times a day (BID) | INTRAVENOUS | Status: DC
  Administered 2018-04-22 – 2018-04-24 (×5): 1250 mg via INTRAVENOUS
  Administered 2018-04-25: 11:00:00 via INTRAVENOUS
  Administered 2018-04-25: 1250 mg via INTRAVENOUS
  Filled 2018-04-22 (×9): qty 1250

## 2018-04-22 MED ORDER — SODIUM CHLORIDE 0.9 % IV SOLN
INTRAVENOUS | Status: DC | PRN
  Administered 2018-04-22: 500 mL via INTRAVENOUS

## 2018-04-22 MED ORDER — VANCOMYCIN HCL 10 G IV SOLR
1750.0000 mg | Freq: Once | INTRAVENOUS | Status: AC
  Administered 2018-04-22: 1750 mg via INTRAVENOUS
  Filled 2018-04-22: qty 1750

## 2018-04-22 MED ORDER — LORAZEPAM 2 MG/ML IJ SOLN
1.0000 mg | INTRAMUSCULAR | Status: DC | PRN
  Administered 2018-04-22 – 2018-04-23 (×2): 1 mg via INTRAVENOUS
  Filled 2018-04-22 (×3): qty 1

## 2018-04-22 MED ORDER — LORAZEPAM 1 MG PO TABS: 0.0000 mg | ORAL_TABLET | ORAL | Status: DC | PRN

## 2018-04-22 MED ORDER — LORAZEPAM 2 MG/ML IJ SOLN: 2.0000 mg | INTRAMUSCULAR | Status: DC | PRN

## 2018-04-22 MED ORDER — SODIUM CHLORIDE 0.9 % IV SOLN
2.0000 g | Freq: Three times a day (TID) | INTRAVENOUS | Status: DC
  Administered 2018-04-22 – 2018-04-26 (×11): 2 g via INTRAVENOUS
  Filled 2018-04-22 (×15): qty 2

## 2018-04-22 NOTE — Progress Notes (Signed)
Pharmacy Antibiotic Note  Mark Ayala is a 64 y.o. male with a h/o chronic hep C , pulmonary HTN, bipolar, pulmonary HTN, and EtOH abuse admitted on 04/20/2018 with desire for alcohol withdrawal, hematemesis, and lower extremity wounds.  Pharmacy has been consulted for vancomycin and cefepime dosing for OM of foot/toe.  Plan: Cefepime 2 g iv q8h.   Vancomycin 1750 mg iv load followed by 1250 mg iv q 12h. AUC 490, IBW/TBW. Goal AUC 400-550.   F/U renal function and culture results, levels as indicated  Height: 6' (182.9 cm) Weight: 180 lb 5.4 oz (81.8 kg) IBW/kg (Calculated) : 77.6  Temp (24hrs), Avg:98.2 F (36.8 C), Min:97.9 F (36.6 C), Max:98.7 F (37.1 C)  Recent Labs  Lab 04/20/18 0554  04/20/18 1304 04/20/18 1736 04/20/18 2037 04/21/18 0303 04/21/18 0912 04/22/18 0251  WBC 6.9  --   --  7.1  --  6.6  --  6.8  CREATININE 0.77   < > 0.85 0.86 0.79 0.85 0.85  --    < > = values in this interval not displayed.    Estimated Creatinine Clearance: 98.9 mL/min (by C-G formula based on SCr of 0.85 mg/dL).    Allergies  Allergen Reactions  . Codeine Other (See Comments)    Caused Hyperactivity    Antimicrobials this admission: 2/14 cefepime >>  2/14 vancomycin >>   Dose adjustments this admission:   Microbiology results: 2/12 MRSA PCR: negative  Thank you for allowing pharmacy to be a part of this patient's care.  Mark Ayala D 04/22/2018 8:21 AM
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
## 2018-04-22 NOTE — Progress Notes (Signed)
[facility] PROGRESS NOTE  Mark Ayala VPX:[id number] DOB: 1955/05/29 DOA: 04/20/2018 PCP: [name], MD   Narrative: 63 year old Caucasian male Known chronic hep C (possibly treated in the past), HTN, bipolar, pulmonary hypertension EF 65-70% 10/2017 Significant EtOH abuse drinks about 12 cans of beer Last hospitalized 10/14/17-10/16/17 AKI as well as ethanolism >5 emergency room visits since September last year mostly for falls and orthopedic related issues related to these falls in the setting of intoxication  Presented to emergency room with desire for withdrawal from alcohol Also noted to have apparent vomiting of blood no dark stool  Emergency room work-up = sodium 122 chloride 88 BUN/creatinine normal hemoglobin 9.0 MCV 102 no white count  A & Plan Hypervolemic hyponatremia  Lasix given x1 Resolving slwly Labs in am Ethanolism Continue CIWA protocol q4 with 0.5-1 mg Continue gabapentin to help with withdrawals He has been as high as CIWA 15 so would keep on SDU 1 more day Osteomyelitis 2nd R toe D/w Dr. Erlinda Hong ortho-will need eventual surgery [facility]--posted for 2/17.  Will transfer to [facility] on 2/16 Cont vancomycin and cefepime started 2/14 Treated hep C HCV Ab this admit >11 denoting immunity--need Op VL Pulmonary hypertension Echo performed this admission shows EF 46% likely diastolic dysfunction ContinueCozaar 100 daily, labetalol as needed blood pressures above 190, amlodipine 5 daily HTN See above Bipolar previously on Celexa 20 mg would continue when more awake Lower extremity wounds Sacral pressure injury and tibia posterior right unstageable tissue loss--- not examined today    DVT Lovenox code Status: Presumed full CODE STATUS communication: None disposition Plan: [name], MD  [facility] Via [employer] app OR [email or web address] 7PM-7AM contact night coverage as above 04/22/2018, 2:20 PM  LOS: 2 days   Consultants:  None at this  time  Procedures:  No  Antimicrobials:  No  Interval history/Subjective:  Still sleepy Agitated overngiht and needed Librium in addition to [name] minimally this am Nursing reports has been asleep most of the am  Objective:  Vitals:  Vitals:   04/22/18 0800 04/22/18 1200  BP: (!) 157/69 (!) 158/70  Pulse: (!) 103 94  Resp: (!) 23 19  Temp:    SpO2: 99% 97%    Exam:  EOMI NCAT moderate dentition S1-S2 slightly tachycardic at rest abdomen soft Arouses coherent can tell me at [facility] Not agitated--in fact appreciative of cares s1 s 2nom cta b Wounds exmined yesterday--slough and oozing to R 2nd toe dp + both sides Does however fall back to sleep  I have personally reviewed the following:  DATA   Labs:  Sodium up from 1 22-1 25--->127 chloride up from 88-90  Wbc 6.8  Hemoglobin down 9.0-->8.7  Imaging studies:  MRI toes on the right side is pending  Medical tests:  No   Scheduled Meds: . amLODipine  5 mg Oral Daily  . enoxaparin (LOVENOX) injection  40 mg Subcutaneous Q24H  . feeding supplement (ENSURE ENLIVE)  237 mL Oral Q24H  . folic acid  1 mg Oral Daily  . gabapentin  300 mg Oral QHS  . Influenza vac split quadrivalent PF  0.5 mL Intramuscular Tomorrow-1000  . losartan  100 mg Oral Daily  . mouth rinse  15 mL Mouth Rinse BID  . multivitamin with minerals  1 tablet Oral Daily  . pantoprazole  40 mg Oral Daily  . thiamine  100 mg Oral Daily   Or  . thiamine  100 mg Intravenous Daily   Continuous  Infusions: . sodium chloride Stopped (04/22/18 0847)  . ceFEPime (MAXIPIME) IV Stopped (04/22/18 1142)  . vancomycin      Principal Problem:   Hyponatremia Active Problems:   Chronic hepatitis C without hepatic coma (HCC)   Elevated liver enzymes   Alcohol abuse   LOS: 2 days

## 2018-04-22 NOTE — Progress Notes (Addendum)
Spoke to Dr. [name] about scheduling for ativan not matching up with CIWA times. He said that he wants CIWA and Ativan Q4. I was given verbal orders to change the timing. I was also given a verbal order that it is okay to hold PO meds until patient is more alert.

## 2018-04-22 NOTE — Progress Notes (Signed)
I will see patient for full consult sat morning.  Azucena Cecil, MD [facility] [phone] 5:40 PM

## 2018-04-23 DIAGNOSIS — S91104A Unspecified open wound of right lesser toe(s) without damage to nail, initial encounter: Secondary | ICD-10-CM

## 2018-04-23 DIAGNOSIS — M869 Osteomyelitis, unspecified: Secondary | ICD-10-CM | POA: Insufficient documentation

## 2018-04-23 LAB — CBC WITH DIFFERENTIAL/PLATELET
ABS IMMATURE GRANULOCYTES: 0.03 10*3/uL (ref 0.00–0.07)
BASOS PCT: 1 %
Basophils Absolute: 0.1 10*3/uL (ref 0.0–0.1)
Eosinophils Absolute: 0.4 10*3/uL (ref 0.0–0.5)
Eosinophils Relative: 5 %
HCT: 27.2 % — ABNORMAL LOW (ref 39.0–52.0)
Hemoglobin: 8.7 g/dL — ABNORMAL LOW (ref 13.0–17.0)
Immature Granulocytes: 0 %
Lymphocytes Relative: 19 %
Lymphs Abs: 1.4 10*3/uL (ref 0.7–4.0)
MCH: 35.1 pg — ABNORMAL HIGH (ref 26.0–34.0)
MCHC: 32 g/dL (ref 30.0–36.0)
MCV: 109.7 fL — ABNORMAL HIGH (ref 80.0–100.0)
Monocytes Absolute: 1 10*3/uL (ref 0.1–1.0)
Monocytes Relative: 13 %
Neutro Abs: 4.7 10*3/uL (ref 1.7–7.7)
Neutrophils Relative %: 62 %
Platelets: 155 10*3/uL (ref 150–400)
RBC: 2.48 MIL/uL — ABNORMAL LOW (ref 4.22–5.81)
RDW: 15.9 % — ABNORMAL HIGH (ref 11.5–15.5)
WBC: 7.6 10*3/uL (ref 4.0–10.5)
nRBC: 0 % (ref 0.0–0.2)

## 2018-04-23 LAB — RENAL FUNCTION PANEL
ALBUMIN: 2.3 g/dL — AB (ref 3.5–5.0)
Anion gap: 7 (ref 5–15)
BUN: 14 mg/dL (ref 8–23)
CO2: 24 mmol/L (ref 22–32)
Calcium: 8.1 mg/dL — ABNORMAL LOW (ref 8.9–10.3)
Chloride: 98 mmol/L (ref 98–111)
Creatinine, Ser: 0.83 mg/dL (ref 0.61–1.24)
GFR calc Af Amer: >60 mL/min (ref >60)
GFR calc non Af Amer: >60 mL/min (ref >60)
Glucose, Bld: 96 mg/dL (ref 70–99)
Phosphorus: 3.4 mg/dL (ref 2.5–4.6)
Potassium: 4 mmol/L (ref 3.5–5.1)
Sodium: 129 mmol/L — ABNORMAL LOW (ref 135–145)

## 2018-04-23 MED ORDER — CHLORDIAZEPOXIDE HCL 25 MG PO CAPS
25.0000 mg | ORAL_CAPSULE | Freq: Three times a day (TID) | ORAL | Status: AC
  Administered 2018-04-24 – 2018-04-25 (×3): 25 mg via ORAL
  Filled 2018-04-23 (×3): qty 1

## 2018-04-23 MED ORDER — CHLORDIAZEPOXIDE HCL 25 MG PO CAPS
25.0000 mg | ORAL_CAPSULE | Freq: Every day | ORAL | Status: AC
  Administered 2018-04-26: 25 mg via ORAL
  Filled 2018-04-23: qty 1

## 2018-04-23 MED ORDER — LOPERAMIDE HCL 2 MG PO CAPS: 2.0000 mg | ORAL_CAPSULE | ORAL | Status: DC | PRN

## 2018-04-23 MED ORDER — CHLORDIAZEPOXIDE HCL 25 MG PO CAPS
25.0000 mg | ORAL_CAPSULE | Freq: Four times a day (QID) | ORAL | Status: AC
  Administered 2018-04-23 – 2018-04-24 (×5): 25 mg via ORAL
  Filled 2018-04-23 (×5): qty 1

## 2018-04-23 MED ORDER — VITAMIN B-1 100 MG PO TABS
100.0000 mg | ORAL_TABLET | Freq: Every day | ORAL | Status: DC
  Administered 2018-04-25: 100 mg via ORAL
  Filled 2018-04-23: qty 1

## 2018-04-23 MED ORDER — CHLORDIAZEPOXIDE HCL 25 MG PO CAPS
25.0000 mg | ORAL_CAPSULE | ORAL | Status: AC
  Administered 2018-04-25 – 2018-04-26 (×2): 25 mg via ORAL
  Filled 2018-04-23 (×2): qty 1

## 2018-04-23 MED ORDER — ONDANSETRON 4 MG PO TBDP: 4.0000 mg | ORAL_TABLET | Freq: Four times a day (QID) | ORAL | Status: AC | PRN

## 2018-04-23 MED ORDER — CHLORDIAZEPOXIDE HCL 25 MG PO CAPS
25.0000 mg | ORAL_CAPSULE | Freq: Four times a day (QID) | ORAL | Status: DC | PRN
  Administered 2018-04-24: 25 mg via ORAL
  Filled 2018-04-23: qty 1

## 2018-04-23 MED ORDER — THIAMINE HCL 100 MG/ML IJ SOLN
100.0000 mg | Freq: Once | INTRAMUSCULAR | Status: AC
  Administered 2018-04-24: 100 mg via INTRAMUSCULAR
  Filled 2018-04-23: qty 2

## 2018-04-23 MED ORDER — HYDROXYZINE HCL 25 MG PO TABS
25.0000 mg | ORAL_TABLET | Freq: Four times a day (QID) | ORAL | Status: DC | PRN
  Administered 2018-04-24: 25 mg via ORAL
  Filled 2018-04-23 (×2): qty 1

## 2018-04-23 MED ORDER — ADULT MULTIVITAMIN W/MINERALS CH
1.0000 | ORAL_TABLET | Freq: Every day | ORAL | Status: DC
  Administered 2018-04-24 – 2018-05-18 (×25): 1 via ORAL
  Filled 2018-04-23 (×25): qty 1

## 2018-04-23 NOTE — Progress Notes (Signed)
[facility] PROGRESS NOTE  Mark Ayala UXL:[id number] DOB: 1955/06/26 DOA: 04/20/2018 PCP: [name], MD   Narrative: 63 year old Caucasian male Known chronic hep C (possibly treated in the past), HTN, bipolar, pulmonary hypertension EF 65-70% 10/2017 Significant EtOH abuse drinks about 12 cans of beer Last hospitalized 10/14/17-10/16/17 AKI as well as ethanolism >5 emergency room visits since September last year mostly for falls and orthopedic related issues related to these falls in the setting of intoxication  Presented to emergency room with desire for withdrawal from alcohol Also noted to have apparent vomiting of blood no dark stool  Emergency room work-up = sodium 122 chloride 88 BUN/creatinine normal hemoglobin 9.0 MCV 102 no white count  A & Plan Hypervolemic hyponatremia Labs in am Resolving with initiation of IV fluid and was given Lasix earlier in admission Ethanolism switch ativan to librium order set which has a taper--he is persistently somnolent and awakens but falls back to sleep so would prefer to cut back dose incrementally and orient/stimulate patient to perform more ADLS Continue gabapentin to help with withdrawals Ranges are 6-16 he is still confused so needs stepdown care Osteomyelitis 2nd R toe Appreciate orthopedics input Would transfer to [facility] over the course of next 24 hours hopefully mental status will improve Cont vancomycin and cefepime started 2/14 Treated hep C HCV Ab this admit >11 denoting immunity--need Op VL Pulmonary hypertension Echo performed this admission shows EF 53% likely diastolic dysfunction Continue Cozaar 100 daily, labetalol as needed blood pressures above 190, amlodipine 5 daily HTN See above Bipolar previously on Celexa 20 mg would continue when more awake Lower extremity wounds Sacral pressure injury and tibia posterior right unstageable tissue loss--- not examined today    DVT Lovenox code Status:  Presumed full CODE STATUS communication: None disposition Plan: [name], MD  [facility] Via [employer] app OR [email or web address] 7PM-7AM contact night coverage as above 04/23/2018, 10:30 AM  LOS: 3 days   Consultants:  None at this time  Procedures:  No  Antimicrobials:  No  Interval history/Subjective:  Awakens and orients to some degree but overall sleepy Do not think he has been OOB No cp nor fever Eating ~ 20% of meals    Objective:  Vitals:  Vitals:   04/23/18 0700 04/23/18 0800  BP: (!) 163/80 (!) 188/84  Pulse: 97 99  Resp: (!) 22 (!) 24  Temp:  97.6 F (36.4 C)  SpO2: 93% 97%    Exam:  Sleepy but rousable No ict no pallor looks about stated age cta b no adde dsound LE swelling bilaterally Wounds not examined given diffciulty in having patient mobilize to examine them neuro intact but poor effort  I have personally reviewed the following:  DATA   Labs:  Sodium up from 122-129  Imaging studies:  MRI toes  IMPRESSION: Motion degraded examination demonstrates marrow edema in the distal phalanx of the second toe most consistent with osteomyelitis. Negative for abscess, myositis or septic joint.  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  Hallux valgus.  Medical tests:  No  Scheduled Meds: . amLODipine  5 mg Oral Daily  . enoxaparin (LOVENOX) injection  40 mg Subcutaneous Q24H  . feeding supplement (ENSURE ENLIVE)  237 mL Oral Q24H  . folic acid  1 mg Oral Daily  . gabapentin  300 mg Oral QHS  . Influenza vac split quadrivalent PF  0.5 mL Intramuscular Tomorrow-1000  . losartan  100 mg Oral Daily  .  mouth rinse  15 mL Mouth Rinse BID  . multivitamin with minerals  1 tablet Oral Daily  . pantoprazole  40 mg Oral Daily  . thiamine  100 mg Oral Daily   Or  . thiamine  100 mg Intravenous Daily   Continuous Infusions: . sodium chloride Stopped (04/22/18 0847)  . ceFEPime (MAXIPIME)  IV 2 g (04/23/18 0843)  . vancomycin Stopped (04/22/18 2259)    Principal Problem:   Suicidal ideation Active Problems:   Chronic hepatitis C without hepatic coma (HCC)   Elevated liver enzymes   Hyponatremia   Alcohol abuse   Open toe wound   LOS: 3 days

## 2018-04-23 NOTE — Consult Note (Signed)
ORTHOPAEDIC CONSULTATION  REQUESTING PHYSICIAN: Nita Sells, MD  Chief Complaint: Right 2nd toe wound, osteomyelitis  HPI: Mark Ayala is a 63 y.o. male with h/o alcoholism, HTN, bipolar, chronic Hep C presents with a wound on the medial aspect of the right 2nd toe.  He is a poor historian and is unable to give me much in terms of a history.  Orthopedics asked by hospitalist to evaluate patient for osteomyelitis found on MRI.  He is currently on CIWA protocol.  Past Medical History:  Diagnosis Date  . Arthritis   . Depression   . GERD (gastroesophageal reflux disease)   . Hepatitis    Hepititis C  . Hypertension    Past Surgical History:  Procedure Laterality Date  . COLONOSCOPY WITH PROPOFOL N/A 02/10/2016   Procedure: COLONOSCOPY WITH PROPOFOL;  Surgeon: Clarene Essex, MD;  Location: [facility];  Service: Endoscopy;  Laterality: N/A;  . ESOPHAGOGASTRODUODENOSCOPY (EGD) WITH PROPOFOL N/A 02/10/2016   Procedure: ESOPHAGOGASTRODUODENOSCOPY (EGD) WITH PROPOFOL;  Surgeon: Clarene Essex, MD;  Location: [facility];  Service: Endoscopy;  Laterality: N/A;  . WISDOM TOOTH EXTRACTION     upper right wisdom tooth   Social History   Socioeconomic History  . Marital status: Divorced    Spouse name: Not on file  . Number of children: Not on file  . Years of education: Not on file  . Highest education level: Not on file  Occupational History  . Not on file  Social Needs  . Financial resource strain: Not on file  . Food insecurity:    Worry: Not on file    Inability: Not on file  . Transportation needs:    Medical: Not on file    Non-medical: Not on file  Tobacco Use  . Smoking status: Current Every Day Smoker    Types: Cigars  . Smokeless tobacco: Never Used  Substance and Sexual Activity  . Alcohol use: Yes    Comment: Admits to drinking earlier today   . Drug use: No    Comment: used in age 86's  . Sexual activity: Yes    Partners: Female  Lifestyle  . Physical  activity:    Days per week: Not on file    Minutes per session: Not on file  . Stress: Not on file  Relationships  . Social connections:    Talks on phone: Not on file    Gets together: Not on file    Attends religious service: Not on file    Active member of club or organization: Not on file    Attends meetings of clubs or organizations: Not on file    Relationship status: Not on file  Other Topics Concern  . Not on file  Social History Narrative  . Not on file   No family history on file. - negative except otherwise stated in the family history section Allergies  Allergen Reactions  . Codeine Other (See Comments)    Caused Hyperactivity   Prior to Admission medications   Medication Sig Start Date End Date Taking? Authorizing Provider  acetaminophen (TYLENOL) 325 MG tablet Take 2 tablets (650 mg total) by mouth every 6 (six) hours as needed. 12/13/17  Yes Horton, Barbette Hair, MD  citalopram (CELEXA) 40 MG tablet Take 0.5 tablets (20 mg total) by mouth daily for 30 days. 07/09/75 06/18/85 Yes Delora Fuel, MD  D3 SUPER STRENGTH 2000 units CAPS Take 2,000 Units by mouth daily. 12/09/15  Yes [provider]  FEROSUL 325 (65  Fe) MG tablet Take 325 mg by mouth 2 (two) times daily. 04/11/18  Yes [provider]  hydrochlorothiazide (HYDRODIURIL) 25 MG tablet Take 1 tablet (25 mg total) by mouth daily. 08/04/39  Yes Delora Fuel, MD  ibuprofen (ADVIL,MOTRIN) 600 MG tablet Take 1 tablet (600 mg total) by mouth 4 (four) times daily. 32/4/40  Yes Delora Fuel, MD  losartan (COZAAR) 100 MG tablet Take 1 tablet (100 mg total) by mouth daily. 03/10/70  Yes Delora Fuel, MD  potassium chloride SA (K-DUR,KLOR-CON) 20 MEQ tablet Take 1 tablet (20 mEq total) by mouth daily. 5/36/64  Yes Delora Fuel, MD  sulfamethoxazole-trimethoprim (BACTRIM DS,SEPTRA DS) 800-160 MG tablet Take 1 tablet by mouth 2 (two) times daily.   Yes [provider]  Tetrahydrozoline HCl (EYE DROPS OP)  Apply 1 drop to eye daily as needed (red eyes).   Yes [provider]  thiamine 100 MG tablet Take 1 tablet (100 mg total) by mouth daily. 10/17/17  Yes Alma Friendly, MD  pantoprazole (PROTONIX) 40 MG tablet Take 1 tablet (40 mg total) by mouth daily. 10/17/17 11/16/17  Alma Friendly, MD   Mr Toes Right Wo Contrast  Result Date: 04/21/2018 CLINICAL DATA:  Right second toe redness, pain and swelling. Skin ulcerations. EXAM: MRI OF THE RIGHT TOES WITHOUT CONTRAST TECHNIQUE: Multiplanar, multisequence MR imaging of the right foot was performed. No intravenous contrast was administered. COMPARISON:  Plain films right foot 04/20/2018. FINDINGS: Patient motion degrades the study. Bones/Joint/Cartilage There is marrow edema in the distal phalanx of the second toe worrisome for osteomyelitis. Hallux valgus is noted. The patient has some osteoarthritic change about the first and second MTP joints. Subchondral edema in the head of the second metatarsal is noted. Ligaments Appear intact. Muscles and Tendons There is some atrophy of intrinsic musculature of the foot. No intramuscular fluid collection. Soft tissues Subcutaneous edema is seen over the dorsum of the foot. No joint effusion or abscess. IMPRESSION: Motion degraded examination demonstrates marrow edema in the distal phalanx of the second toe most consistent with osteomyelitis. Negative for abscess, myositis or septic joint. 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 Hallux valgus. Electronically Signed   By: Inge Rise M.D.   On: 04/21/2018 16:00   - pertinent xrays, CT, MRI studies were reviewed and independently interpreted  Positive ROS: All other systems have been reviewed and were otherwise negative with the exception of those mentioned in the HPI and as above.  Physical Exam: General: No acute distress Cardiovascular: No pedal edema Respiratory: No cyanosis, no use of accessory  musculature GI: No organomegaly, abdomen is soft and non-tender Skin: No lesions in the area of chief complaint Neurologic: Sensation intact distally Psychiatric: Patient is oriented to place Lymphatic: No axillary or cervical lymphadenopathy  MUSCULOSKELETAL:  Mildly swollen right 2nd toe with wound on medial aspect Not tender, scant drainage  Assessment: Right 2nd toe distal phalanx osteomyelitis with overlying wound  Plan: Discussed with patient that recommendation is for partial 2nd toe amputation given MRI findings.  Risks, benefits, alternatives discussed.  Patient elects to proceed with amputation.  Please transfer patient to [facility] hospital over the weekend if stable for surgery on Monday.  Thank you for the consult and the opportunity to see Mr. Mark Allcock, MD [place] [phone] 8:44 AM

## 2018-04-24 DIAGNOSIS — L8990 Pressure ulcer of unspecified site, unspecified stage: Secondary | ICD-10-CM

## 2018-04-24 LAB — RENAL FUNCTION PANEL
Albumin: 2.2 g/dL — ABNORMAL LOW (ref 3.5–5.0)
Anion gap: 8 (ref 5–15)
BUN: 16 mg/dL (ref 8–23)
CO2: 23 mmol/L (ref 22–32)
Calcium: 7.8 mg/dL — ABNORMAL LOW (ref 8.9–10.3)
Chloride: 97 mmol/L — ABNORMAL LOW (ref 98–111)
Creatinine, Ser: 0.95 mg/dL (ref 0.61–1.24)
GFR calc non Af Amer: >60 mL/min (ref >60)
Glucose, Bld: 90 mg/dL (ref 70–99)
Phosphorus: 2.9 mg/dL (ref 2.5–4.6)
Potassium: 4.1 mmol/L (ref 3.5–5.1)
Sodium: 128 mmol/L — ABNORMAL LOW (ref 135–145)

## 2018-04-24 LAB — AMMONIA: AMMONIA: 26 umol/L (ref 9–35)

## 2018-04-24 NOTE — Progress Notes (Signed)
Spoke to Daughter on phone She tells me he is previously homeless She is only NOK-he was living with her previsouly until 6 months prior He went to the [place] house in [place] he decided allegedly to go out and drink-she is unwilling to take him back in to her home--he has nowhere to go subsequent to leaving hospital She hasn't spoke to her dad in 2-3 weeks Her number [phone] and she can be reached at this number for consent  Verneita Griffes, MD [facility] 10:55 AM

## 2018-04-24 NOTE — Progress Notes (Signed)
     Subjective:   Procedure(s) (LRB): PARTIAL  AMPUTATION OF RIGHT 2nd TOE (Right)  Patient reports pain as moderate.    Objective:   VITALS:  Temp:  [97.4 F (36.3 C)-100.3 F (37.9 C)] 97.4 F (36.3 C) (02/16 0735) Pulse Rate:  [77-114] 77 (02/16 0900) Resp:  [15-29] 15 (02/16 0900) BP: (131-170)/(60-90) 131/66 (02/16 0900) SpO2:  [94 %-100 %] 96 % (02/16 0900) Weight:  [85.4 kg] 85.4 kg (02/16 0327)  ABD soft Compartment soft Right second toe swelling, small purulent ulcer right anterolateral distal leg   LABS Recent Labs    04/22/18 0251 04/23/18 0332  HGB 8.7* 8.7*  WBC 6.8 7.6  PLT 154 155   Recent Labs    04/23/18 0332 04/24/18 0353  NA 129* 128*  K 4.0 4.1  CL 98 97*  CO2 24 23  BUN 14 16  CREATININE 0.83 0.95  GLUCOSE 96 90   No results for input(s): LABPT, INR in the last 72 hours.   Assessment/Plan:   Procedure(s) (LRB): PARTIAL  AMPUTATION OF RIGHT 2nd TOE (Right)  Plan is for transfer to [facility] for surgical treatment of the right leg by Dr. Erlinda Hong.    Basil Dess 04/24/2018, 12:42 PMPatient ID: Mark Ayala, male   DOB: August 27, 1955, 63 y.o.   MRN: [id number]

## 2018-04-24 NOTE — Progress Notes (Signed)
[facility] PROGRESS NOTE  Mark Ayala FXT:[id number] DOB: [birth year] DOA: 04/20/2018 PCP: Mark Blocker, MD   Narrative: 63 year old Caucasian male Known chronic hep C (possibly treated in the past), HTN, bipolar, pulmonary hypertension EF 65-70% 10/2017 Significant EtOH abuse drinks about 12 cans of beer Last hospitalized 10/14/17-10/16/17 AKI as well as ethanolism >5 emergency room visits since September last year mostly for falls and orthopedic related issues related to these falls in the setting of intoxication  Presented to emergency room with desire for withdrawal from alcohol Also noted to have apparent vomiting of blood no dark stool  Emergency room work-up = sodium 122 chloride 88 BUN/creatinine normal hemoglobin 9.0 MCV 102 no white count  A & Plan Hypervolemic hyponatremia Labs in am Resolving with initiation of IV fluid and was given Lasix earlier in admission Resume fluids midnight as will be n.p.o. for surgery Metabolic encephalopathy He has persistent confusion-Not sure if this is only from [facility] broaden search for causes with checking ammonia levels. I am discontinuing gabapentin and oxycodone at this time until he is more coherent Ethanolism switch ativan to librium order set which has a taper--he is persistently somnolent and awakens but falls back to sleep so would prefer to cut back dose incrementally and orient/stimulate patient to perform more ADLS Continue gabapentin to help with withdrawals He will require progressive care on transfer to [facility] as he is intermittently confused and his CIWA's have ranged from 9-14 Osteomyelitis 2nd R toe Appreciate orthopedics input Supposed to get partial second toe amputation probably on 2/17 Cont vancomycin and cefepime started 2/14 Antibiotic therapy to be guided by deep bone cultures at time of surgery Treated hep C HCV Ab this admit >11 denoting immunity--need Op VL Pulmonary hypertension Echo  performed this admission shows EF 29% likely diastolic dysfunction Continue Cozaar 100 daily, labetalol as needed blood pressures above 190, amlodipine 5 daily HTN See above Bipolar previously on Celexa 20 mg would resume on discharge Lower extremity wounds Sacral pressure injury and tibia posterior right unstageable tissue loss--- not examined today    DVT Lovenox code Status: Presumed full CODE STATUS communication: None-has been by I alerted his brother Mark Ayala --his daughter Mark Ayala [phone] and lives in [place]  disposition Plan: Stepdown transferring to [facility] and then further management dependent on eventual outcome it is unclear whether he was suicidal but he does warrant inpatient psychiatric hospitalization once medically he stabilizes   Mark Au, MD  [facility] Via [employer] app OR [email or web address] 7PM-7AM contact night coverage as above 04/24/2018, 10:16 AM  LOS: 4 days   Consultants:  None at this time  Procedures:  No  Antimicrobials:  No  Interval history/Subjective:  Sleepy and noncoherent to person this morning he cannot tell me where he is this seems to come and go He has not been out of bed since admission He says he has overall body pains and aches    Objective:  Vitals:  Vitals:   04/24/18 0800 04/24/18 0900  BP: (!) 151/65 131/66  Pulse: 79 77  Resp: 17 15  Temp:    SpO2: 95% 96%    Exam:  Arousable but cannot tell me where he is where is he was able to do this intermittently yesterday No pallor no icterus Chest clear Wounds not examined today neuro intact but poor effort  I have personally reviewed the following:  DATA   Labs:  Sodium up from 122-128  Imaging studies:  MRI toes  IMPRESSION:  Motion degraded examination demonstrates marrow edema in the distal phalanx of the second toe most consistent with osteomyelitis. Negative for abscess, myositis or septic joint.  Moderate bilateral that moderate first and  second MTP joint osteoarthritis.  Hallux valgus.  Medical tests:  No  Scheduled Meds: . amLODipine  5 mg Oral Daily  . chlordiazePOXIDE  25 mg Oral QID   Followed by  . chlordiazePOXIDE  25 mg Oral TID   Followed by  . [START ON 04/25/2018] chlordiazePOXIDE  25 mg Oral BH-qamhs   Followed by  . [START ON 04/26/2018] chlordiazePOXIDE  25 mg Oral Daily  . enoxaparin (LOVENOX) injection  40 mg Subcutaneous Q24H  . feeding supplement (ENSURE ENLIVE)  237 mL Oral Q24H  . gabapentin  300 mg Oral QHS  . Influenza vac split quadrivalent PF  0.5 mL Intramuscular Tomorrow-1000  . losartan  100 mg Oral Daily  . mouth rinse  15 mL Mouth Rinse BID  . multivitamin with minerals  1 tablet Oral Daily  . pantoprazole  40 mg Oral Daily  . thiamine  100 mg Oral Daily   Continuous Infusions: . sodium chloride Stopped (04/22/18 0847)  . ceFEPime (MAXIPIME) IV 2 g (04/24/18 0832)  . vancomycin 1,250 mg (04/24/18 0925)    Principal Problem:   Suicidal ideation Active Problems:   Chronic hepatitis C without hepatic coma (HCC)   Elevated liver enzymes   Hyponatremia   Alcohol abuse   Open toe wound   LOS: 4 days

## 2018-04-24 NOTE — Progress Notes (Signed)
Report called and given to mary, RN on Ronda and pt transferred to [phone]. Let unit on stretcher pushed by [facility] staff. Left in stable condition.  VWilliams,RN.

## 2018-04-25 ENCOUNTER — Encounter (HOSPITAL_COMMUNITY): Payer: Self-pay | Admitting: Anesthesiology

## 2018-04-25 ENCOUNTER — Encounter (HOSPITAL_COMMUNITY)
Admission: EM | Disposition: A | Discharge status: Skilled Nursing Facility | Payer: Self-pay | Source: Home / Self Care | Attending: Internal Medicine

## 2018-04-25 ENCOUNTER — Inpatient Hospital Stay (HOSPITAL_COMMUNITY): Payer: Medicaid Other | Admitting: Anesthesiology

## 2018-04-25 DIAGNOSIS — M86171 Other acute osteomyelitis, right ankle and foot: Secondary | ICD-10-CM

## 2018-04-25 DIAGNOSIS — B182 Chronic viral hepatitis C: Secondary | ICD-10-CM

## 2018-04-25 DIAGNOSIS — M869 Osteomyelitis, unspecified: Principal | ICD-10-CM

## 2018-04-25 DIAGNOSIS — S81801A Unspecified open wound, right lower leg, initial encounter: Secondary | ICD-10-CM

## 2018-04-25 DIAGNOSIS — F1010 Alcohol abuse, uncomplicated: Secondary | ICD-10-CM

## 2018-04-25 HISTORY — PX: MINOR AMPUTATION OF DIGIT: SHX6242

## 2018-04-25 LAB — CBC WITH DIFFERENTIAL/PLATELET
Abs Immature Granulocytes: 0.04 10*3/uL (ref 0.00–0.07)
Basophils Absolute: 0.1 10*3/uL (ref 0.0–0.1)
Basophils Relative: 1 %
Eosinophils Absolute: 0.4 10*3/uL (ref 0.0–0.5)
Eosinophils Relative: 6 %
HCT: 26.3 % — ABNORMAL LOW (ref 39.0–52.0)
Hemoglobin: 8.6 g/dL — ABNORMAL LOW (ref 13.0–17.0)
Immature Granulocytes: 1 %
LYMPHS ABS: 1.4 10*3/uL (ref 0.7–4.0)
Lymphocytes Relative: 20 %
MCH: 34.5 pg — ABNORMAL HIGH (ref 26.0–34.0)
MCHC: 32.7 g/dL (ref 30.0–36.0)
MCV: 105.6 fL — AB (ref 80.0–100.0)
MONOS PCT: 17 %
Monocytes Absolute: 1.2 10*3/uL — ABNORMAL HIGH (ref 0.1–1.0)
Neutro Abs: 3.8 10*3/uL (ref 1.7–7.7)
Neutrophils Relative %: 55 %
Platelets: 156 10*3/uL (ref 150–400)
RBC: 2.49 MIL/uL — ABNORMAL LOW (ref 4.22–5.81)
RDW: 15.7 % — ABNORMAL HIGH (ref 11.5–15.5)
WBC: 6.9 10*3/uL (ref 4.0–10.5)
nRBC: 0 % (ref 0.0–0.2)

## 2018-04-25 LAB — BASIC METABOLIC PANEL
Anion gap: 7 (ref 5–15)
BUN: 13 mg/dL (ref 8–23)
CHLORIDE: 104 mmol/L (ref 98–111)
CO2: 22 mmol/L (ref 22–32)
Calcium: 8.2 mg/dL — ABNORMAL LOW (ref 8.9–10.3)
Creatinine, Ser: 0.91 mg/dL (ref 0.61–1.24)
GFR calc Af Amer: >60 mL/min (ref >60)
GFR calc non Af Amer: >60 mL/min (ref >60)
Glucose, Bld: 97 mg/dL (ref 70–99)
Potassium: 4.5 mmol/L (ref 3.5–5.1)
Sodium: 133 mmol/L — ABNORMAL LOW (ref 135–145)

## 2018-04-25 LAB — SURGICAL PCR SCREEN
MRSA, PCR: NEGATIVE
Staphylococcus aureus: NEGATIVE

## 2018-04-25 SURGERY — MINOR AMPUTATION OF DIGIT
Anesthesia: Monitor Anesthesia Care | Laterality: Right

## 2018-04-25 MED ORDER — PROPOFOL 10 MG/ML IV BOLUS
INTRAVENOUS | Status: AC
  Filled 2018-04-25: qty 20

## 2018-04-25 MED ORDER — PROPOFOL 500 MG/50ML IV EMUL
INTRAVENOUS | Status: AC
  Filled 2018-04-25: qty 50

## 2018-04-25 MED ORDER — LACTATED RINGERS IV SOLN
INTRAVENOUS | Status: DC | PRN
  Administered 2018-04-25: 11:00:00 via INTRAVENOUS

## 2018-04-25 MED ORDER — FOLIC ACID 1 MG PO TABS
1.0000 mg | ORAL_TABLET | Freq: Every day | ORAL | Status: DC
  Administered 2018-04-26 – 2018-05-18 (×23): 1 mg via ORAL
  Filled 2018-04-25 (×23): qty 1

## 2018-04-25 MED ORDER — 0.9 % SODIUM CHLORIDE (POUR BTL) OPTIME
TOPICAL | Status: DC | PRN
  Administered 2018-04-25: 1000 mL

## 2018-04-25 MED ORDER — MUPIROCIN CALCIUM 2 % EX CREA
TOPICAL_CREAM | CUTANEOUS | Status: AC
  Filled 2018-04-25: qty 15

## 2018-04-25 MED ORDER — ONDANSETRON HCL 4 MG/2ML IJ SOLN
INTRAMUSCULAR | Status: AC
  Filled 2018-04-25: qty 4

## 2018-04-25 MED ORDER — FENTANYL CITRATE (PF) 100 MCG/2ML IJ SOLN
25.0000 ug | INTRAMUSCULAR | Status: DC | PRN
  Administered 2018-04-25: 25 ug via INTRAVENOUS

## 2018-04-25 MED ORDER — PROMETHAZINE HCL 25 MG/ML IJ SOLN: 6.2500 mg | INTRAMUSCULAR | Status: DC | PRN

## 2018-04-25 MED ORDER — DEXAMETHASONE SODIUM PHOSPHATE 10 MG/ML IJ SOLN
INTRAMUSCULAR | Status: AC
  Filled 2018-04-25: qty 1

## 2018-04-25 MED ORDER — OXYCODONE HCL 5 MG PO TABS: 5.0000 mg | ORAL_TABLET | Freq: Once | ORAL | Status: DC | PRN

## 2018-04-25 MED ORDER — MIDAZOLAM HCL 2 MG/2ML IJ SOLN
INTRAMUSCULAR | Status: AC
  Filled 2018-04-25: qty 2

## 2018-04-25 MED ORDER — SODIUM CHLORIDE 0.9 % IR SOLN
Status: DC | PRN
  Administered 2018-04-25 (×2): 3000 mL

## 2018-04-25 MED ORDER — BUPIVACAINE HCL (PF) 0.25 % IJ SOLN
INTRAMUSCULAR | Status: AC
  Filled 2018-04-25: qty 30

## 2018-04-25 MED ORDER — VITAMIN B-1 100 MG PO TABS
500.0000 mg | ORAL_TABLET | Freq: Every day | ORAL | Status: AC
  Administered 2018-04-26 – 2018-04-30 (×5): 500 mg via ORAL
  Filled 2018-04-25 (×5): qty 5

## 2018-04-25 MED ORDER — FENTANYL CITRATE (PF) 100 MCG/2ML IJ SOLN
INTRAMUSCULAR | Status: AC
  Filled 2018-04-25: qty 2

## 2018-04-25 MED ORDER — BUPIVACAINE HCL (PF) 0.25 % IJ SOLN
INTRAMUSCULAR | Status: DC | PRN
  Administered 2018-04-25: 30 mL

## 2018-04-25 MED ORDER — FENTANYL CITRATE (PF) 100 MCG/2ML IJ SOLN
INTRAMUSCULAR | Status: DC | PRN
  Administered 2018-04-25 (×2): 25 ug via INTRAVENOUS
  Administered 2018-04-25: 50 ug via INTRAVENOUS

## 2018-04-25 MED ORDER — MUPIROCIN 2 % EX OINT
TOPICAL_OINTMENT | CUTANEOUS | Status: DC | PRN
  Administered 2018-04-25: 1 via TOPICAL

## 2018-04-25 MED ORDER — LIDOCAINE 2% (20 MG/ML) 5 ML SYRINGE
INTRAMUSCULAR | Status: AC
  Filled 2018-04-25: qty 5

## 2018-04-25 MED ORDER — ACETAMINOPHEN 10 MG/ML IV SOLN: 1000.0000 mg | Freq: Once | INTRAVENOUS | Status: DC | PRN

## 2018-04-25 MED ORDER — LIDOCAINE 2% (20 MG/ML) 5 ML SYRINGE
INTRAMUSCULAR | Status: DC | PRN
  Administered 2018-04-25: 40 mg via INTRAVENOUS

## 2018-04-25 MED ORDER — MUPIROCIN 2 % EX OINT
TOPICAL_OINTMENT | CUTANEOUS | Status: AC
  Filled 2018-04-25: qty 22

## 2018-04-25 MED ORDER — OXYCODONE HCL 5 MG/5ML PO SOLN: 5.0000 mg | Freq: Once | ORAL | Status: DC | PRN

## 2018-04-25 MED ORDER — PROPOFOL 10 MG/ML IV BOLUS
INTRAVENOUS | Status: DC | PRN
  Administered 2018-04-25: 150 mg via INTRAVENOUS

## 2018-04-25 MED ORDER — ARTIFICIAL TEARS OPHTHALMIC OINT
TOPICAL_OINTMENT | OPHTHALMIC | Status: AC
  Filled 2018-04-25: qty 3.5

## 2018-04-25 MED ORDER — DEXAMETHASONE SODIUM PHOSPHATE 10 MG/ML IJ SOLN
INTRAMUSCULAR | Status: DC | PRN
  Administered 2018-04-25: 10 mg via INTRAVENOUS

## 2018-04-25 MED ORDER — FENTANYL CITRATE (PF) 250 MCG/5ML IJ SOLN
INTRAMUSCULAR | Status: AC
  Filled 2018-04-25: qty 5

## 2018-04-25 SURGICAL SUPPLY — 53 items
BANDAGE ELASTIC 3 VELCRO ST LF (GAUZE/BANDAGES/DRESSINGS) ×2 IMPLANT
BLADE AVERAGE 25MMX9MM (BLADE)
BLADE AVERAGE 25X9 (BLADE) IMPLANT
BLADE LONG MED 31MMX9MM (MISCELLANEOUS) ×1
BLADE LONG MED 31X9 (MISCELLANEOUS) ×2 IMPLANT
BNDG COHESIVE 1X5 TAN STRL LF (GAUZE/BANDAGES/DRESSINGS) ×2 IMPLANT
BNDG COHESIVE 4X5 TAN STRL (GAUZE/BANDAGES/DRESSINGS) IMPLANT
BNDG CONFORM 2 STRL LF (GAUZE/BANDAGES/DRESSINGS) ×3 IMPLANT
BNDG CONFORM 3 STRL LF (GAUZE/BANDAGES/DRESSINGS) IMPLANT
BNDG ESMARK 4X9 LF (GAUZE/BANDAGES/DRESSINGS) ×3 IMPLANT
CANISTER SUCT 3000ML PPV (MISCELLANEOUS) ×3 IMPLANT
CORDS BIPOLAR (ELECTRODE) IMPLANT
COVER SURGICAL LIGHT HANDLE (MISCELLANEOUS) ×3 IMPLANT
COVER WAND RF STERILE (DRAPES) ×3 IMPLANT
CUFF TOURNIQUET SINGLE 18IN (TOURNIQUET CUFF) IMPLANT
CUFF TOURNIQUET SINGLE 24IN (TOURNIQUET CUFF) IMPLANT
CUFF TOURNIQUET SINGLE 34IN LL (TOURNIQUET CUFF) IMPLANT
DRAPE SURG 17X23 STRL (DRAPES) IMPLANT
DRAPE U-SHAPE 47X51 STRL (DRAPES) IMPLANT
DRESSING ADAPTIC 1/2  N-ADH (PACKING) ×2 IMPLANT
DURAPREP 26ML APPLICATOR (WOUND CARE) ×3 IMPLANT
ELECT CAUTERY BLADE 6.4 (BLADE) IMPLANT
ELECT REM PT RETURN 9FT ADLT (ELECTROSURGICAL) ×3
ELECTRODE REM PT RTRN 9FT ADLT (ELECTROSURGICAL) ×1 IMPLANT
FACESHIELD WRAPAROUND (MASK) ×3 IMPLANT
FACESHIELD WRAPAROUND OR TEAM (MASK) ×1 IMPLANT
GAUZE SPONGE 4X4 12PLY STRL (GAUZE/BANDAGES/DRESSINGS) ×2 IMPLANT
GAUZE XEROFORM 1X8 LF (GAUZE/BANDAGES/DRESSINGS) IMPLANT
GLOVE BIOGEL PI IND STRL 7.0 (GLOVE) ×1 IMPLANT
GLOVE BIOGEL PI INDICATOR 7.0 (GLOVE) ×2
GLOVE ECLIPSE 7.0 STRL STRAW (GLOVE) ×3 IMPLANT
GLOVE SKINSENSE NS SZ7.5 (GLOVE) ×2
GLOVE SKINSENSE STRL SZ7.5 (GLOVE) ×1 IMPLANT
GOWN STRL REIN XL XLG (GOWN DISPOSABLE) ×6 IMPLANT
HANDPIECE INTERPULSE COAX TIP (DISPOSABLE)
KIT BASIN OR (CUSTOM PROCEDURE TRAY) ×3 IMPLANT
KIT TURNOVER KIT B (KITS) ×3 IMPLANT
NDL HYPO 25GX1X1/2 BEV (NEEDLE) IMPLANT
NEEDLE HYPO 25GX1X1/2 BEV (NEEDLE) IMPLANT
NS IRRIG 1000ML POUR BTL (IV SOLUTION) ×3 IMPLANT
PACK ORTHO EXTREMITY (CUSTOM PROCEDURE TRAY) ×3 IMPLANT
PAD ARMBOARD 7.5X6 YLW CONV (MISCELLANEOUS) ×6 IMPLANT
SET HNDPC FAN SPRY TIP SCT (DISPOSABLE) IMPLANT
SPECIMEN JAR SMALL (MISCELLANEOUS) ×3 IMPLANT
SUT ETHILON 2 0 FS 18 (SUTURE) IMPLANT
SUT ETHILON 3 0 PS 1 (SUTURE) ×2 IMPLANT
SYR CONTROL 10ML LL (SYRINGE) IMPLANT
TOWEL OR 17X24 6PK STRL BLUE (TOWEL DISPOSABLE) ×3 IMPLANT
TOWEL OR 17X26 10 PK STRL BLUE (TOWEL DISPOSABLE) ×3 IMPLANT
TUBE CONNECTING 12'X1/4 (SUCTIONS)
TUBE CONNECTING 12X1/4 (SUCTIONS) IMPLANT
TUBING CYSTO DISP (UROLOGICAL SUPPLIES) IMPLANT
WATER STERILE IRR 1000ML POUR (IV SOLUTION) ×3 IMPLANT

## 2018-04-25 NOTE — Op Note (Signed)
   Date of Surgery: 04/25/2018  INDICATIONS: Mr. Schrecengost is a 63 y.o.-year-old male with a right toe ostemyelitis and right lower leg wound;  The patient did consent to the procedure after discussion of the risks and benefits.  PREOPERATIVE DIAGNOSIS:  1. Right 2nd toe osteomyelitis 2. Right lower leg wound  POSTOPERATIVE DIAGNOSIS: Same.  PROCEDURE:  1.  Amputation of right second toe through PIP joint 2.  Adjacent tissue rearrangement right second toe 2 cm 3.  Irrigation debridement of subcutaneous tissue 2 cm  SURGEON: N. Eduard Roux, M.D.  ASSIST: Ciro Backer [address], [place]; necessary for the timely completion of procedure and due to complexity of procedure.  ANESTHESIA:  general  IV FLUIDS AND URINE: See anesthesia.  ESTIMATED BLOOD LOSS: Minimal mL.  IMPLANTS: None  DRAINS: None  COMPLICATIONS: None.  DESCRIPTION OF PROCEDURE: The patient was brought to the operating room and placed supine on the operating table.  The patient had been signed prior to the procedure and this was documented. The patient had the anesthesia placed by the anesthesiologist.  A time-out was performed to confirm that this was the correct patient, site, side and location. The patient did receive antibiotics prior to the incision and was re-dosed during the procedure as needed at indicated intervals.  The patient had the operative extremity prepped and draped in the standard surgical fashion.    We first began with the toe amputation.  A modified fishmouth incision was made on the second toe based over the PIP joint.  Full-thickness flaps were elevated off of the proximal phalanx.  The open wound was fully removed sharply.  Sharp dissection was continued down through the PIP joint and the toe was disarticulated through this joint.  The neurovascular bundles were then identified and cauterized and retracted proximally.  I then thoroughly irrigated this wound with 3 L normal saline.  There was good punctate  bleeding of the tissues.  Hemostasis was obtained.  We then turned our attention to the open wound on the right lower leg.  The fibrous tissue was sharply debrided using a 15 blade down to bleeding subcutaneous tissue.  There was no evidence of deeper penetration of the wound.  This was also thoroughly irrigated with 3 L of normal saline.  Hemostasis was obtained.  For the toe I had to perform adjacent tissue rearrangement in order to primarily close the skin edges with 3-0 nylon sutures.  Sterile dressings were applied.  For the right lower leg wound I placed wet-to-dry dressings.  Patient tolerated procedure well had no immediate complications.  POSTOPERATIVE PLAN: Patient will be heel weightbearing to the right lower extremity in a Darco shoe.  Azucena Cecil, MD [facility] [phone] 12:33 PM

## 2018-04-25 NOTE — Anesthesia Postprocedure Evaluation (Signed)
Anesthesia Post Note  Patient: Mark Ayala  Procedure(s) Performed: PARTIAL  AMPUTATION OF RIGHT 2nd TOE (Right )     Patient location during evaluation: PACU Anesthesia Type: General Level of consciousness: awake and alert Pain management: pain level controlled Vital Signs Assessment: post-procedure vital signs reviewed and stable Respiratory status: spontaneous breathing, nonlabored ventilation and respiratory function stable Cardiovascular status: blood pressure returned to baseline and stable Postop Assessment: no apparent nausea or vomiting Anesthetic complications: no    Last Vitals:  Vitals:   04/25/18 1315 04/25/18 1338  BP: (!) 172/90 (!) 154/88  Pulse: 90 85  Resp: 18   Temp: 36.6 C (!) 36.4 C  SpO2: 97% 94%    Last Pain:  Vitals:   04/25/18 1338  TempSrc: Oral  PainSc:                  Brennan Bailey

## 2018-04-25 NOTE — Discharge Instructions (Signed)
Postoperative instructions:  Weightbearing instructions: heel weight bearing  Dressing instructions: Keep your dressing and/or splint clean and dry at all times.  It will be removed at your first post-operative appointment.  Your stitches and/or staples will be removed at this visit.  Incision instructions:  Do not soak your incision for 3 weeks after surgery.  If the incision gets wet, pat dry and do not scrub the incision.  Pain control:  You have been given a prescription to be taken as directed for post-operative pain control.  In addition, elevate the operative extremity above the heart at all times to prevent swelling and throbbing pain.  Take over-the-counter Colace, 100mg  by mouth twice a day while taking narcotic pain medications to help prevent constipation.  Follow up appointments: 1) 12-14 days for suture removal and wound check. 2) Dr. Erlinda Hong as scheduled.   -------------------------------------------------------------------------------------------------------------  After Surgery Pain Control:  After your surgery, post-surgical discomfort or pain is likely. This discomfort can last several days to a few weeks. At certain times of the day your discomfort may be more intense.  Did you receive a nerve block?  A nerve block can provide pain relief for one hour to two days after your surgery. As long as the nerve block is working, you will experience little or no sensation in the area the surgeon operated on.  As the nerve block wears off, you will begin to experience pain or discomfort. It is very important that you begin taking your prescribed pain medication before the nerve block fully wears off. Treating your pain at the first sign of the block wearing off will ensure your pain is better controlled and more tolerable when full-sensation returns. Do not wait until the pain is intolerable, as the medicine will be less effective. It is better to treat pain in advance than to try and  catch up.  General Anesthesia:  If you did not receive a nerve block during your surgery, you will need to start taking your pain medication shortly after your surgery and should continue to do so as prescribed by your surgeon.  Pain Medication:  Most commonly we prescribe Vicodin and Percocet for post-operative pain. Both of these medications contain a combination of acetaminophen (Tylenol) and a narcotic to help control pain.   It takes between 30 and 45 minutes before pain medication starts to work. It is important to take your medication before your pain level gets too intense.   Nausea is a common side effect of many pain medications. You will want to eat something before taking your pain medicine to help prevent nausea.   If you are taking a prescription pain medication that contains acetaminophen, we recommend that you do not take additional over the counter acetaminophen (Tylenol).  Other pain relieving options:   Using a cold pack to ice the affected area a few times a day (15 to 20 minutes at a time) can help to relieve pain, reduce swelling and bruising.   Elevation of the affected area can also help to reduce pain and swelling.

## 2018-04-25 NOTE — Transfer of Care (Signed)
Immediate Anesthesia Transfer of Care Note  Patient: Mark Ayala  Procedure(s) Performed: PARTIAL  AMPUTATION OF RIGHT 2nd TOE (Right )  Patient Location: PACU  Anesthesia Type:General  Level of Consciousness: sedated  Airway & Oxygen Therapy: Patient Spontanous Breathing and Patient connected to nasal cannula oxygen  Post-op Assessment: Report given to RN, Post -op Vital signs reviewed and stable and Patient moving all extremities X 4  Post vital signs: Reviewed and stable  Last Vitals:  Vitals Value Taken Time  BP 150/88 04/25/2018 12:47 PM  Temp    Pulse 92 04/25/2018 12:48 PM  Resp 22 04/25/2018 12:48 PM  SpO2 95 % 04/25/2018 12:48 PM  Vitals shown include unvalidated device data.  Last Pain:  Vitals:   04/25/18 0815  TempSrc:   PainSc: Asleep         Complications: No apparent anesthesia complications

## 2018-04-25 NOTE — H&P (Signed)

## 2018-04-25 NOTE — Anesthesia Preprocedure Evaluation (Addendum)
Anesthesia Evaluation  Patient identified by MRN, date of birth, ID band Patient awake  General Assessment Comment:Oriented to person and year but states location as [facility], tangential in conversation with somewhat slowed speech  Reviewed: Allergy & Precautions, NPO status , Patient's Chart, lab work & pertinent test results  History of Anesthesia Complications Negative for: history of anesthetic complications  Airway Mallampati: II  TM Distance: >3 FB Neck ROM: Full    Dental  (+) Teeth Intact, Dental Advisory Given, Poor Dentition   Pulmonary Current Smoker,    Pulmonary exam normal breath sounds clear to auscultation       Cardiovascular hypertension, Pt. on medications Normal cardiovascular exam Rhythm:Regular Rate:Normal  TTE 04/20/18: left ventricle has hyperdynamic systolic function of >03%, impaired relaxation, right atrial size was mildly dilated    Neuro/Psych Depression negative neurological ROS     GI/Hepatic GERD  Medicated and Controlled,(+)     substance abuse (last EtOH 4 days ago per pt report)  alcohol use, Hepatitis -, C  Endo/Other  negative endocrine ROS  Renal/GU negative Renal ROS     Musculoskeletal  (+) Arthritis , Right second toe osteomyelitis   Abdominal   Peds  Hematology  (+) anemia ,   Anesthesia Other Findings Day of surgery medications reviewed with the patient.  Reproductive/Obstetrics                            Anesthesia Physical Anesthesia Plan  ASA: III  Anesthesia Plan: General   Post-op Pain Management:    Induction: Intravenous  PONV Risk Score and Plan: Treatment may vary due to age or medical condition, Ondansetron and Dexamethasone  Airway Management Planned: LMA  Additional Equipment:   Intra-op Plan:   Post-operative Plan: Extubation in OR  Informed Consent: I have reviewed the patients History and Physical, chart,  labs and discussed the procedure including the risks, benefits and alternatives for the proposed anesthesia with the patient or authorized representative who has indicated his/her understanding and acceptance.     Dental advisory given  Plan Discussed with: CRNA  Anesthesia Plan Comments:        Anesthesia Quick Evaluation

## 2018-04-25 NOTE — Anesthesia Procedure Notes (Signed)
Procedure Name: LMA Insertion Date/Time: 04/25/2018 11:59 AM Performed by: Neldon Newport, CRNA Pre-anesthesia Checklist: Timeout performed, Patient being monitored, Suction available, Emergency Drugs available and Patient identified Patient Re-evaluated:Patient Re-evaluated prior to induction Oxygen Delivery Method: Circle system utilized Preoxygenation: Pre-oxygenation with 100% oxygen Induction Type: IV induction Ventilation: Mask ventilation without difficulty LMA: LMA inserted LMA Size: 5.0 Tube type: Oral Number of attempts: 1 Placement Confirmation: breath sounds checked- equal and bilateral,  positive ETCO2 and ETT inserted through vocal cords under direct vision Tube secured with: Tape Dental Injury: Teeth and Oropharynx as per pre-operative assessment

## 2018-04-25 NOTE — Progress Notes (Signed)
Patient ID: Mark Ayala, male   DOB: 1955-12-07, 63 y.o.   MRN: [id number]  PROGRESS NOTE    Mark Ayala  GQQ:[id number] DOB: [birth year] DOA: 04/20/2018 PCP: [name], MD   Brief Narrative:  63 year old male with history of chronic hep C, hypertension, bipolar disorder, pulmonary hypertension, alcohol abuse, last hospitalization in August 2019 with alcohol abuse and acute kidney injury, more than 5 emergency room visits since September last year secondary to falls in the setting of intoxication presented with alcohol withdrawal and desire to do detox.  He was found to have a hyponatremia which was treated with IV fluids.  He was also treated for alcohol withdrawal.  There was a concern for suicidal ideation, psychiatry recommended inpatient psychiatric hospitalization once medically stable.  He was also found to have right second toe osteomyelitis on MRI, orthopedics recommended amputation.  Patient was transferred to [facility] from [facility].   Assessment & Plan:   Principal Problem:   Suicidal ideation Active Problems:   Chronic hepatitis C without hepatic coma (HCC)   Elevated liver enzymes   Hyponatremia   Alcohol abuse   Open toe wound   Pressure injury of skin   Right second toe osteomyelitis -For surgical intervention today by orthopedics.  Continue cefepime and vancomycin.  Follow further orthopedic recommendations.  Alcohol withdrawal -Continue CIWA protocol with tapering doses of Librium.  And folic acid.  Continue multivitamins.  Increase thiamine to 500 mg daily for the next few days.  Monitor mental status -Mental status slow to improve.  Gabapentin and oxycodone have been discontinued.  Hyponatremia--improving.  Monitor  Chronic hepatitis C with reported completed treatment -Outpatient follow-up  Hypertension/pulmonary hypertension -Echo done during this admission showed EF of 65% with likely diastolic dysfunction -Continue Cozaar and  amlodipine  Suicidal ideation in a patient with bipolar disorder -Continue bedside sitter.  Patient will need inpatient psychiatric hospitalization once medically stable  Right lower extremity wounds -Follow wound care nurse recommendations.    DVT prophylaxis: Lovenox Code Status: Full Family Communication: None at bedside Disposition Plan: Depends on clinical outcome  Consultants: Orthopedic/psychiatry Procedures:  Echo IMPRESSIONS    1. The left ventricle has hyperdynamic systolic function of >67%. The cavity size was normal. There is moderately increased left ventricular wall thickness. Left ventricular diastolic Doppler parameters are consistent with impaired relaxation.  2. The right ventricle has normal systolic function. The cavity was normal. There is no increase in right ventricular wall thickness.  3. Right atrial size was mildly dilated.  4. The mitral valve is normal in structure.  5. The tricuspid valve is normal in structure.  6. The aortic valve is normal in structure.  7. Right atrial pressure is estimated at 15 mmHg.  8. The interatrial septum was not assessed.  Antimicrobials:  Anti-infectives (From admission, onward)   Start     Dose/Rate Route Frequency Ordered Stop   04/22/18 2100  vancomycin (VANCOCIN) 1,250 mg in sodium chloride 0.9 % 250 mL IVPB     1,250 mg 166.7 mL/hr over 90 Minutes Intravenous Every 12 hours 04/22/18 0820     04/22/18 0900  ceFEPIme (MAXIPIME) 2 g in sodium chloride 0.9 % 100 mL IVPB     2 g 200 mL/hr over 30 Minutes Intravenous Every 8 hours 04/22/18 0759     04/22/18 0900  vancomycin (VANCOCIN) 1,750 mg in sodium chloride 0.9 % 500 mL IVPB     1,750 mg 250 mL/hr over 120 Minutes Intravenous  Once 04/22/18 0759 04/22/18 1047        Subjective: Patient seen and examined at bedside.  He is awake, answers few questions but is slow to respond.  Poor historian.  No overnight fever or vomiting reported.  Objective: Vitals:     04/24/18 1304 04/24/18 1515 04/24/18 2151 04/25/18 0541  BP:  (!) 155/87 (!) 141/81 (!) 144/78  Pulse:  74 (!) 112 100  Resp:  18 18   Temp: (!) 97.4 F (36.3 C) (!) 97.5 F (36.4 C) 98.7 F (37.1 C) 98.1 F (36.7 C)  TempSrc: Oral Oral Oral Oral  SpO2:  97% 96% 92%  Weight:      Height:        Intake/Output Summary (Last 24 hours) at 04/25/2018 1041 Last data filed at 04/25/2018 1017 Gross per 24 hour  Intake 350 ml  Output 3275 ml  Net -2925 ml   Filed Weights   04/22/18 0331 04/23/18 0158 04/24/18 0327  Weight: 81.8 kg 84.4 kg 85.4 kg    Examination:  General exam: Appears calm and comfortable.  Poor historian.  Slow to respond to questions. Respiratory system: Bilateral decreased breath sounds at bases, no wheezing Cardiovascular system: S1 & S2 heard, Rate controlled Gastrointestinal system: Abdomen is nondistended, soft and nontender. Normal bowel sounds heard. Extremities: No cyanosis, clubbing, edema.  Right second toe wound.   Data Reviewed: I have personally reviewed following labs and imaging studies  CBC: Recent Labs  Lab 04/20/18 0554 04/21/18 0303 04/22/18 0251 04/23/18 0332 04/25/18 0521  WBC 6.9 6.6 6.8 7.6 6.9  NEUTROABS  --   --  4.1 4.7 3.8  HGB 10.0* 9.0* 8.7* 8.7* 8.6*  HCT 29.2* 26.8* 26.4* 27.2* 26.3*  MCV 100.3* 103.9* 103.9* 109.7* 105.6*  PLT 214 145* 154 155 426   Basic Metabolic Panel: Recent Labs  Lab 04/21/18 0912 04/22/18 0756 04/23/18 0332 04/24/18 0353 04/25/18 0521  NA 126* 127* 129* 128* 133*  K 3.6 3.6 4.0 4.1 4.5  CL 91* 93* 98 97* 104  CO2 26 26 24 23 22   GLUCOSE 120* 105* 96 90 97  BUN 15 13 14 16 13   CREATININE 0.85 0.80 0.83 0.95 0.91  CALCIUM 8.5* 8.3* 8.1* 7.8* 8.2*  PHOS  --   --  3.4 2.9  --    GFR: Estimated Creatinine Clearance: 92.4 mL/min (by C-G formula based on SCr of 0.91 mg/dL). Liver Function Tests: Recent Labs  Lab 04/20/18 0554 04/21/18 0303 04/23/18 0332 04/24/18 0353  AST 198*  165*  --   --   ALT 57* 50*  --   --   ALKPHOS 107 96  --   --   BILITOT 2.5* 2.4*  --   --   PROT 7.2 6.2*  --   --   ALBUMIN 3.0* 2.6* 2.3* 2.2*   No results for input(s): LIPASE, AMYLASE in the last 168 hours. Recent Labs  Lab 04/24/18 1044  AMMONIA 26   Coagulation Profile: Recent Labs  Lab 04/20/18 0757  INR 1.13   Cardiac Enzymes: No results for input(s): CKTOTAL, CKMB, CKMBINDEX, TROPONINI in the last 168 hours. BNP (last 3 results) No results for input(s): PROBNP in the last 8760 hours. HbA1C: No results for input(s): HGBA1C in the last 72 hours. CBG: No results for input(s): GLUCAP in the last 168 hours. Lipid Profile: No results for input(s): CHOL, HDL, LDLCALC, TRIG, CHOLHDL, LDLDIRECT in the last 72 hours. Thyroid Function Tests: No results for input(s): TSH,  T4TOTAL, FREET4, T3FREE, THYROIDAB in the last 72 hours. Anemia Panel: No results for input(s): VITAMINB12, FOLATE, FERRITIN, TIBC, IRON, RETICCTPCT in the last 72 hours. Sepsis Labs: No results for input(s): PROCALCITON, LATICACIDVEN in the last 168 hours.  Recent Results (from the past 240 hour(s))  MRSA PCR Screening     Status: None   Collection Time: 04/20/18  4:29 PM  Result Value Ref Range Status   MRSA by PCR NEGATIVE NEGATIVE Final    Comment:        The GeneXpert MRSA Assay (FDA approved for NASAL specimens only), is one component of a comprehensive MRSA colonization surveillance program. It is not intended to diagnose MRSA infection nor to guide or monitor treatment for MRSA infections. Performed at [facility], [place] [address] [place], [place] [address]   Surgical pcr screen     Status: None   Collection Time: 04/25/18  1:54 AM  Result Value Ref Range Status   MRSA, PCR NEGATIVE NEGATIVE Final   Staphylococcus aureus NEGATIVE NEGATIVE Final    Comment: (NOTE) The Xpert SA Assay (FDA approved for NASAL specimens in patients 40 years of age and older), is one  component of a comprehensive surveillance program. It is not intended to diagnose infection nor to guide or monitor treatment. Performed at [facility], [place] [address], [place], [place] [address]          Radiology Studies: No results found.      Scheduled Meds: . amLODipine  5 mg Oral Daily  . chlordiazePOXIDE  25 mg Oral TID   Followed by  . chlordiazePOXIDE  25 mg Oral BH-qamhs   Followed by  . [START ON 04/26/2018] chlordiazePOXIDE  25 mg Oral Daily  . feeding supplement (ENSURE ENLIVE)  237 mL Oral Q24H  . Influenza vac split quadrivalent PF  0.5 mL Intramuscular Tomorrow-1000  . losartan  100 mg Oral Daily  . mouth rinse  15 mL Mouth Rinse BID  . multivitamin with minerals  1 tablet Oral Daily  . pantoprazole  40 mg Oral Daily  . thiamine  100 mg Oral Daily   Continuous Infusions: . sodium chloride Stopped (04/22/18 0847)  . ceFEPime (MAXIPIME) IV 2 g (04/25/18 0930)  . vancomycin 1,250 mg (04/24/18 2156)     LOS: 5 days        Aline August, MD [facility] Pager [phone]  If 7PM-7AM, please contact night-coverage [email or web address] Password [facility] 04/25/2018, 10:41 AM

## 2018-04-25 NOTE — Progress Notes (Signed)
Pharmacy Antibiotic Note  Mark Ayala is a 63 y.o. male admitted on 04/20/2018 with osteo.  Pharmacy has been consulted for Vancomycin and Cefepime dosing.   ID: OM 2nd right toe. Partial toe amputation 2/17. Antimicrobials this admission:  2/14 Cefepime >>  2/14 Vancomycin >>   Microbiology results:  2/12 MRSA PCR: negative    Plan: Cefepime 2g q8h Vancomycin 1750mg  x 1, then 1250mg  q12h  - Partial toe amputation 2/17. LOT for abx? Will check Vanco levels if continued.    Height: 6' (182.9 cm) Weight: 188 lb 4.8 oz (85.4 kg) IBW/kg (Calculated) : 77.6  Temp (24hrs), Avg:97.9 F (36.6 C), Min:97.5 F (36.4 C), Max:98.7 F (37.1 C)  Recent Labs  Lab 04/20/18 0554  04/21/18 0303 04/21/18 0912 04/22/18 0251 04/22/18 0756 04/23/18 0332 04/24/18 0353 04/25/18 0521  WBC 6.9  --  6.6  --  6.8  --  7.6  --  6.9  CREATININE 0.77   < > 0.85 0.85  --  0.80 0.83 0.95 0.91   < > = values in this interval not displayed.    Estimated Creatinine Clearance: 92.4 mL/min (by C-G formula based on SCr of 0.91 mg/dL).    Allergies  Allergen Reactions  . Codeine Other (See Comments)    Caused Hyperactivity     Mark Ayala Mark Ayala, PharmD, BCPS Clinical Staff Pharmacist Mark Ayala 04/25/2018 2:15 PM

## 2018-04-26 ENCOUNTER — Encounter (HOSPITAL_COMMUNITY): Payer: Self-pay | Admitting: Orthopaedic Surgery

## 2018-04-26 LAB — CBC WITH DIFFERENTIAL/PLATELET
Abs Immature Granulocytes: 0.07 10*3/uL (ref 0.00–0.07)
Basophils Absolute: 0 10*3/uL (ref 0.0–0.1)
Basophils Relative: 0 %
Eosinophils Absolute: 0 10*3/uL (ref 0.0–0.5)
Eosinophils Relative: 0 %
HCT: 28 % — ABNORMAL LOW (ref 39.0–52.0)
Hemoglobin: 9.1 g/dL — ABNORMAL LOW (ref 13.0–17.0)
IMMATURE GRANULOCYTES: 1 %
LYMPHS PCT: 13 %
Lymphs Abs: 0.7 10*3/uL (ref 0.7–4.0)
MCH: 34.5 pg — ABNORMAL HIGH (ref 26.0–34.0)
MCHC: 32.5 g/dL (ref 30.0–36.0)
MCV: 106.1 fL — ABNORMAL HIGH (ref 80.0–100.0)
Monocytes Absolute: 0.3 10*3/uL (ref 0.1–1.0)
Monocytes Relative: 5 %
NEUTROS ABS: 4.7 10*3/uL (ref 1.7–7.7)
Neutrophils Relative %: 81 %
Platelets: 159 10*3/uL (ref 150–400)
RBC: 2.64 MIL/uL — ABNORMAL LOW (ref 4.22–5.81)
RDW: 15.3 % (ref 11.5–15.5)
WBC: 5.8 10*3/uL (ref 4.0–10.5)
nRBC: 0 % (ref 0.0–0.2)

## 2018-04-26 LAB — MAGNESIUM: Magnesium: 1.9 mg/dL (ref 1.7–2.4)

## 2018-04-26 LAB — BASIC METABOLIC PANEL
Anion gap: 9 (ref 5–15)
BUN: 15 mg/dL (ref 8–23)
CO2: 22 mmol/L (ref 22–32)
Calcium: 8.6 mg/dL — ABNORMAL LOW (ref 8.9–10.3)
Chloride: 102 mmol/L (ref 98–111)
Creatinine, Ser: 1.05 mg/dL (ref 0.61–1.24)
GFR calc Af Amer: >60 mL/min (ref >60)
GFR calc non Af Amer: >60 mL/min (ref >60)
Glucose, Bld: 122 mg/dL — ABNORMAL HIGH (ref 70–99)
Potassium: 4.6 mmol/L (ref 3.5–5.1)
Sodium: 133 mmol/L — ABNORMAL LOW (ref 135–145)

## 2018-04-26 MED ORDER — TRAMADOL HCL 50 MG PO TABS
50.0000 mg | ORAL_TABLET | Freq: Four times a day (QID) | ORAL | Status: DC | PRN
  Administered 2018-04-26 – 2018-05-06 (×19): 50 mg via ORAL
  Filled 2018-04-26 (×20): qty 1

## 2018-04-26 MED ORDER — CHLORDIAZEPOXIDE HCL 25 MG PO CAPS
25.0000 mg | ORAL_CAPSULE | Freq: Once | ORAL | Status: AC
  Administered 2018-04-26: 25 mg via ORAL
  Filled 2018-04-26: qty 1

## 2018-04-26 MED ORDER — CITALOPRAM HYDROBROMIDE 20 MG PO TABS
20.0000 mg | ORAL_TABLET | Freq: Every day | ORAL | Status: DC
  Administered 2018-04-26 – 2018-05-18 (×23): 20 mg via ORAL
  Filled 2018-04-26 (×23): qty 1

## 2018-04-26 MED ORDER — ENOXAPARIN SODIUM 40 MG/0.4ML ~~LOC~~ SOLN
40.0000 mg | SUBCUTANEOUS | Status: DC
  Administered 2018-04-26 – 2018-05-17 (×22): 40 mg via SUBCUTANEOUS
  Filled 2018-04-26 (×20): qty 0.4

## 2018-04-26 NOTE — Progress Notes (Signed)
Orthopedic Tech Progress Note Patient Details:  Mark Ayala [birth year] [id number]  Ortho Devices Type of Ortho Device: Darco shoe Ortho Device/Splint Interventions: Application   Post Interventions Patient Tolerated: Well Instructions Provided: Care of device   [name] 04/26/2018, 10:50 AM

## 2018-04-26 NOTE — Progress Notes (Signed)
Patient ID: Mark Ayala, male   DOB: 1955-11-28, 63 y.o.   MRN: [id number]  PROGRESS NOTE    Mark Ayala  WLN:[id number] DOB: [birth year] DOA: 04/20/2018 PCP: [name], MD   Brief Narrative:  63 year old male with history of chronic hep C, hypertension, bipolar disorder, pulmonary hypertension, alcohol abuse, last hospitalization in August 2019 with alcohol abuse and acute kidney injury, more than 5 emergency room visits since September last year secondary to falls in the setting of intoxication presented with alcohol withdrawal and desire to do detox.  He was found to have a hyponatremia which was treated with IV fluids.  He was also treated for alcohol withdrawal.  There was a concern for suicidal ideation, psychiatry recommended inpatient psychiatric hospitalization once medically stable.  He was also found to have right second toe osteomyelitis on MRI, orthopedics recommended amputation.  Patient was transferred to [facility] from [facility].  He underwent right second toe amputation on 04/25/2018 by orthopedics   Assessment & Plan:   Principal Problem:   Toe osteomyelitis, right ([place]) Active Problems:   Chronic hepatitis C without hepatic coma (HCC)   Elevated liver enzymes   Hyponatremia   Alcohol abuse   Suicidal ideation   Pressure injury of skin   Leg wound, right, initial encounter   Right second toe osteomyelitis -Status post right second toe amputation on 04/25/2018 by orthopedics.  Currently on cefepime and vancomycin.  Will discontinue antibiotics today and orthopedics is okay with that. -Wound care as per orthopedics recommendations.  PT evaluation.  Alcohol withdrawal -Continue CIWA protocol with tapering doses of Librium.  Continue multivitamins and folic acid.  Continue high-dose thiamine for the next few days.  Monitor mental status -Mental status slowly improving.  Gabapentin and oxycodone have been discontinued.  Hyponatremia--improving.   Monitor  Chronic hepatitis C with reported completed treatment -Outpatient follow-up  Hypertension/pulmonary hypertension -Echo done during this admission showed EF of 65% with likely diastolic dysfunction -Continue Cozaar and amlodipine  Suicidal ideation in a patient with bipolar disorder -Continue bedside sitter.  Patient will need inpatient psychiatric hospitalization once medically stable  Right lower extremity wounds -Follow wound care nurse recommendations.   DVT prophylaxis: Lovenox Code Status: Full Family Communication: None at bedside Disposition Plan: Inpatient psychiatric hospitalization probably in the next 1 or 2 days once bed is available  Consultants: Orthopedic/psychiatry Procedures:  Echo IMPRESSIONS    1. The left ventricle has hyperdynamic systolic function of >74%. The cavity size was normal. There is moderately increased left ventricular wall thickness. Left ventricular diastolic Doppler parameters are consistent with impaired relaxation.  2. The right ventricle has normal systolic function. The cavity was normal. There is no increase in right ventricular wall thickness.  3. Right atrial size was mildly dilated.  4. The mitral valve is normal in structure.  5. The tricuspid valve is normal in structure.  6. The aortic valve is normal in structure.  7. Right atrial pressure is estimated at 15 mmHg.  8. The interatrial septum was not assessed.  Antimicrobials:  Anti-infectives (From admission, onward)   Start     Dose/Rate Route Frequency Ordered Stop   04/22/18 2100  vancomycin (VANCOCIN) 1,250 mg in sodium chloride 0.9 % 250 mL IVPB  Status:  Discontinued     1,250 mg 166.7 mL/hr over 90 Minutes Intravenous Every 12 hours 04/22/18 0820 04/26/18 1011   04/22/18 0900  ceFEPIme (MAXIPIME) 2 g in sodium chloride 0.9 % 100 mL IVPB  Status:  Discontinued     2 g 200 mL/hr over 30 Minutes Intravenous Every 8 hours 04/22/18 0759 04/26/18 1011   04/22/18  0900  vancomycin (VANCOCIN) 1,750 mg in sodium chloride 0.9 % 500 mL IVPB     1,750 mg 250 mL/hr over 120 Minutes Intravenous  Once 04/22/18 0759 04/22/18 1047       Subjective: Patient seen and examined at bedside.  He is awake, answers few questions but some of the answers do not make any sense.  No overnight fever, nausea, vomiting or agitation reported by nursing staff. Objective: Vitals:   04/25/18 1755 04/25/18 2021 04/26/18 0431 04/26/18 0500  BP: (!) 152/80 (!) 154/82 (!) 152/82   Pulse: 82 95 91   Resp:  18 16   Temp:  98 F (36.7 C) 97.9 F (36.6 C)   TempSrc:  Oral Oral   SpO2:  98% 98%   Weight:    83.7 kg  Height:        Intake/Output Summary (Last 24 hours) at 04/26/2018 1036 Last data filed at 04/26/2018 0915 Gross per 24 hour  Intake 4729.68 ml  Output 2410 ml  Net 2319.68 ml   Filed Weights   04/23/18 0158 04/24/18 0327 04/26/18 0500  Weight: 84.4 kg 85.4 kg 83.7 kg    Examination:  General exam: Appears calm and comfortable.  Poor historian.  No distress.  Answering some questions.   Respiratory system: Bilateral decreased breath sounds at bases Cardiovascular system: Rate controlled, S1-S2 heard Gastrointestinal system: Abdomen is nondistended, soft and nontender. Normal bowel sounds heard. Extremities: No cyanosis, edema.  Right foot dressing present   Data Reviewed: I have personally reviewed following labs and imaging studies  CBC: Recent Labs  Lab 04/21/18 0303 04/22/18 0251 04/23/18 0332 04/25/18 0521 04/26/18 0354  WBC 6.6 6.8 7.6 6.9 5.8  NEUTROABS  --  4.1 4.7 3.8 4.7  HGB 9.0* 8.7* 8.7* 8.6* 9.1*  HCT 26.8* 26.4* 27.2* 26.3* 28.0*  MCV 103.9* 103.9* 109.7* 105.6* 106.1*  PLT 145* 154 155 156 948   Basic Metabolic Panel: Recent Labs  Lab 04/22/18 0756 04/23/18 0332 04/24/18 0353 04/25/18 0521 04/26/18 0354  NA 127* 129* 128* 133* 133*  K 3.6 4.0 4.1 4.5 4.6  CL 93* 98 97* 104 102  CO2 26 24 23 22 22   GLUCOSE 105* 96 90  97 122*  BUN 13 14 16 13 15   CREATININE 0.80 0.83 0.95 0.91 1.05  CALCIUM 8.3* 8.1* 7.8* 8.2* 8.6*  MG  --   --   --   --  1.9  PHOS  --  3.4 2.9  --   --    GFR: Estimated Creatinine Clearance: 80.1 mL/min (by C-G formula based on SCr of 1.05 mg/dL). Liver Function Tests: Recent Labs  Lab 04/20/18 0554 04/21/18 0303 04/23/18 0332 04/24/18 0353  AST 198* 165*  --   --   ALT 57* 50*  --   --   ALKPHOS 107 96  --   --   BILITOT 2.5* 2.4*  --   --   PROT 7.2 6.2*  --   --   ALBUMIN 3.0* 2.6* 2.3* 2.2*   No results for input(s): LIPASE, AMYLASE in the last 168 hours. Recent Labs  Lab 04/24/18 1044  AMMONIA 26   Coagulation Profile: Recent Labs  Lab 04/20/18 0757  INR 1.13   Cardiac Enzymes: No results for input(s): CKTOTAL, CKMB, CKMBINDEX, TROPONINI in the last 168 hours. BNP (last  3 results) No results for input(s): PROBNP in the last 8760 hours. HbA1C: No results for input(s): HGBA1C in the last 72 hours. CBG: No results for input(s): GLUCAP in the last 168 hours. Lipid Profile: No results for input(s): CHOL, HDL, LDLCALC, TRIG, CHOLHDL, LDLDIRECT in the last 72 hours. Thyroid Function Tests: No results for input(s): TSH, T4TOTAL, FREET4, T3FREE, THYROIDAB in the last 72 hours. Anemia Panel: No results for input(s): VITAMINB12, FOLATE, FERRITIN, TIBC, IRON, RETICCTPCT in the last 72 hours. Sepsis Labs: No results for input(s): PROCALCITON, LATICACIDVEN in the last 168 hours.  Recent Results (from the past 240 hour(s))  MRSA PCR Screening     Status: None   Collection Time: 04/20/18  4:29 PM  Result Value Ref Range Status   MRSA by PCR NEGATIVE NEGATIVE Final    Comment:        The GeneXpert MRSA Assay (FDA approved for NASAL specimens only), is one component of a comprehensive MRSA colonization surveillance program. It is not intended to diagnose MRSA infection nor to guide or monitor treatment for MRSA infections. Performed at [facility], [place] [address] [place], [place] [address]   Surgical pcr screen     Status: None   Collection Time: 04/25/18  1:54 AM  Result Value Ref Range Status   MRSA, PCR NEGATIVE NEGATIVE Final   Staphylococcus aureus NEGATIVE NEGATIVE Final    Comment: (NOTE) The Xpert SA Assay (FDA approved for NASAL specimens in patients 79 years of age and older), is one component of a comprehensive surveillance program. It is not intended to diagnose infection nor to guide or monitor treatment. Performed at [facility], [place] [address] [place], [place] [address]          Radiology Studies: No results found.      Scheduled Meds: . amLODipine  5 mg Oral Daily  . chlordiazePOXIDE  25 mg Oral Daily  . feeding supplement (ENSURE ENLIVE)  237 mL Oral Q24H  . folic acid  1 mg Oral Daily  . Influenza vac split quadrivalent PF  0.5 mL Intramuscular Tomorrow-1000  . losartan  100 mg Oral Daily  . mouth rinse  15 mL Mouth Rinse BID  . multivitamin with minerals  1 tablet Oral Daily  . pantoprazole  40 mg Oral Daily  . thiamine  500 mg Oral Daily   Continuous Infusions: . sodium chloride Stopped (04/22/18 0847)     LOS: 6 days        Aline August, MD [facility] Pager [phone]  If 7PM-7AM, please contact night-coverage [email or web address] Password [facility] 04/26/2018, 10:36 AM

## 2018-04-26 NOTE — Progress Notes (Signed)
Subjective: 1 Day Post-Op Procedure(s) (LRB): PARTIAL  AMPUTATION OF RIGHT 2nd TOE (Right) Patient reports pain as mild.    Objective: Vital signs in last 24 hours: Temp:  [97.5 F (36.4 C)-98 F (36.7 C)] 97.9 F (36.6 C) (02/18 0431) Pulse Rate:  [82-95] 91 (02/18 0431) Resp:  [16-19] 16 (02/18 0431) BP: (150-172)/(80-90) 152/82 (02/18 0431) SpO2:  [94 %-98 %] 98 % (02/18 0431) Weight:  [83.7 kg] 83.7 kg (02/18 0500)  Intake/Output from previous day: 02/17 0701 - 02/18 0700 In: 4129.7 [P.O.:1700; I.V.:885; IV Piggyback:1519.7] Out: 3010 [Urine:3000; Blood:10] Intake/Output this shift: No intake/output data recorded.  Recent Labs    04/25/18 0521 04/26/18 0354  HGB 8.6* 9.1*   Recent Labs    04/25/18 0521 04/26/18 0354  WBC 6.9 5.8  RBC 2.49* 2.64*  HCT 26.3* 28.0*  PLT 156 159   Recent Labs    04/25/18 0521 04/26/18 0354  NA 133* 133*  K 4.5 4.6  CL 104 102  CO2 22 22  BUN 13 15  CREATININE 0.91 1.05  GLUCOSE 97 122*  CALCIUM 8.2* 8.6*   No results for input(s): LABPT, INR in the last 72 hours.  Neurovascular intact Sensation intact distally Intact pulses distally Dorsiflexion/Plantar flexion intact Incision: dressing C/D/I No cellulitis present Compartment soft     Assessment/Plan: 1 Day Post-Op Procedure(s) (LRB): PARTIAL  AMPUTATION OF RIGHT 2nd TOE (Right) Up with therapy  Heel weight bearing in darco shoe RLE Continue with wet-to-dry dressing changes bid to right tibia wound F/u with Dr. Erlinda Hong 2 weeks post-op for suture removal      Aundra Dubin 04/26/2018, 7:40 AM

## 2018-04-26 NOTE — Progress Notes (Signed)
Nutrition Follow-up  DOCUMENTATION CODES:   Not applicable  INTERVENTION:   Double portions on meal trays  Snacks between meals  Continue Ensure Enlive po BID, each supplement provides 350 kcal and 20 grams of protein daily  Continue MVI with Minerals   NUTRITION DIAGNOSIS:   Increased nutrient needs related to acute illness as evidenced by estimated needs.  Being addressed via supplements, snacks, double portions  GOAL:   Patient will meet greater than or equal to 90% of their needs  Progressing  MONITOR:   PO intake, Supplement acceptance, Weight trends, Labs  REASON FOR ASSESSMENT:   Consult Assessment of nutrition requirement/status  ASSESSMENT:   63 y.o. male with medical history significant of depression, alcohol abuse, hypertension, reflux, hepatitis C presenting with desire to detox.   2/17 Amputation of right 2nd toe, I & D of subcutaneous tissue of RL leg  Pt alert, confused. Pt with 1:1 sitter at bedside.  Recorded po intake 84% on average. Per CNA sitter, pt with excellent appetite. Reporting that pt not full after eating meal trays and asking for more food. CNA is unsure if pt has been taking Ensure daily but believes that pt would like more food on meal trays and snacks between meals  Current wt 83.7 kg; admission wt 83.7 kg. Weight has fluctuated up and down since admission  Labs: sodium 133, no CBGs, glucose 97 Meds: folic acid, MVI, thiamine  Diet Order:   Diet Order            Diet Heart Room service appropriate? Yes; Fluid consistency: Thin  Diet effective now              EDUCATION NEEDS:   Not appropriate for education at this time  Skin:  Skin Assessment: Skin Integrity Issues: Skin Integrity Issues:: DTI DTI: sacrum Stage I: R toe Unstageable: R and L tibia, R pre-tibia, R toe  Last BM:  2/17  Height:   Ht Readings from Last 1 Encounters:  04/20/18 6' (1.829 m)    Weight:   Wt Readings from Last 1 Encounters:   04/26/18 83.7 kg    Ideal Body Weight:  80.91 kg  BMI:  Body mass index is 25.03 kg/m.  Estimated Nutritional Needs:   Kcal:  2000-2200 kcal  Protein:  100-110 grams  Fluid:  >/= 2 L/day  Kerman Passey MS, RD, LDN, CNSC [phone] Pager  [phone] Weekend/On-Call Pager

## 2018-04-26 NOTE — Evaluation (Signed)
Physical Therapy Evaluation Patient Details Name: Mark Ayala MRN: [id number] DOB: 03/11/1955 Today's Date: 04/26/2018   History of Present Illness  63 year old male with history of chronic hep C, hypertension, bipolar disorder, pulmonary hypertension, alcohol abuse admitted with alcohol intoxication with desire to detox.  Due to concern for SI was seen by psychiatry who recommended inpatient psychiatric admission when stable.  Underwent R second toe partial amputation on 04/25/2018 due to osteomyelitis.   Clinical Impression  Patient presents with decreased independence with mobility due to weakness, pain and limited weight bearing R LE, decreased balance and poor safety awareness.  He will benefit from skilled PT in the acute setting to allow maximized mobility and safety prior to d/c to inpatient psychiatric hospital with PT to continue.      Follow Up Recommendations Other (comment)(follow up PT at psychiatric hospital)    Equipment Recommendations  Rolling walker with 5" wheels    Recommendations for Other Services       Precautions / Restrictions Precautions Precautions: Fall Required Braces or Orthoses: Other Brace Other Brace: Darco shoe on R Restrictions Weight Bearing Restrictions: Yes RLE Weight Bearing: (heel weight bearing) Other Position/Activity Restrictions: heel weight bearing in Darco shoe      Mobility  Bed Mobility Overal bed mobility: Needs Assistance Bed Mobility: Supine to Sit     Supine to sit: Supervision;HOB elevated     General bed mobility comments: increased time  Transfers Overall transfer level: Needs assistance Equipment used: Rolling walker (2 wheeled) Transfers: Sit to/from Stand Sit to Stand: Mod assist         General transfer comment: lifting help from EOB with cues for hand placement, first time stood without walker, and pt requesting something to hold onto and leaning back initially  Ambulation/Gait Ambulation/Gait assistance:  Min assist Gait Distance (Feet): 150 Feet Assistive device: Rolling walker (2 wheeled) Gait Pattern/deviations: Step-to pattern;Trunk flexed;Wide base of support;Decreased stride length     General Gait Details: mod cues throughout to step into walker and for forward gaze as flexed and looking at floor; maintains increased proximity from walker and large step to pattern with heel wedge Darco but keeping weight on heel.    Stairs            Wheelchair Mobility    Modified Rankin (Stroke Patients Only)       Balance Overall balance assessment: Needs assistance   Sitting balance-Leahy Scale: Good Sitting balance - Comments: able to don sock on L foot with reaching to floor, then with L crossed over R but increased time required   Standing balance support: Bilateral upper extremity supported;Single extremity supported Standing balance-Leahy Scale: Poor Standing balance comment: posterior bias and min to mod A needed initially for balance                             Pertinent Vitals/Pain Pain Assessment: Faces Faces Pain Scale: Hurts a little bit Pain Location: R toes Pain Descriptors / Indicators: Sore Pain Intervention(s): Monitored during session    Home Living Family/patient expects to be discharged to:: Other (Comment)([facility])                 Additional Comments: plans for inpatient psych admission at d/c    Prior Function Level of Independence: Independent with assistive device(s)         Comments: used cane and has had multiple falls     Hand Dominance  Extremity/Trunk Assessment   Upper Extremity Assessment Upper Extremity Assessment: Overall WFL for tasks assessed    Lower Extremity Assessment Lower Extremity Assessment: Generalized weakness    Cervical / Trunk Assessment Cervical / Trunk Assessment: Kyphotic;Other exceptions Cervical / Trunk Exceptions: flexed posture with forward head  Communication    Communication: No difficulties  Cognition Arousal/Alertness: Awake/alert Behavior During Therapy: Impulsive Overall Cognitive Status: No family/caregiver present to determine baseline cognitive functioning                                 General Comments: decreased safety awareness, agitated with me for directing him how to use the walker, wants to use the cane, though obviously needs two hands to support himself      General Comments General comments (skin integrity, edema, etc.): safety sitter at the bedside and assisting with care    Exercises     Assessment/Plan    PT Assessment Patient needs continued PT services  PT Problem List Decreased balance;Decreased knowledge of use of DME;Decreased knowledge of precautions;Decreased mobility;Decreased safety awareness       PT Treatment Interventions DME instruction;Functional mobility training;Balance training;Patient/family education;Gait training;Therapeutic activities;Therapeutic exercise    PT Goals (Current goals can be found in the Care Plan section)  Acute Rehab PT Goals Patient Stated Goal: none stated PT Goal Formulation: With patient Time For Goal Achievement: 05/03/18 Potential to Achieve Goals: Good    Frequency Min 3X/week   Barriers to discharge        Co-evaluation               AM-PAC PT "6 Clicks" Mobility  Outcome Measure Help needed turning from your back to your side while in a flat bed without using bedrails?: None Help needed moving from lying on your back to sitting on the side of a flat bed without using bedrails?: None Help needed moving to and from a bed to a chair (including a wheelchair)?: A Little Help needed standing up from a chair using your arms (e.g., wheelchair or bedside chair)?: A Lot Help needed to walk in hospital room?: A Little Help needed climbing 3-5 steps with a railing? : A Lot 6 Click Score: 18    End of Session Equipment Utilized During Treatment: Gait  belt;Other (comment)(Darco shoe) Activity Tolerance: Patient tolerated treatment well Patient left: with call bell/phone within reach;in chair;with chair alarm set;with nursing/sitter in room   PT Visit Diagnosis: Other abnormalities of gait and mobility (R26.89);History of falling (Z91.81)    Time: 7209-4709 PT Time Calculation (min) (ACUTE ONLY): 29 min   Charges:   PT Evaluation $PT Eval Moderate Complexity: 1 Mod PT Treatments $Gait Training: 8-22 mins        Magda Kiel, [place] [facility] [phone] 04/26/2018   Reginia Naas 04/26/2018, 12:46 PM

## 2018-04-27 MED ORDER — TAMSULOSIN HCL 0.4 MG PO CAPS
0.4000 mg | ORAL_CAPSULE | Freq: Every day | ORAL | Status: DC
  Administered 2018-04-27 – 2018-05-18 (×22): 0.4 mg via ORAL
  Filled 2018-04-27 (×22): qty 1

## 2018-04-27 NOTE — Progress Notes (Signed)
CSW received consult for inpatient psych placement. Patient will not be able to go with a foley per psych policy. MD aware and undergoing foley trial tomorrow.   Mark Ayala Mark Frick LCSW [phone]

## 2018-04-27 NOTE — Progress Notes (Signed)
PROGRESS NOTE        PATIENT DETAILS Name: Mark Ayala Age: 63 y.o. Sex: male Date of Birth: May 05, 1955 Admit Date: 04/20/2018 Admitting Physician A Melven Sartorius., MD AYT:KZSW, [name], MD  Brief Narrative: Patient is a 63 y.o. male with history of alcohol use, recurrent ED visits for falls secondary to alcohol intoxication, bipolar disorder, chronic hepatitis C-presented with hyponatremia, alcohol withdrawal symptoms.  He then exhibited suicidal ideation, evaluated by psychiatry with recommendations to transfer to inpatient psychiatry when medically stable.  See below for further details  Subjective: Denies any chest pain or shortness of breath.  Per RN-had acute urinary retention yesterday evening requiring Foley catheterization.  Assessment/Plan: Right second toe osteomyelitis: Underwent right second toe amputation on 2/17, all antimicrobial therapy was discontinued on 2/18.  Orthopedics recommending heel weightbearing in Darco shoe to right lower extremity, wet-to-dry dressing changes twice daily.  Will need follow-up with orthopedics in 2 weeks for suture removal.  Alcohol withdrawal: Improved and has completed a course of Librium taper.  Acute urinary retention: Continue Flomax-voiding trial will be attempted on 2/20.  Hypertension: Controlled-continue Cozaar and amlodipine.  Right lower extremity with scattered ulcerations: Present prior to admission-continue wound care per wound care team.  Hyponatremia: Improved-monitor periodically  Suicidal ideation: Evaluated by psychiatry service-awaiting transfer to inpatient psychiatry.  DVT Prophylaxis: Prophylactic Lovenox   Code Status: Full code   Family Communication: None at bedside  Disposition Plan: Remain inpatient  Antimicrobial agents: Anti-infectives (From admission, onward)   Start     Dose/Rate Route Frequency Ordered Stop   04/22/18 2100  vancomycin (VANCOCIN) 1,250 mg in  sodium chloride 0.9 % 250 mL IVPB  Status:  Discontinued     1,250 mg 166.7 mL/hr over 90 Minutes Intravenous Every 12 hours 04/22/18 0820 04/26/18 1011   04/22/18 0900  ceFEPIme (MAXIPIME) 2 g in sodium chloride 0.9 % 100 mL IVPB  Status:  Discontinued     2 g 200 mL/hr over 30 Minutes Intravenous Every 8 hours 04/22/18 0759 04/26/18 1011   04/22/18 0900  vancomycin (VANCOCIN) 1,750 mg in sodium chloride 0.9 % 500 mL IVPB     1,750 mg 250 mL/hr over 120 Minutes Intravenous  Once 04/22/18 0759 04/22/18 1047      Procedures: 2/17>> 1.  Amputation of right second toe through PIP joint 2.  Adjacent tissue rearrangement right second toe 2 cm 3.  Irrigation debridement of subcutaneous tissue 2 cm  CONSULTS:  orthopedic surgery  Time spent: 25- minutes-Greater than 50% of this time was spent in counseling, explanation of diagnosis, planning of further management, and coordination of care.  MEDICATIONS: Scheduled Meds: . amLODipine  5 mg Oral Daily  . citalopram  20 mg Oral Daily  . enoxaparin (LOVENOX) injection  40 mg Subcutaneous Q24H  . feeding supplement (ENSURE ENLIVE)  237 mL Oral Q24H  . folic acid  1 mg Oral Daily  . Influenza vac split quadrivalent PF  0.5 mL Intramuscular Tomorrow-1000  . losartan  100 mg Oral Daily  . mouth rinse  15 mL Mouth Rinse BID  . multivitamin with minerals  1 tablet Oral Daily  . pantoprazole  40 mg Oral Daily  . tamsulosin  0.4 mg Oral Daily  . thiamine  500 mg Oral Daily   Continuous Infusions: . sodium chloride Stopped (04/22/18 0847)   PRN  Meds:.sodium chloride, acetaminophen **OR** acetaminophen, labetalol, nicotine, polyethylene glycol, traMADol   PHYSICAL EXAM: Vital signs: Vitals:   04/26/18 1429 04/26/18 2136 04/27/18 0602 04/27/18 1350  BP: 125/68 (!) 105/53 117/66 128/72  Pulse: 93 96 85 97  Resp: 16 18 19 18   Temp: 98 F (36.7 C) 99.1 F (37.3 C) 98.1 F (36.7 C) 98 F (36.7 C)  TempSrc: Oral Oral Oral Oral  SpO2:  100% 95% 95% 96%  Weight:      Height:       Filed Weights   04/23/18 0158 04/24/18 0327 04/26/18 0500  Weight: 84.4 kg 85.4 kg 83.7 kg   Body mass index is 25.03 kg/m.   General appearance :Awake, alert, not in any distress. Speech Clear. HEENT: Atraumatic and Normocephalic Neck: supple Resp:Good air entry bilaterally, no added sounds  CVS: S1 S2 regular, no murmurs.  GI: Bowel sounds present, Non tender and not distended with no gaurding, rigidity or rebound.No organomegaly Extremities: B/L Lower Ext shows no edema   I have personally reviewed following labs and imaging studies  LABORATORY DATA: CBC: Recent Labs  Lab 04/21/18 0303 04/22/18 0251 04/23/18 0332 04/25/18 0521 04/26/18 0354  WBC 6.6 6.8 7.6 6.9 5.8  NEUTROABS  --  4.1 4.7 3.8 4.7  HGB 9.0* 8.7* 8.7* 8.6* 9.1*  HCT 26.8* 26.4* 27.2* 26.3* 28.0*  MCV 103.9* 103.9* 109.7* 105.6* 106.1*  PLT 145* 154 155 156 093    Basic Metabolic Panel: Recent Labs  Lab 04/22/18 0756 04/23/18 0332 04/24/18 0353 04/25/18 0521 04/26/18 0354  NA 127* 129* 128* 133* 133*  K 3.6 4.0 4.1 4.5 4.6  CL 93* 98 97* 104 102  CO2 26 24 23 22 22   GLUCOSE 105* 96 90 97 122*  BUN 13 14 16 13 15   CREATININE 0.80 0.83 0.95 0.91 1.05  CALCIUM 8.3* 8.1* 7.8* 8.2* 8.6*  MG  --   --   --   --  1.9  PHOS  --  3.4 2.9  --   --     GFR: Estimated Creatinine Clearance: 80.1 mL/min (by C-G formula based on SCr of 1.05 mg/dL).  Liver Function Tests: Recent Labs  Lab 04/21/18 0303 04/23/18 0332 04/24/18 0353  AST 165*  --   --   ALT 50*  --   --   ALKPHOS 96  --   --   BILITOT 2.4*  --   --   PROT 6.2*  --   --   ALBUMIN 2.6* 2.3* 2.2*   No results for input(s): LIPASE, AMYLASE in the last 168 hours. Recent Labs  Lab 04/24/18 1044  AMMONIA 26    Coagulation Profile: No results for input(s): INR, PROTIME in the last 168 hours.  Cardiac Enzymes: No results for input(s): CKTOTAL, CKMB, CKMBINDEX, TROPONINI in the last  168 hours.  BNP (last 3 results) No results for input(s): PROBNP in the last 8760 hours.  HbA1C: No results for input(s): HGBA1C in the last 72 hours.  CBG: No results for input(s): GLUCAP in the last 168 hours.  Lipid Profile: No results for input(s): CHOL, HDL, LDLCALC, TRIG, CHOLHDL, LDLDIRECT in the last 72 hours.  Thyroid Function Tests: No results for input(s): TSH, T4TOTAL, FREET4, T3FREE, THYROIDAB in the last 72 hours.  Anemia Panel: No results for input(s): VITAMINB12, FOLATE, FERRITIN, TIBC, IRON, RETICCTPCT in the last 72 hours.  Urine analysis:    Component Value Date/Time   COLORURINE STRAW (A) 04/20/2018 [place]  04/20/2018 0850   LABSPEC 1.004 (L) 04/20/2018 0850   PHURINE 6.0 04/20/2018 0850   GLUCOSEU NEGATIVE 04/20/2018 0850   HGBUR MODERATE (A) 04/20/2018 0850   BILIRUBINUR NEGATIVE 04/20/2018 0850   KETONESUR NEGATIVE 04/20/2018 0850   PROTEINUR NEGATIVE 04/20/2018 0850   NITRITE NEGATIVE 04/20/2018 0850   LEUKOCYTESUR NEGATIVE 04/20/2018 0850    Sepsis Labs: Lactic Acid, Venous No results found for: LATICACIDVEN  MICROBIOLOGY: Recent Results (from the past 240 hour(s))  MRSA PCR Screening     Status: None   Collection Time: 04/20/18  4:29 PM  Result Value Ref Range Status   MRSA by PCR NEGATIVE NEGATIVE Final    Comment:        The GeneXpert MRSA Assay (FDA approved for NASAL specimens only), is one component of a comprehensive MRSA colonization surveillance program. It is not intended to diagnose MRSA infection nor to guide or monitor treatment for MRSA infections. Performed at [facility], [place] [address], [place], [place] [address]   Surgical pcr screen     Status: None   Collection Time: 04/25/18  1:54 AM  Result Value Ref Range Status   MRSA, PCR NEGATIVE NEGATIVE Final   Staphylococcus aureus NEGATIVE NEGATIVE Final    Comment: (NOTE) The Xpert SA Assay (FDA approved for NASAL specimens in  patients 55 years of age and older), is one component of a comprehensive surveillance program. It is not intended to diagnose infection nor to guide or monitor treatment. Performed at [facility], [place] [address] [place], [place] [address]     RADIOLOGY STUDIES/RESULTS: Mr Dellie Catholic Right Wo Contrast  Result Date: 04/21/2018 CLINICAL DATA:  Right second toe redness, pain and swelling. Skin ulcerations. EXAM: MRI OF THE RIGHT TOES WITHOUT CONTRAST TECHNIQUE: Multiplanar, multisequence MR imaging of the right foot was performed. No intravenous contrast was administered. COMPARISON:  Plain films right foot 04/20/2018. FINDINGS: Patient motion degrades the study. Bones/Joint/Cartilage There is marrow edema in the distal phalanx of the second toe worrisome for osteomyelitis. Hallux valgus is noted. The patient has some osteoarthritic change about the first and second MTP joints. Subchondral edema in the head of the second metatarsal is noted. Ligaments Appear intact. Muscles and Tendons There is some atrophy of intrinsic musculature of the foot. No intramuscular fluid collection. Soft tissues Subcutaneous edema is seen over the dorsum of the foot. No joint effusion or abscess. IMPRESSION: Motion degraded examination demonstrates marrow edema in the distal phalanx of the second toe most consistent with osteomyelitis. Negative for abscess, myositis or septic joint. 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 Hallux valgus. Electronically Signed   By: Inge Rise M.D.   On: 04/21/2018 16:00   Dg Foot Complete Right  Result Date: 04/20/2018 CLINICAL DATA:  Swollen right foot.  No specific injury. EXAM: RIGHT FOOT COMPLETE - 3+ VIEW COMPARISON:  None. FINDINGS: Diffuse soft tissue swelling is noted, mainly along the dorsum of the foot. There are mild degenerative changes but no acute bony findings or destructive bony changes. Degenerative changes at the first MTP joint  with hallux valgus deformity. Small calcaneal heel spur. IMPRESSION: Mild degenerative changes but no acute bony findings. Electronically Signed   By: Marijo Sanes M.D.   On: 04/20/2018 11:31   Vas [place] Burnard Bunting With/wo Tbi  Result Date: 04/20/2018 LOWER EXTREMITY DOPPLER STUDY Indications: Ulceration, and peripheral artery disease.  Performing Technologist: Birdena Crandall, [place] [facility]  Examination Guidelines: A complete evaluation includes at minimum, Doppler  waveform signals and systolic blood pressure reading at the level of bilateral brachial, anterior tibial, and posterior tibial arteries, when vessel segments are accessible. Bilateral testing is considered an integral part of a complete examination. Photoelectric Plethysmograph (PPG) waveforms and toe systolic pressure readings are included as required and additional duplex testing as needed. Limited examinations for reoccurring indications may be performed as noted.  ABI Findings: +---------+------------------+-----+---------+---------------------------------+ Right    Rt Pressure (mmHg)IndexWaveform Comment                           +---------+------------------+-----+---------+---------------------------------+ Brachial 180                    triphasic                                  +---------+------------------+-----+---------+---------------------------------+ PTA      246               1.28 biphasic                                   +---------+------------------+-----+---------+---------------------------------+ DP       245               1.28 biphasic                                   +---------+------------------+-----+---------+---------------------------------+ Great Toe                                Unable to obtain due to constant                                           involuntary movement of the foot  +---------+------------------+-----+---------+---------------------------------+  +---------+------------------+-----+----------+--------------------------------+ Left     Lt Pressure (mmHg)IndexWaveform  Comment                          +---------+------------------+-----+----------+--------------------------------+ Brachial 192                    triphasic                                  +---------+------------------+-----+----------+--------------------------------+ PTA      221               1.15 monophasic                                 +---------+------------------+-----+----------+--------------------------------+ DP       199               1.04 monophasic                                 +---------+------------------+-----+----------+--------------------------------+ Great Toe  Unable to obtan due to constan                                             involuntary movement of the foot +---------+------------------+-----+----------+--------------------------------+ +-------+-----------+--------------------------------+------------+------------+ ABI/TBIToday's ABIToday's TBI                     Previous ABIPrevious TBI +-------+-----------+--------------------------------+------------+------------+ Right  1.28       Unable to obtain see comment                                               above                                                    +-------+-----------+--------------------------------+------------+------------+ Left   1.15       Unable to obtain see comment                                               above                                                    +-------+-----------+--------------------------------+------------+------------+  Summary: Right: Resting right ankle-brachial index is within normal range. No evidence of significant right lower extremity arterial disease. Left: Resting left ankle-brachial index is within normal range. No evidence of significant left lower  extremity arterial disease.  *See table(s) above for measurements and observations.  Electronically signed by Curt Jews MD on 04/20/2018 at 3:09:22 PM.    Final    Dg Hip Unilat With Pelvis 2-3 Views Left  Result Date: 04/20/2018 CLINICAL DATA:  Left hip region pain EXAM: DG HIP (WITH OR WITHOUT PELVIS) 2-3V LEFT COMPARISON:  None. FINDINGS: Frontal pelvis as well as frontal and lateral left hip images were obtained. There is no acute fracture or dislocation. There is marked narrowing of the left hip joint. There are cystic changes in the left femoral head with areas of intermingled sclerosis and slight cortical irregularity. There is moderate narrowing of the right hip joint. Sacroiliac joints appear normal bilaterally. No bony destruction. There are soft tissue calcifications in the midline perineal region. 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 There is milder osteoarthritic change in the right hip joint. No fracture or dislocation. Small calcifications in the midline perineum, likely due to chronic inflammation. Electronically Signed   By: Lowella Grip III M.D.   On: 04/20/2018 10:02     LOS: 7 days   Oren Binet, MD  [facility]  If 7PM-7AM, please contact night-coverage  Please page via [email or web address]  Go to [email or web address] and use [facility]'s universal password to access. If you do not have the password, please contact the hospital operator.  Locate the [facility] provider you  are looking for under [facility] and page to a number that you can be directly reached. If you still have difficulty reaching the provider, please page the [facility] (Director on Call) for the Hospitalists listed on amion for assistance.  04/27/2018, 3:20 PM

## 2018-04-28 LAB — HEPATIC FUNCTION PANEL
ALT: 34 U/L (ref 0–44)
AST: 65 U/L — ABNORMAL HIGH (ref 15–41)
Albumin: 2.1 g/dL — ABNORMAL LOW (ref 3.5–5.0)
Alkaline Phosphatase: 80 U/L (ref 38–126)
BILIRUBIN DIRECT: 0.4 mg/dL — AB (ref 0.0–0.2)
BILIRUBIN INDIRECT: 0.7 mg/dL (ref 0.3–0.9)
Total Bilirubin: 1.1 mg/dL (ref 0.3–1.2)
Total Protein: 6 g/dL — ABNORMAL LOW (ref 6.5–8.1)

## 2018-04-28 NOTE — Progress Notes (Signed)
PROGRESS NOTE        PATIENT DETAILS Name: Mark Ayala Age: 63 y.o. Sex: male Date of Birth: [birth year] Admit Date: 04/20/2018 Admitting Physician A Melven Sartorius., MD XTK:WIOX, [name], MD  Brief Narrative: Patient is a 63 y.o. male with history of alcohol use, recurrent ED visits for falls secondary to alcohol intoxication, bipolar disorder, chronic hepatitis C-presented with hyponatremia, alcohol withdrawal symptoms.  He then exhibited suicidal ideation, evaluated by psychiatry with recommendations to transfer to inpatient psychiatry when medically stable.  See below for further details  Subjective: Lying comfortably in bed-no major issues overnight.  Foley catheter discontinued earlier this morning.  Assessment/Plan: Right second toe osteomyelitis: Underwent right second toe amputation on 2/17, all antimicrobial therapy was discontinued on 2/18.  Orthopedics recommending heel weightbearing in Darco shoe to right lower extremity, wet-to-dry dressing changes twice daily.  Will need follow-up with orthopedics in 2 weeks for suture removal.  Alcohol withdrawal: Improved, and has completed a course of Librium taper.   Acute urinary retention: Continue Flomax-Foley catheter discontinued on 2/20-follow to see if patient can void.    Hypertension: Controlled-continue Cozaar and amlodipine.    Right lower extremity with scattered ulcerations: Present prior to admission-continue wound care per wound care team.  Hyponatremia: Improved-follow periodically.  Suicidal ideation: Evaluated by psychiatry service-awaiting transfer to inpatient psychiatry.  DVT Prophylaxis: Prophylactic Lovenox   Code Status: Full code   Family Communication: None at bedside  Disposition Plan: Remain inpatient  Antimicrobial agents: Anti-infectives (From admission, onward)   Start     Dose/Rate Route Frequency Ordered Stop   04/22/18 2100  vancomycin (VANCOCIN) 1,250 mg in  sodium chloride 0.9 % 250 mL IVPB  Status:  Discontinued     1,250 mg 166.7 mL/hr over 90 Minutes Intravenous Every 12 hours 04/22/18 0820 04/26/18 1011   04/22/18 0900  ceFEPIme (MAXIPIME) 2 g in sodium chloride 0.9 % 100 mL IVPB  Status:  Discontinued     2 g 200 mL/hr over 30 Minutes Intravenous Every 8 hours 04/22/18 0759 04/26/18 1011   04/22/18 0900  vancomycin (VANCOCIN) 1,750 mg in sodium chloride 0.9 % 500 mL IVPB     1,750 mg 250 mL/hr over 120 Minutes Intravenous  Once 04/22/18 0759 04/22/18 1047      Procedures: 2/17>> 1.  Amputation of right second toe through PIP joint 2.  Adjacent tissue rearrangement right second toe 2 cm 3.  Irrigation debridement of subcutaneous tissue 2 cm  CONSULTS:  orthopedic surgery  Time spent: 25- minutes-Greater than 50% of this time was spent in counseling, explanation of diagnosis, planning of further management, and coordination of care.  MEDICATIONS: Scheduled Meds: . amLODipine  5 mg Oral Daily  . citalopram  20 mg Oral Daily  . enoxaparin (LOVENOX) injection  40 mg Subcutaneous Q24H  . feeding supplement (ENSURE ENLIVE)  237 mL Oral Q24H  . folic acid  1 mg Oral Daily  . Influenza vac split quadrivalent PF  0.5 mL Intramuscular Tomorrow-1000  . losartan  100 mg Oral Daily  . mouth rinse  15 mL Mouth Rinse BID  . multivitamin with minerals  1 tablet Oral Daily  . pantoprazole  40 mg Oral Daily  . tamsulosin  0.4 mg Oral Daily  . thiamine  500 mg Oral Daily   Continuous Infusions: . sodium chloride Stopped (04/22/18  89)   PRN Meds:.sodium chloride, acetaminophen **OR** acetaminophen, labetalol, nicotine, polyethylene glycol, traMADol   PHYSICAL EXAM: Vital signs: Vitals:   04/27/18 2109 04/28/18 0550 04/28/18 0603 04/28/18 0952  BP: (!) 149/95 133/67  125/70  Pulse: 94 71    Resp: 16 16    Temp: 98.8 F (37.1 C) 98.4 F (36.9 C)    TempSrc: Oral     SpO2: 97% 100%    Weight:   89.6 kg   Height:       Filed  Weights   04/24/18 0327 04/26/18 0500 04/28/18 0603  Weight: 85.4 kg 83.7 kg 89.6 kg   Body mass index is 26.79 kg/m.   General appearance:Awake, alert, not in any distress.  Eyes:no scleral icterus. HEENT: Atraumatic and Normocephalic Neck: supple, no JVD. Resp:Good air entry bilaterally,no rales or rhonchi CVS: S1 S2 regular, no murmurs.  GI: Bowel sounds present, Non tender and not distended with no gaurding, rigidity or rebound. Extremities: B/L Lower Ext shows no edema, both legs are warm to touch Neurology:  Non focal  I have personally reviewed following labs and imaging studies  LABORATORY DATA: CBC: Recent Labs  Lab 04/22/18 0251 04/23/18 0332 04/25/18 0521 04/26/18 0354  WBC 6.8 7.6 6.9 5.8  NEUTROABS 4.1 4.7 3.8 4.7  HGB 8.7* 8.7* 8.6* 9.1*  HCT 26.4* 27.2* 26.3* 28.0*  MCV 103.9* 109.7* 105.6* 106.1*  PLT 154 155 156 622    Basic Metabolic Panel: Recent Labs  Lab 04/22/18 0756 04/23/18 0332 04/24/18 0353 04/25/18 0521 04/26/18 0354  NA 127* 129* 128* 133* 133*  K 3.6 4.0 4.1 4.5 4.6  CL 93* 98 97* 104 102  CO2 26 24 23 22 22   GLUCOSE 105* 96 90 97 122*  BUN 13 14 16 13 15   CREATININE 0.80 0.83 0.95 0.91 1.05  CALCIUM 8.3* 8.1* 7.8* 8.2* 8.6*  MG  --   --   --   --  1.9  PHOS  --  3.4 2.9  --   --     GFR: Estimated Creatinine Clearance: 80.1 mL/min (by C-G formula based on SCr of 1.05 mg/dL).  Liver Function Tests: Recent Labs  Lab 04/23/18 0332 04/24/18 0353 04/28/18 0426  AST  --   --  65*  ALT  --   --  34  ALKPHOS  --   --  80  BILITOT  --   --  1.1  PROT  --   --  6.0*  ALBUMIN 2.3* 2.2* 2.1*   No results for input(s): LIPASE, AMYLASE in the last 168 hours. Recent Labs  Lab 04/24/18 1044  AMMONIA 26    Coagulation Profile: No results for input(s): INR, PROTIME in the last 168 hours.  Cardiac Enzymes: No results for input(s): CKTOTAL, CKMB, CKMBINDEX, TROPONINI in the last 168 hours.  BNP (last 3 results) No results  for input(s): PROBNP in the last 8760 hours.  HbA1C: No results for input(s): HGBA1C in the last 72 hours.  CBG: No results for input(s): GLUCAP in the last 168 hours.  Lipid Profile: No results for input(s): CHOL, HDL, LDLCALC, TRIG, CHOLHDL, LDLDIRECT in the last 72 hours.  Thyroid Function Tests: No results for input(s): TSH, T4TOTAL, FREET4, T3FREE, THYROIDAB in the last 72 hours.  Anemia Panel: No results for input(s): VITAMINB12, FOLATE, FERRITIN, TIBC, IRON, RETICCTPCT in the last 72 hours.  Urine analysis:    Component Value Date/Time   COLORURINE STRAW (A) 04/20/2018 [place] 04/20/2018  0850   LABSPEC 1.004 (L) 04/20/2018 0850   PHURINE 6.0 04/20/2018 0850   GLUCOSEU NEGATIVE 04/20/2018 0850   HGBUR MODERATE (A) 04/20/2018 0850   BILIRUBINUR NEGATIVE 04/20/2018 0850   KETONESUR NEGATIVE 04/20/2018 0850   PROTEINUR NEGATIVE 04/20/2018 0850   NITRITE NEGATIVE 04/20/2018 0850   LEUKOCYTESUR NEGATIVE 04/20/2018 0850    Sepsis Labs: Lactic Acid, Venous No results found for: LATICACIDVEN  MICROBIOLOGY: Recent Results (from the past 240 hour(s))  MRSA PCR Screening     Status: None   Collection Time: 04/20/18  4:29 PM  Result Value Ref Range Status   MRSA by PCR NEGATIVE NEGATIVE Final    Comment:        The GeneXpert MRSA Assay (FDA approved for NASAL specimens only), is one component of a comprehensive MRSA colonization surveillance program. It is not intended to diagnose MRSA infection nor to guide or monitor treatment for MRSA infections. Performed at [facility], [place] [address] [place], [place] [address]   Surgical pcr screen     Status: None   Collection Time: 04/25/18  1:54 AM  Result Value Ref Range Status   MRSA, PCR NEGATIVE NEGATIVE Final   Staphylococcus aureus NEGATIVE NEGATIVE Final    Comment: (NOTE) The Xpert SA Assay (FDA approved for NASAL specimens in patients 69 years of age and older), is one  component of a comprehensive surveillance program. It is not intended to diagnose infection nor to guide or monitor treatment. Performed at [facility], [place] [address], [place], [place] [address]     RADIOLOGY STUDIES/RESULTS: Mr Dellie Catholic Right Wo Contrast  Result Date: 04/21/2018 CLINICAL DATA:  Right second toe redness, pain and swelling. Skin ulcerations. EXAM: MRI OF THE RIGHT TOES WITHOUT CONTRAST TECHNIQUE: Multiplanar, multisequence MR imaging of the right foot was performed. No intravenous contrast was administered. COMPARISON:  Plain films right foot 04/20/2018. FINDINGS: Patient motion degrades the study. Bones/Joint/Cartilage There is marrow edema in the distal phalanx of the second toe worrisome for osteomyelitis. Hallux valgus is noted. The patient has some osteoarthritic change about the first and second MTP joints. Subchondral edema in the head of the second metatarsal is noted. Ligaments Appear intact. Muscles and Tendons There is some atrophy of intrinsic musculature of the foot. No intramuscular fluid collection. Soft tissues Subcutaneous edema is seen over the dorsum of the foot. No joint effusion or abscess. IMPRESSION: Motion degraded examination demonstrates marrow edema in the distal phalanx of the second toe most consistent with osteomyelitis. Negative for abscess, myositis or septic joint. 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 Hallux valgus. Electronically Signed   By: Inge Rise M.D.   On: 04/21/2018 16:00   Dg Foot Complete Right  Result Date: 04/20/2018 CLINICAL DATA:  Swollen right foot.  No specific injury. EXAM: RIGHT FOOT COMPLETE - 3+ VIEW COMPARISON:  None. FINDINGS: Diffuse soft tissue swelling is noted, mainly along the dorsum of the foot. There are mild degenerative changes but no acute bony findings or destructive bony changes. Degenerative changes at the first MTP joint with hallux valgus deformity. Small calcaneal  heel spur. IMPRESSION: Mild degenerative changes but no acute bony findings. Electronically Signed   By: Marijo Sanes M.D.   On: 04/20/2018 11:31   Vas [place] Burnard Bunting With/wo Tbi  Result Date: 04/20/2018 LOWER EXTREMITY DOPPLER STUDY Indications: Ulceration, and peripheral artery disease.  Performing Technologist: Birdena Crandall, [place] [facility]  Examination Guidelines: A complete evaluation includes at minimum, Doppler waveform  signals and systolic blood pressure reading at the level of bilateral brachial, anterior tibial, and posterior tibial arteries, when vessel segments are accessible. Bilateral testing is considered an integral part of a complete examination. Photoelectric Plethysmograph (PPG) waveforms and toe systolic pressure readings are included as required and additional duplex testing as needed. Limited examinations for reoccurring indications may be performed as noted.  ABI Findings: +---------+------------------+-----+---------+---------------------------------+ Right    Rt Pressure (mmHg)IndexWaveform Comment                           +---------+------------------+-----+---------+---------------------------------+ Brachial 180                    triphasic                                  +---------+------------------+-----+---------+---------------------------------+ PTA      246               1.28 biphasic                                   +---------+------------------+-----+---------+---------------------------------+ DP       245               1.28 biphasic                                   +---------+------------------+-----+---------+---------------------------------+ Great Toe                                Unable to obtain due to constant                                           involuntary movement of the foot  +---------+------------------+-----+---------+---------------------------------+  +---------+------------------+-----+----------+--------------------------------+ Left     Lt Pressure (mmHg)IndexWaveform  Comment                          +---------+------------------+-----+----------+--------------------------------+ Brachial 192                    triphasic                                  +---------+------------------+-----+----------+--------------------------------+ PTA      221               1.15 monophasic                                 +---------+------------------+-----+----------+--------------------------------+ DP       199               1.04 monophasic                                 +---------+------------------+-----+----------+--------------------------------+ Great Toe  Unable to obtan due to constan                                             involuntary movement of the foot +---------+------------------+-----+----------+--------------------------------+ +-------+-----------+--------------------------------+------------+------------+ ABI/TBIToday's ABIToday's TBI                     Previous ABIPrevious TBI +-------+-----------+--------------------------------+------------+------------+ Right  1.28       Unable to obtain see comment                                               above                                                    +-------+-----------+--------------------------------+------------+------------+ Left   1.15       Unable to obtain see comment                                               above                                                    +-------+-----------+--------------------------------+------------+------------+  Summary: Right: Resting right ankle-brachial index is within normal range. No evidence of significant right lower extremity arterial disease. Left: Resting left ankle-brachial index is within normal range. No evidence of significant left lower  extremity arterial disease.  *See table(s) above for measurements and observations.  Electronically signed by Curt Jews MD on 04/20/2018 at 3:09:22 PM.    Final    Dg Hip Unilat With Pelvis 2-3 Views Left  Result Date: 04/20/2018 CLINICAL DATA:  Left hip region pain EXAM: DG HIP (WITH OR WITHOUT PELVIS) 2-3V LEFT COMPARISON:  None. FINDINGS: Frontal pelvis as well as frontal and lateral left hip images were obtained. There is no acute fracture or dislocation. There is marked narrowing of the left hip joint. There are cystic changes in the left femoral head with areas of intermingled sclerosis and slight cortical irregularity. There is moderate narrowing of the right hip joint. Sacroiliac joints appear normal bilaterally. No bony destruction. There are soft tissue calcifications in the midline perineal region. 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 There is milder osteoarthritic change in the right hip joint. No fracture or dislocation. Small calcifications in the midline perineum, likely due to chronic inflammation. Electronically Signed   By: Lowella Grip III M.D.   On: 04/20/2018 10:02     LOS: 8 days   Oren Binet, MD  [facility]  If 7PM-7AM, please contact night-coverage  Please page via [email or web address]  Go to [email or web address] and use [facility] universal password to access. If you do not have the password, please contact the hospital operator.  Locate the [facility] provider you  are looking for under [facility] and page to a number that you can be directly reached. If you still have difficulty reaching the provider, please page the [facility] (Director on Call) for the Hospitalists listed on amion for assistance.  04/28/2018, 1:03 PM

## 2018-04-28 NOTE — Progress Notes (Addendum)
Foley removed this morning at 0745 per MD order. Patient due to void, has urinal at the bedside and will be assisted to the bathroom if needed. Will continue to monitor.   **Update 1700 patient voided 53ml into urinal post foley removal**

## 2018-04-28 NOTE — Progress Notes (Signed)
Physical Therapy Treatment Patient Details Name: Mark Ayala MRN: [id number] DOB: 17-May-1955 Today's Date: 04/28/2018    History of Present Illness 64 year old male with history of chronic hep C, hypertension, bipolar disorder, pulmonary hypertension, alcohol abuse admitted with alcohol intoxication with desire to detox.  Due to concern for SI was seen by psychiatry who recommended inpatient psychiatric admission when stable.  Underwent R second toe partial amputation on 04/25/2018 due to osteomyelitis.     PT Comments    Pt limited in treatment by fatigue and potential cognitive deficits. Pt declined since PT evaluation as evidenced by large decrease in ambulation tolerance and distance. Pt required max cuing to sequence LE during gait. Pt slow to follow commands and had difficulty isolating sides of the body for unilateral exercise progression. Pt left with suicide sitter, call bell and all needs within reach. Plan to progress pt with gait duration and BLE strengthening exercise.    Follow Up Recommendations  Other (comment)     Equipment Recommendations  Rolling walker with 5" wheels    Recommendations for Other Services       Precautions / Restrictions Precautions Precautions: Fall Required Braces or Orthoses: Other Brace Other Brace: Darco shoe on R Restrictions Weight Bearing Restrictions: Yes RLE Weight Bearing: Partial weight bearing Other Position/Activity Restrictions: heel weight bearing in Darco shoe    Mobility  Bed Mobility Overal bed mobility: Needs Assistance Bed Mobility: Supine to Sit     Supine to sit: Supervision;HOB elevated     General bed mobility comments: increased time with cuing needed to use BUE to help rise  Transfers Overall transfer level: Needs assistance Equipment used: Rolling walker (2 wheeled) Transfers: Sit to/from Stand Sit to Stand: Mod assist         General transfer comment: lifting help from EOB with cues for hand placement,  leaning back initially  Ambulation/Gait Ambulation/Gait assistance: Min assist +2 equipment Gait Distance (Feet): 15 Feet Assistive device: Rolling walker (2 wheeled) Gait Pattern/deviations: Step-to pattern;Trunk flexed;Wide base of support;Decreased stride length;Decreased step length - left;Decreased weight shift to right;Decreased stance time - right;Decreased dorsiflexion - left     General Gait Details: Pt only managed very small steps with the LLE. Unable to bring LLE to level of RLE. Pt dragging L foot with poor clearance. Pt fatigued after walking only 15 feet and requested a chair be brought for him to sit in.   Stairs             Wheelchair Mobility    Modified Rankin (Stroke Patients Only)       Balance Overall balance assessment: Needs assistance Sitting-balance support: Single extremity supported;Feet unsupported Sitting balance-Leahy Scale: Good     Standing balance support: Bilateral upper extremity supported;Single extremity supported Standing balance-Leahy Scale: Poor Standing balance comment: posterior bias with flexed upper trunk, min to mod A initially to help balance                            Cognition Arousal/Alertness: Awake/alert Behavior During Therapy: Impulsive Overall Cognitive Status: No family/caregiver present to determine baseline cognitive functioning                                 General Comments: Pt impulsive with slow processing and difficulty following commands. Pt required multiple VC/TC to execute seated therex activities. Pt difficulty sequencing single commands.  Exercises General Exercises - Lower Extremity Ankle Circles/Pumps: AROM;15 reps;Both;Seated Quad Sets: AROM;Seated;15 reps;Both Long Arc Quad: AROM;15 reps;Seated;Both Hip ABduction/ADduction: AROM;Seated;15 reps;Both Hip Flexion/Marching: AROM;Seated;15 reps;Both    General Comments        Pertinent Vitals/Pain Faces Pain  Scale: Hurts a little bit Pain Location: R toes Pain Descriptors / Indicators: Sore    Home Living Family/patient expects to be discharged to:: Other (Comment)               Additional Comments: plans for inpatient psych admission at d/c    Prior Function Level of Independence: Independent with assistive device(s)      Comments: used cane and has had multiple falls   PT Goals (current goals can now be found in the care plan section)      Frequency    Min 3X/week      PT Plan      Co-evaluation              AM-PAC PT "6 Clicks" Mobility   Outcome Measure  Help needed turning from your back to your side while in a flat bed without using bedrails?: None Help needed moving from lying on your back to sitting on the side of a flat bed without using bedrails?: A Little Help needed moving to and from a bed to a chair (including a wheelchair)?: A Little Help needed standing up from a chair using your arms (e.g., wheelchair or bedside chair)?: A Lot Help needed to walk in hospital room?: A Lot Help needed climbing 3-5 steps with a railing? : A Lot 6 Click Score: 16    End of Session Equipment Utilized During Treatment: Gait belt;Other (comment)(Darco Shoe) Activity Tolerance: Patient limited by fatigue;Other (comment)(Potential cognitive limitations ) Patient left: in chair;with call bell/phone within reach;with chair alarm set(w/ nursing/sitter in room)   PT Visit Diagnosis: Other abnormalities of gait and mobility (R26.89);History of falling (Z91.81)     Time: 2409-7353 PT Time Calculation (min) (ACUTE ONLY): 31 min  Charges:  $Gait Training: 8-22 mins $Therapeutic Activity: 8-22 mins                     Maryelizabeth Kaufmann, SPTA   Maryelizabeth Kaufmann 04/28/2018, 4:56 PM

## 2018-04-28 NOTE — Progress Notes (Signed)
CSW sent referral to [facility] for review. They will need a note in the chart once patient has voided.   Percell Locus Punam Broussard LCSW [phone]

## 2018-04-28 NOTE — Progress Notes (Signed)
Patient signed voluntary consent form for [facility]. They will keep following patient for when he voids.   Mark Locus Saydie Gerdts LCSW [phone]

## 2018-04-28 NOTE — Plan of Care (Signed)
Patient needs more education on the frequency and appropriate uses for pain meds. Explained to patient that PRN pain medications are given if patient has pain and not just because they want pain medication. Patient was agreeable to education, but need more reinforcement.  Problem: Safety: Goal: Ability to remain free from injury will improve Outcome: Progressing   Problem: Education: Goal: Knowledge of General Education information will improve Description Including pain rating scale, medication(s)/side effects and non-pharmacologic comfort measures Outcome: Progressing   Problem: Clinical Measurements: Goal: Ability to maintain clinical measurements within normal limits will improve Outcome: Progressing   Problem: Nutrition: Goal: Adequate nutrition will be maintained Outcome: Progressing   Problem: Elimination: Goal: Will not experience complications related to bowel motility Outcome: Progressing   Problem: Safety: Goal: Ability to remain free from injury will improve Outcome: Progressing   Problem: Health Behavior/Discharge Planning: Goal: Ability to manage health-related needs will improve Outcome: Not Progressing   Problem: Activity: Goal: Risk for activity intolerance will decrease Outcome: Not Progressing   Problem: Coping: Goal: Level of anxiety will decrease Outcome: Not Progressing   Problem: Pain Managment: Goal: General experience of comfort will improve Outcome: Not Progressing   Problem: Skin Integrity: Goal: Risk for impaired skin integrity will decrease Outcome: Not Progressing

## 2018-04-29 ENCOUNTER — Inpatient Hospital Stay (HOSPITAL_COMMUNITY): Admission: AD | Admit: 2018-04-29 | Payer: Medicaid Other | Source: Intra-hospital | Admitting: Psychiatry

## 2018-04-29 MED ORDER — ENSURE ENLIVE PO LIQD: 237.0000 mL | ORAL | 12 refills | Status: AC

## 2018-04-29 MED ORDER — AMLODIPINE BESYLATE 5 MG PO TABS: 5.0000 mg | ORAL_TABLET | Freq: Every day | ORAL | Status: AC

## 2018-04-29 MED ORDER — FOLIC ACID 1 MG PO TABS: 1.0000 mg | ORAL_TABLET | Freq: Every day | ORAL | Status: AC

## 2018-04-29 MED ORDER — TAMSULOSIN HCL 0.4 MG PO CAPS: 0.4000 mg | ORAL_CAPSULE | Freq: Every day | ORAL | Status: AC

## 2018-04-29 NOTE — Progress Notes (Addendum)
Tx performed and charted by SPTA under direct guidance and supervision of PTA at all times. This session was performed under the supervision of a licensed clinician.   Charting reviewed for accuracy and depicts tx performed and services provided.  Based on poor progression will inform supervising PT of need for updated recommendations.    Governor Rooks, PTA Acute Rehabilitation Services Pager [phone] Office [phone]     Physical Therapy Treatment Patient Details Name: Mark Ayala MRN: [id number] DOB: [birth year] Today's Date: 04/29/2018    History of Present Illness 63 year old male with history of chronic hep C, hypertension, bipolar disorder, pulmonary hypertension, alcohol abuse admitted with alcohol intoxication with desire to detox.  Due to concern for SI was seen by psychiatry who recommended inpatient psychiatric admission when stable.  Underwent R second toe partial amputation on 04/25/2018 due to osteomyelitis.     PT Comments    Pt agreed to perform gait training. Pt is minA w/ bed mobility and modA during transfers requiring clinician to help during LOB to avoid fall. Pt able to ambulate further as compared with previous session, but is limited by both cognitive and physical deficits which require assistance to insure that pt performs safely. Pt requires close behind chair follow during gait. Pt has difficulty following single commands. When told to bring his chest up, he raised up into maximal plantarflexion on the LLE. Based on pts lack of progress, a discharge plan to SNF is more appropriate at this time. Supervising PT has been updated on change of discharge plan and documentation updated as well. Plan to progress with gait training and therex to strengthen BLE.    Follow Up Recommendations  SNF vs. [facility] w/ 24 hour assistance/supervision   Equipment Recommendations  Rolling walker with 5" wheels    Recommendations for Other Services        Precautions / Restrictions Precautions Precautions: Fall Required Braces or Orthoses: Other Brace(Darco shoe) Other Brace: Darco shoe on R Restrictions Weight Bearing Restrictions: Yes RLE Weight Bearing: Partial weight bearing Other Position/Activity Restrictions: heel weight bearing in Darco shoe    Mobility  Bed Mobility Overal bed mobility: Needs Assistance Bed Mobility: Supine to Sit     Supine to sit: Min assist;HOB elevated     General bed mobility comments: Pt with increased difficulty performing bed mobility. Increased time with cuing needed to use BUE to help rise  Transfers Overall transfer level: Needs assistance Equipment used: Rolling walker (2 wheeled) Transfers: Sit to/from Stand Sit to Stand: Mod assist;From elevated surface         General transfer comment: Bed height elevated to make sit to stand easier. Upon rising, pt folded over and remained standing flexed forward at the hips and required max cues to stand up and avoid fall. Upon returning to sit in chair pt experienced LOB, pt could not safely locate chair arms to lower self down and required clinician aid to avoid fall to R side.    Ambulation/Gait Ambulation/Gait assistance: Min guard;Mod assist;+2 safety/equipment(for majority of gait pt required min guard. when turning to back up he experienced LOB requiring mod A to regain balance and avoid fall. ) Gait Distance (Feet): 120 Feet Assistive device: Rolling walker (2 wheeled) Gait Pattern/deviations: Step-to pattern;Trunk flexed;Wide base of support;Decreased stride length;Decreased step length - left;Decreased weight shift to right;Decreased stance time - right;Decreased dorsiflexion - left Gait velocity: extremely slow cadence   General Gait Details: Pt only managed very small steps with the LLE. Unable  to bring LLE to level of RLE. Pt able to pick up LLE at time and then would return to dragging L foot with poor clearance.    Stairs              Wheelchair Mobility    Modified Rankin (Stroke Patients Only)       Balance Overall balance assessment: Needs assistance   Sitting balance-Leahy Scale: Good     Standing balance support: Bilateral upper extremity supported;Single extremity supported Standing balance-Leahy Scale: Poor Standing balance comment: Pt with initial forward flex in standing w/ max cues/mod A for correction.                             Cognition Arousal/Alertness: Awake/alert Behavior During Therapy: Impulsive Overall Cognitive Status: No family/caregiver present to determine baseline cognitive functioning                                 General Comments: Pt impulsive with slow processing and difficulty following commands. Pt required multiple VC/TC to gait train and use LLE. Pt difficulty sequencing single and multiple commands.      Exercises      General Comments        Pertinent Vitals/Pain Faces Pain Scale: Hurts a little bit Pain Location: R toes Pain Descriptors / Indicators: Sore Pain Intervention(s): Monitored during session    Home Living                      Prior Function            PT Goals (current goals can now be found in the care plan section)      Frequency    Min 3X/week      PT Plan      Co-evaluation              AM-PAC PT "6 Clicks" Mobility   Outcome Measure  Help needed turning from your back to your side while in a flat bed without using bedrails?: None Help needed moving from lying on your back to sitting on the side of a flat bed without using bedrails?: A Little Help needed moving to and from a bed to a chair (including a wheelchair)?: A Lot Help needed standing up from a chair using your arms (e.g., wheelchair or bedside chair)?: A Lot Help needed to walk in hospital room?: A Lot Help needed climbing 3-5 steps with a railing? : A Lot 6 Click Score: 15    End of Session Equipment Utilized  During Treatment: Gait belt;Other (comment) Activity Tolerance: Patient limited by fatigue;Other (comment) Patient left: in chair;with call bell/phone within reach;with chair alarm set;Other (comment)(with suicide sitter present) Nurse Communication: Mobility status PT Visit Diagnosis: Other abnormalities of gait and mobility (R26.89);History of falling (Z91.81)     Time: 1020-1055 PT Time Calculation (min) (ACUTE ONLY): 35 min  Charges:  $Gait Training: 8-22 mins $Therapeutic Activity: 8-22 mins                     Maryelizabeth Kaufmann, SPTA   Maryelizabeth Kaufmann 04/29/2018, 2:13 PM

## 2018-04-29 NOTE — Plan of Care (Signed)
  Problem: Safety: Goal: Ability to remain free from injury will improve Outcome: Progressing   Problem: Education: Goal: Knowledge of General Education information will improve Description: Including pain rating scale, medication(s)/side effects and non-pharmacologic comfort measures Outcome: Progressing   Problem: Health Behavior/Discharge Planning: Goal: Ability to manage health-related needs will improve Outcome: Progressing   Problem: Clinical Measurements: Goal: Ability to maintain clinical measurements within normal limits will improve Outcome: Progressing Goal: Will remain free from infection Outcome: Progressing Goal: Diagnostic test results will improve Outcome: Progressing Goal: Respiratory complications will improve Outcome: Progressing Goal: Cardiovascular complication will be avoided Outcome: Progressing   Problem: Activity: Goal: Risk for activity intolerance will decrease Outcome: Progressing   Problem: Nutrition: Goal: Adequate nutrition will be maintained Outcome: Progressing   Problem: Coping: Goal: Level of anxiety will decrease Outcome: Progressing   Problem: Elimination: Goal: Will not experience complications related to bowel motility Outcome: Progressing Goal: Will not experience complications related to urinary retention Outcome: Progressing   Problem: Pain Managment: Goal: General experience of comfort will improve Outcome: Progressing   Problem: Safety: Goal: Ability to remain free from injury will improve Outcome: Progressing   Problem: Skin Integrity: Goal: Risk for impaired skin integrity will decrease Outcome: Progressing   

## 2018-04-29 NOTE — ED Provider Notes (Signed)
[facility] Provider Note   CSN: [id number] Arrival date & time: 04/19/18  2350    History   Chief Complaint Chief Complaint  Patient presents with  . Detox  . Suicidal    HPI Mark Ayala is a 63 y.o. male.     HPI  63 year old male with history of hepatitis, hypertension comes in with chief complaint of detox and toe pain.  Patient also has history of alcoholism.  Patient states that he wants to get detox.  His last drink was prior to ED arrival.  Additionally he is complaining of foot pain.  He states that there has been worsening of swelling and discomfort in his feet.  He denies any nausea, vomiting, fevers, chills.  Past Medical History:  Diagnosis Date  . Arthritis   . Depression   . GERD (gastroesophageal reflux disease)   . Hepatitis    Hepititis C  . Hypertension     Patient Active Problem List   Diagnosis Date Noted  . Leg wound, right, initial encounter 04/25/2018  . Pressure injury of skin 04/24/2018  . Toe osteomyelitis, right ([place])   . Suicidal ideation   . Hyponatremia 04/20/2018  . Alcohol abuse 04/20/2018  . ARF (acute renal failure) ([place]) 10/14/2017  . Elevated liver enzymes 10/14/2017  . GERD (gastroesophageal reflux disease) 10/14/2017  . Essential hypertension 10/14/2017  . Chronic hepatitis C without hepatic coma ([place]) 01/30/2014    Past Surgical History:  Procedure Laterality Date  . COLONOSCOPY WITH PROPOFOL N/A 02/10/2016   Procedure: COLONOSCOPY WITH PROPOFOL;  Surgeon: Clarene Essex, MD;  Location: [facility];  Service: Endoscopy;  Laterality: N/A;  . ESOPHAGOGASTRODUODENOSCOPY (EGD) WITH PROPOFOL N/A 02/10/2016   Procedure: ESOPHAGOGASTRODUODENOSCOPY (EGD) WITH PROPOFOL;  Surgeon: Clarene Essex, MD;  Location: [facility];  Service: Endoscopy;  Laterality: N/A;  . MINOR AMPUTATION OF DIGIT Right 04/25/2018   Procedure: PARTIAL  AMPUTATION OF RIGHT 2nd TOE;  Surgeon: Leandrew Koyanagi, MD;  Location: [facility];  Service:  Orthopedics;  Laterality: Right;  . WISDOM TOOTH EXTRACTION     upper right wisdom tooth        Home Medications    Prior to Admission medications   Medication Sig Start Date End Date Taking? Authorizing Provider  acetaminophen (TYLENOL) 325 MG tablet Take 2 tablets (650 mg total) by mouth every 6 (six) hours as needed. 12/13/17  Yes Horton, Barbette Hair, MD  citalopram (CELEXA) 40 MG tablet Take 0.5 tablets (20 mg total) by mouth daily for 30 days. 08/31/61 8/93/73 Yes Delora Fuel, MD  D3 SUPER STRENGTH 2000 units CAPS Take 2,000 Units by mouth daily. 12/09/15  Yes [provider]  FEROSUL 325 (65 Fe) MG tablet Take 325 mg by mouth 2 (two) times daily. 04/11/18  Yes [provider]  hydrochlorothiazide (HYDRODIURIL) 25 MG tablet Take 1 tablet (25 mg total) by mouth daily. 07/05/74  Yes Delora Fuel, MD  ibuprofen (ADVIL,MOTRIN) 600 MG tablet Take 1 tablet (600 mg total) by mouth 4 (four) times daily. 81/1/57  Yes Delora Fuel, MD  losartan (COZAAR) 100 MG tablet Take 1 tablet (100 mg total) by mouth daily. 2/62/03  Yes Delora Fuel, MD  potassium chloride SA (K-DUR,KLOR-CON) 20 MEQ tablet Take 1 tablet (20 mEq total) by mouth daily. 5/59/74  Yes Delora Fuel, MD  sulfamethoxazole-trimethoprim (BACTRIM DS,SEPTRA DS) 800-160 MG tablet Take 1 tablet by mouth 2 (two) times daily.   Yes [provider]  Tetrahydrozoline HCl (EYE DROPS OP) Apply  1 drop to eye daily as needed (red eyes).   Yes [provider]  thiamine 100 MG tablet Take 1 tablet (100 mg total) by mouth daily. 10/17/17  Yes Alma Friendly, MD  amLODipine (NORVASC) 5 MG tablet Take 1 tablet (5 mg total) by mouth daily. 04/30/18   Ghimire, Henreitta Leber, MD  feeding supplement, ENSURE ENLIVE, (ENSURE ENLIVE) LIQD Take 237 mLs by mouth daily. 04/29/18   Ghimire, Henreitta Leber, MD  folic acid (FOLVITE) 1 MG tablet Take 1 tablet (1 mg total) by mouth daily. 04/30/18   Ghimire, Henreitta Leber, MD  pantoprazole  (PROTONIX) 40 MG tablet Take 1 tablet (40 mg total) by mouth daily. 10/17/17 11/16/17  Alma Friendly, MD  tamsulosin (FLOMAX) 0.4 MG CAPS capsule Take 1 capsule (0.4 mg total) by mouth daily. 04/30/18   Ghimire, Henreitta Leber, MD    Family History History reviewed. No pertinent family history.  Social History Social History   Tobacco Use  . Smoking status: Current Every Day Smoker    Types: Cigars  . Smokeless tobacco: Never Used  Substance Use Topics  . Alcohol use: Yes    Comment: Admits to drinking earlier today   . Drug use: No    Comment: used in age 42's     Allergies   Codeine   Review of Systems Review of Systems  Constitutional: Positive for activity change.  Gastrointestinal: Positive for abdominal pain and nausea.  Skin: Positive for wound.  Psychiatric/Behavioral: Negative for suicidal ideas.  All other systems reviewed and are negative.    Physical Exam Updated Vital Signs BP 138/76   Pulse 96   Temp 99.5 F (37.5 C) (Oral)   Resp 18   Ht 6' (1.829 m)   Wt 89.6 kg   SpO2 93%   BMI 26.79 kg/m   Physical Exam Vitals signs and nursing note reviewed.  Constitutional:      Appearance: He is well-developed.  HENT:     Head: Atraumatic.  Neck:     Musculoskeletal: Neck supple.  Cardiovascular:     Rate and Rhythm: Tachycardia present.  Pulmonary:     Effort: Pulmonary effort is normal.  Abdominal:     Tenderness: There is no abdominal tenderness.  Musculoskeletal:        General: Swelling and tenderness present.  Skin:    General: Skin is warm.  Neurological:     Mental Status: He is alert and oriented to person, place, and time.      ED Treatments / Results  Labs (all labs ordered are listed, but only abnormal results are displayed) Labs Reviewed  COMPREHENSIVE METABOLIC PANEL - Abnormal; Notable for the following components:      Result Value   Sodium 119 (*)    Chloride 85 (*)    Glucose, Bld 100 (*)    Calcium 8.7 (*)     Albumin 3.0 (*)    AST 198 (*)    ALT 57 (*)    Total Bilirubin 2.5 (*)    All other components within normal limits  ETHANOL - Abnormal; Notable for the following components:   Alcohol, Ethyl (B) 83 (*)    All other components within normal limits  CBC - Abnormal; Notable for the following components:   RBC 2.91 (*)    Hemoglobin 10.0 (*)    HCT 29.2 (*)    MCV 100.3 (*)    MCH 34.4 (*)    All other components within normal limits  APTT - Abnormal; Notable for the following components:   aPTT 38 (*)    All other components within normal limits  OSMOLALITY, URINE - Abnormal; Notable for the following components:   Osmolality, Ur 208 (*)    All other components within normal limits  OSMOLALITY - Abnormal; Notable for the following components:   Osmolality 262 (*)    All other components within normal limits  BRAIN NATRIURETIC PEPTIDE - Abnormal; Notable for the following components:   B Natriuretic Peptide 108.5 (*)    All other components within normal limits  BASIC METABOLIC PANEL - Abnormal; Notable for the following components:   Sodium 118 (*)    Chloride 85 (*)    Calcium 8.4 (*)    All other components within normal limits  URINALYSIS, ROUTINE W REFLEX MICROSCOPIC - Abnormal; Notable for the following components:   Color, Urine STRAW (*)    Specific Gravity, Urine 1.004 (*)    Hgb urine dipstick MODERATE (*)    Bacteria, UA RARE (*)    All other components within normal limits  SEDIMENTATION RATE - Abnormal; Notable for the following components:   Sed Rate 60 (*)    All other components within normal limits  BASIC METABOLIC PANEL - Abnormal; Notable for the following components:   Sodium 121 (*)    Chloride 85 (*)    Glucose, Bld 104 (*)    Calcium 8.5 (*)    All other components within normal limits  HEMOGLOBIN A1C - Abnormal; Notable for the following components:   Hgb A1c MFr Bld 4.7 (*)    All other components within normal limits  HEPATITIS C VRS RNA  DETECT BY PCR-QUAL - Abnormal; Notable for the following components:   Hepatitis C Vrs RNA by PCR-Qual Positive (*)    All other components within normal limits  HEPATITIS PANEL, ACUTE - Abnormal; Notable for the following components:   HCV Ab >11.0 (*)    All other components within normal limits  COMPREHENSIVE METABOLIC PANEL - Abnormal; Notable for the following components:   Sodium 125 (*)    Chloride 90 (*)    Glucose, Bld 111 (*)    Calcium 8.5 (*)    Total Protein 6.2 (*)    Albumin 2.6 (*)    AST 165 (*)    ALT 50 (*)    Total Bilirubin 2.4 (*)    All other components within normal limits  CBC - Abnormal; Notable for the following components:   RBC 2.58 (*)    Hemoglobin 9.0 (*)    HCT 26.8 (*)    MCV 103.9 (*)    MCH 34.9 (*)    RDW 15.6 (*)    Platelets 145 (*)    All other components within normal limits  BASIC METABOLIC PANEL - Abnormal; Notable for the following components:   Sodium 122 (*)    Chloride 88 (*)    Glucose, Bld 107 (*)    Calcium 8.6 (*)    All other components within normal limits  BASIC METABOLIC PANEL - Abnormal; Notable for the following components:   Sodium 124 (*)    Chloride 89 (*)    Calcium 8.7 (*)    All other components within normal limits  BASIC METABOLIC PANEL - Abnormal; Notable for the following components:   Sodium 126 (*)    Chloride 91 (*)    Glucose, Bld 120 (*)    Calcium 8.5 (*)    All other components within  normal limits  CBC WITH DIFFERENTIAL/PLATELET - Abnormal; Notable for the following components:   RBC 2.54 (*)    Hemoglobin 8.7 (*)    HCT 26.4 (*)    MCV 103.9 (*)    MCH 34.3 (*)    RDW 15.6 (*)    All other components within normal limits  BASIC METABOLIC PANEL - Abnormal; Notable for the following components:   Sodium 127 (*)    Chloride 93 (*)    Glucose, Bld 105 (*)    Calcium 8.3 (*)    All other components within normal limits  RENAL FUNCTION PANEL - Abnormal; Notable for the following components:     Sodium 129 (*)    Calcium 8.1 (*)    Albumin 2.3 (*)    All other components within normal limits  CBC WITH DIFFERENTIAL/PLATELET - Abnormal; Notable for the following components:   RBC 2.48 (*)    Hemoglobin 8.7 (*)    HCT 27.2 (*)    MCV 109.7 (*)    MCH 35.1 (*)    RDW 15.9 (*)    All other components within normal limits  RENAL FUNCTION PANEL - Abnormal; Notable for the following components:   Sodium 128 (*)    Chloride 97 (*)    Calcium 7.8 (*)    Albumin 2.2 (*)    All other components within normal limits  CBC WITH DIFFERENTIAL/PLATELET - Abnormal; Notable for the following components:   RBC 2.49 (*)    Hemoglobin 8.6 (*)    HCT 26.3 (*)    MCV 105.6 (*)    MCH 34.5 (*)    RDW 15.7 (*)    Monocytes Absolute 1.2 (*)    All other components within normal limits  BASIC METABOLIC PANEL - Abnormal; Notable for the following components:   Sodium 133 (*)    Calcium 8.2 (*)    All other components within normal limits  CBC WITH DIFFERENTIAL/PLATELET - Abnormal; Notable for the following components:   RBC 2.64 (*)    Hemoglobin 9.1 (*)    HCT 28.0 (*)    MCV 106.1 (*)    MCH 34.5 (*)    All other components within normal limits  BASIC METABOLIC PANEL - Abnormal; Notable for the following components:   Sodium 133 (*)    Glucose, Bld 122 (*)    Calcium 8.6 (*)    All other components within normal limits  HEPATIC FUNCTION PANEL - Abnormal; Notable for the following components:   Total Protein 6.0 (*)    Albumin 2.1 (*)    AST 65 (*)    Bilirubin, Direct 0.4 (*)    All other components within normal limits  MRSA PCR SCREENING  SURGICAL PCR SCREEN  RAPID URINE DRUG SCREEN, HOSP PERFORMED  SALICYLATE LEVEL  ACETAMINOPHEN LEVEL  PROTIME-INR  SODIUM, URINE, RANDOM  UREA NITROGEN, URINE  C-REACTIVE PROTEIN  TSH  AMMONIA  MAGNESIUM  SURGICAL PATHOLOGY    EKG EKG Interpretation  Date/Time:  Wednesday April 20 2018 08:35:42 EST Ventricular Rate:  107 PR  Interval:    QRS Duration: 94 QT Interval:  358 QTC Calculation: 478 R Axis:   82 Text Interpretation:  Sinus tachycardia Borderline right axis deviation LVH by voltage Anterior Q waves, possibly due to LVH No significant change since 12/29/2017 Confirmed by Veryl Speak [phone]) on 04/22/2018 8:13:57 AM   Radiology No results found.  Procedures Procedures (including critical care time)  Medications Ordered in ED Medications  nicotine (  NICODERM CQ - dosed in mg/24 hr) patch 7 mg ( Transdermal MAR Unhold 04/25/18 1356)  losartan (COZAAR) tablet 100 mg (100 mg Oral Given 04/29/18 0917)  pantoprazole (PROTONIX) EC tablet 40 mg (40 mg Oral Given 04/29/18 0917)  acetaminophen (TYLENOL) tablet 650 mg (650 mg Oral Given 04/29/18 0917)    Or  acetaminophen (TYLENOL) suppository 650 mg ( Rectal See Alternative 04/29/18 0917)  polyethylene glycol (MIRALAX / GLYCOLAX) packet 17 g ( Oral MAR Unhold 04/25/18 1356)  Influenza vac split quadrivalent PF (FLUARIX) injection 0.5 mL ( Intramuscular MAR Unhold 04/25/18 1356)  labetalol (NORMODYNE,TRANDATE) injection 10 mg ( Intravenous MAR Unhold 04/25/18 1356)  amLODipine (NORVASC) tablet 5 mg (5 mg Oral Given 04/29/18 0917)  feeding supplement (ENSURE ENLIVE) (ENSURE ENLIVE) liquid 237 mL (237 mLs Oral Given 04/28/18 1450)  MEDLINE mouth rinse (15 mLs Mouth Rinse Given 04/29/18 0918)  0.9 %  sodium chloride infusion ( Intravenous MAR Unhold 04/25/18 1356)  multivitamin with minerals tablet 1 tablet (1 tablet Oral Given 04/29/18 0917)  ondansetron (ZOFRAN-ODT) disintegrating tablet 4 mg ( Oral MAR Unhold 04/25/18 1356)  chlordiazePOXIDE (LIBRIUM) capsule 25 mg (25 mg Oral Not Given 04/24/18 1607)    Followed by  chlordiazePOXIDE (LIBRIUM) capsule 25 mg (25 mg Oral Given 04/25/18 1650)    Followed by  chlordiazePOXIDE (LIBRIUM) capsule 25 mg (25 mg Oral Given 04/26/18 0931)    Followed by  chlordiazePOXIDE (LIBRIUM) capsule 25 mg (25 mg Oral Given 5/85/27 7824)    folic acid (FOLVITE) tablet 1 mg (1 mg Oral Given 04/29/18 0918)  thiamine (VITAMIN B-1) tablet 500 mg (500 mg Oral Given 04/29/18 0917)  fentaNYL (SUBLIMAZE) 100 MCG/2ML injection (has no administration in time range)  enoxaparin (LOVENOX) injection 40 mg (40 mg Subcutaneous Given 04/28/18 1449)  citalopram (CELEXA) tablet 20 mg (20 mg Oral Given 04/29/18 0918)  traMADol (ULTRAM) tablet 50 mg (50 mg Oral Given 04/29/18 0534)  tamsulosin (FLOMAX) capsule 0.4 mg (0.4 mg Oral Given 04/29/18 0917)  thiamine (VITAMIN B-1) tablet 100 mg (100 mg Oral Given 04/20/18 0802)  ibuprofen (ADVIL,MOTRIN) tablet 400 mg (400 mg Oral Given 04/20/18 0802)  furosemide (LASIX) injection 20 mg (20 mg Intravenous Given 04/20/18 0803)  losartan (COZAAR) tablet 100 mg (100 mg Oral Given 04/20/18 1931)  LORazepam (ATIVAN) injection 2 mg (2 mg Intravenous Given 04/21/18 1830)  chlordiazePOXIDE (LIBRIUM) capsule 25 mg (25 mg Oral Given 04/21/18 2215)  vancomycin (VANCOCIN) 1,750 mg in sodium chloride 0.9 % 500 mL IVPB (0 mg Intravenous Stopped 04/22/18 1047)  thiamine (B-1) injection 100 mg (100 mg Intramuscular Given 04/24/18 0829)  chlordiazePOXIDE (LIBRIUM) capsule 25 mg (25 mg Oral Given 04/26/18 1429)     Initial Impression / Assessment and Plan / ED Course  I have reviewed the triage vital signs and the nursing notes.  Pertinent labs & imaging results that were available during my care of the patient were reviewed by me and considered in my medical decision making (see chart for details).        Patient comes in with chief complaint of detox and toe pain. Patient is noted to have an open wound in his toe, with surrounding erythema and edema.  He will need admission for work-up for deep space infection of his lower extremity.  Differential diagnosis includes osteomyelitis along with abscess.  Medicine to admit.  He is also requesting detox.  His last alcoholic beverage was prior to ED arrival, is not acutely withdrawing.   Hospitalist team has been  made aware of his history of alcoholism.  Patient is clinically not septic.  Final Clinical Impressions(s) / ED Diagnoses   Final diagnoses:  Pain  Open toe wound  Toe osteomyelitis, right [facility])    ED Discharge Orders         Ordered    tamsulosin (FLOMAX) 0.4 MG CAPS capsule  Daily     76/28/31 5176    folic acid (FOLVITE) 1 MG tablet  Daily     04/29/18 0935    amLODipine (NORVASC) 5 MG tablet  Daily     04/29/18 0935    feeding supplement, ENSURE ENLIVE, (ENSURE ENLIVE) LIQD  Every 24 hours     04/29/18 0935    Increase activity slowly  Status:  Canceled     04/29/18 0935    Diet - low sodium heart healthy     04/29/18 0935    Discharge instructions    Comments:  Follow with Primary MD  [name], MD in 1 week  Please get a complete blood count and chemistry panel checked by your Primary MD at your next visit, and again as instructed by your Primary MD.  Get Medicines reviewed and adjusted: Please take all your medications with you for your next visit with your Primary MD  Laboratory/radiological data: Please request your Primary MD to go over all hospital tests and procedure/radiological results at the follow up, please ask your Primary MD to get all Hospital records sent to his/her office.  In some cases, they will be blood work, cultures and biopsy results pending at the time of your discharge. Please request that your primary care M.D. follows up on these results.  Also Note the following: If you experience worsening of your admission symptoms, develop shortness of breath, life threatening emergency, suicidal or homicidal thoughts you must seek medical attention immediately by calling 911 or calling your MD immediately  if symptoms less severe.  You must read complete instructions/literature along with all the possible adverse reactions/side effects for all the Medicines you take and that have been prescribed to you. Take any new Medicines  after you have completely understood and accpet all the possible adverse reactions/side effects.   Do not drive when taking Pain medications or sleeping medications (Benzodaizepines)  Do not take more than prescribed Pain, Sleep and Anxiety Medications. It is not advisable to combine anxiety,sleep and pain medications without talking with your primary care practitioner  Special Instructions: If you have smoked or chewed Tobacco  in the last 2 yrs please stop smoking, stop any regular Alcohol  and or any Recreational drug use.  Wear Seat belts while driving.  Please note: You were cared for by a hospitalist during your hospital stay. Once you are discharged, your primary care physician will handle any further medical issues. Please note that NO REFILLS for any discharge medications will be authorized once you are discharged, as it is imperative that you return to your primary care physician (or establish a relationship with a primary care physician if you do not have one) for your post hospital discharge needs so that they can reassess your need for medications and monitor your lab values.   04/29/18 0935    Discharge wound care:     04/29/18 0940    Increase activity slowly     04/29/18 0945           Varney Biles, MD 04/29/18 1158

## 2018-04-29 NOTE — Progress Notes (Signed)
Awake, alert-voided multiple times last night and this morning. Stable to discharge to [facility] if bed available. Full note to follow.

## 2018-04-29 NOTE — Discharge Summary (Addendum)
PATIENT DETAILS Name: Kolbey Teichert Age: 63 y.o. Sex: male Date of Birth: 10/11/1955 MRN: [id number]. Admitting Physician: Elodia Florence., MD ELF:YBOF, [name], MD  Admit Date: 04/20/2018 Discharge date: 05/17/2018  Recommendations for Outpatient Follow-up:  1. Follow up with PCP in 1-2 weeks 2. Please obtain BMP/CBC in one week 3. Please ensure follow-up with orthopedics.  Admitted From:  Home  Disposition: [place]: Yes  Equipment/Devices: None  Discharge Condition: Stable  CODE STATUS: FULL COD  Diet recommendation:  Heart Healthy   Brief Summary: See H&P, Labs, Consult and Test reports for all details in brief, Patient is a 63 y.o. male with history of alcohol use, recurrent ED visits for falls secondary to alcohol intoxication, bipolar disorder, chronic hepatitis C-presented with hyponatremia, alcohol withdrawal symptoms.  He then exhibited suicidal ideation, evaluated by psychiatry with recommendations to transfer to inpatient psychiatry when medically stable.  See below for further details  Brief Hospital Course:  Right second toe osteomyelitis: Underwent right second toe amputation on 2/17, all antimicrobial therapy was discontinued on 2/18.  Orthopedics recommending heel weightbearing in Darco shoe to right lower extremity, wet-to-dry dressing changes twice daily. Sutures removed by Ortho on 05/17/18  Will need follow-up with orthopedics in 1 week for suture removal.  Has completed a course of antimicrobial therapy and does not require any further antibiotics on discharge.  Alcohol withdrawal: Improved, and has completed a course of Librium taper.   Alcohol hepatitis: LFTs has markedly improved-only minimal elevation in the transaminases at this point.  Follow periodically.  Acute urinary retention: Continue Flomax-Foley catheter discontinued on 2/20-patient now voiding without any issues.    Hypertension: Controlled-continue Cozaar and  amlodipine.    Right lower extremity with scattered ulcerations: Present prior to admission-continue wound care per wound care team (see below).  Hyponatremia: Improved-follow periodically.  Suicidal ideation: Evaluated by psychiatry service-and cleared for discharge, needs outpatient psych follow-up, he has been placed on low-dose Seroquel twice daily for mild agitation and he has been stable on that.  Procedures/Studies: 2/17>> 1.Amputation of right second toe through PIP joint 2. Adjacent tissue rearrangement right second toe 2 cm 3. Irrigation debridement of subcutaneous tissue 2 cm  Discharge Diagnoses:  Principal Problem:   Alcohol use disorder, severe, dependence (HCC) Active Problems:   Chronic hepatitis C without hepatic coma (HCC)   Elevated liver enzymes   Hyponatremia   Alcohol abuse   Suicidal ideation   Toe osteomyelitis, right (HCC)   Pressure injury of skin   Leg wound, right, initial encounter   Discharge Instructions:  Activity:  Heel weight bearing in darco shoe RLE   Discharge Instructions    Diet - low sodium heart healthy   Complete by:  As directed    Discharge instructions   Complete by:  As directed    Follow with Primary MD  [name], MD in 1 week  Please get a complete blood count and chemistry panel checked by your Primary MD at your next visit, and again as instructed by your Primary MD.  Get Medicines reviewed and adjusted: Please take all your medications with you for your next visit with your Primary MD  Laboratory/radiological data: Please request your Primary MD to go over all hospital tests and procedure/radiological results at the follow up, please ask your Primary MD to get all Hospital records sent to his/her office.  In some cases, they will be blood work, cultures and biopsy results pending at the  time of your discharge. Please request that your primary care M.D. follows up on these results.  Also Note the  following: If you experience worsening of your admission symptoms, develop shortness of breath, life threatening emergency, suicidal or homicidal thoughts you must seek medical attention immediately by calling 911 or calling your MD immediately  if symptoms less severe.  You must read complete instructions/literature along with all the possible adverse reactions/side effects for all the Medicines you take and that have been prescribed to you. Take any new Medicines after you have completely understood and accpet all the possible adverse reactions/side effects.   Do not drive when taking Pain medications or sleeping medications (Benzodaizepines)  Do not take more than prescribed Pain, Sleep and Anxiety Medications. It is not advisable to combine anxiety,sleep and pain medications without talking with your primary care practitioner  Special Instructions: If you have smoked or chewed Tobacco  in the last 2 yrs please stop smoking, stop any regular Alcohol  and or any Recreational drug use.  Wear Seat belts while driving.  Please note: You were cared for by a hospitalist during your hospital stay. Once you are discharged, your primary care physician will handle any further medical issues. Please note that NO REFILLS for any discharge medications will be authorized once you are discharged, as it is imperative that you return to your primary care physician (or establish a relationship with a primary care physician if you do not have one) for your post hospital discharge needs so that they can reassess your need for medications and monitor your lab values.   Discharge wound care:   Complete by:  As directed    Cleanse wounds on RLE with soap and water. Pat dry. Cover with small foam dressings. Change daily   Increase activity slowly   Complete by:  As directed    Heel weight bearing in darco shoe RLE     Allergies as of 05/17/2018      Reactions   Codeine Other (See Comments)   Caused Hyperactivity       Medication List    STOP taking these medications   hydrochlorothiazide 25 MG tablet Commonly known as:  HYDRODIURIL   ibuprofen 600 MG tablet Commonly known as:  ADVIL,MOTRIN   potassium chloride SA 20 MEQ tablet Commonly known as:  K-DUR,KLOR-CON   sulfamethoxazole-trimethoprim 800-160 MG tablet Commonly known as:  BACTRIM DS,SEPTRA DS     TAKE these medications   acetaminophen 325 MG tablet Commonly known as:  Tylenol Take 2 tablets (650 mg total) by mouth every 6 (six) hours as needed.   amLODipine 5 MG tablet Commonly known as:  NORVASC Take 1 tablet (5 mg total) by mouth daily.   citalopram 40 MG tablet Commonly known as:  CELEXA Take 0.5 tablets (20 mg total) by mouth daily for 30 days.   D3 Super Strength 50 MCG (2000 UT) Caps Generic drug:  Cholecalciferol Take 2,000 Units by mouth daily.   EYE DROPS OP Apply 1 drop to eye daily as needed (red eyes).   feeding supplement (ENSURE ENLIVE) Liqd Take 237 mLs by mouth daily.   feeding supplement (ENSURE ENLIVE) Liqd Take 237 mLs by mouth 2 (two) times daily between meals.   FeroSul 325 (65 FE) MG tablet Generic drug:  ferrous sulfate Take 325 mg by mouth 2 (two) times daily.   folic acid 1 MG tablet Commonly known as:  FOLVITE Take 1 tablet (1 mg total) by mouth daily.  lidocaine 5 % Commonly known as:  LIDODERM Place 1 patch onto the skin daily. Remove & Discard patch within 12 hours or as directed by MD   losartan 100 MG tablet Commonly known as:  COZAAR Take 1 tablet (100 mg total) by mouth daily.   oxyCODONE 5 MG immediate release tablet Commonly known as:  Oxy IR/ROXICODONE Take 1 tablet (5 mg total) by mouth every 4 (four) hours as needed for moderate pain.   pantoprazole 40 MG tablet Commonly known as:  PROTONIX Take 1 tablet (40 mg total) by mouth daily.   QUEtiapine 50 MG tablet Commonly known as:  SEROQUEL Take 1 tablet (50 mg total) by mouth 2 (two) times daily.   tamsulosin  0.4 MG Caps capsule Commonly known as:  FLOMAX Take 1 capsule (0.4 mg total) by mouth daily.   thiamine 100 MG tablet Take 1 tablet (100 mg total) by mouth daily.            Discharge Care Instructions  (From admission, onward)         Start     Ordered   04/29/18 0000  Discharge wound care:     04/29/18 0940         Follow-up Information    Leandrew Koyanagi, MD In 1 week.   Specialty:  Orthopedic Surgery Why:  For wound re-check Contact information: [place] [address] [phone]        [name], MD. Schedule an appointment as soon as possible for a visit in 1 week(s).   Specialty:  Internal Medicine Contact information: [place] [address] [phone]          Allergies  Allergen Reactions  . Codeine Other (See Comments)    Caused Hyperactivity    Consultations:   orthopedic surgery  Other Procedures/Studies: Mr Toes Right Wo Contrast  Result Date: 04/21/2018 CLINICAL DATA:  Right second toe redness, pain and swelling. Skin ulcerations. EXAM: MRI OF THE RIGHT TOES WITHOUT CONTRAST TECHNIQUE: Multiplanar, multisequence MR imaging of the right foot was performed. No intravenous contrast was administered. COMPARISON:  Plain films right foot 04/20/2018. FINDINGS: Patient motion degrades the study. Bones/Joint/Cartilage There is marrow edema in the distal phalanx of the second toe worrisome for osteomyelitis. Hallux valgus is noted. The patient has some osteoarthritic change about the first and second MTP joints. Subchondral edema in the head of the second metatarsal is noted. Ligaments Appear intact. Muscles and Tendons There is some atrophy of intrinsic musculature of the foot. No intramuscular fluid collection. Soft tissues Subcutaneous edema is seen over the dorsum of the foot. No joint effusion or abscess. IMPRESSION: Motion degraded examination demonstrates marrow edema in the distal phalanx  of the second toe most consistent with osteomyelitis. Negative for abscess, myositis or septic joint. 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 Hallux valgus. Electronically Signed   By: Inge Rise M.D.   On: 04/21/2018 16:00   Dg Foot Complete Right  Result Date: 04/20/2018 CLINICAL DATA:  Swollen right foot.  No specific injury. EXAM: RIGHT FOOT COMPLETE - 3+ VIEW COMPARISON:  None. FINDINGS: Diffuse soft tissue swelling is noted, mainly along the dorsum of the foot. There are mild degenerative changes but no acute bony findings or destructive bony changes. Degenerative changes at the first MTP joint with hallux valgus deformity. Small calcaneal heel spur. IMPRESSION: Mild degenerative changes but no acute bony findings. Electronically Signed   By:  Marijo Sanes M.D.   On: 04/20/2018 11:31   Vas [place] Burnard Bunting With/wo Tbi  Result Date: 04/20/2018 LOWER EXTREMITY DOPPLER STUDY Indications: Ulceration, and peripheral artery disease.  Performing Technologist: Birdena Crandall, [place] [employer]  Examination Guidelines: A complete evaluation includes at minimum, Doppler waveform signals and systolic blood pressure reading at the level of bilateral brachial, anterior tibial, and posterior tibial arteries, when vessel segments are accessible. Bilateral testing is considered an integral part of a complete examination. Photoelectric Plethysmograph (PPG) waveforms and toe systolic pressure readings are included as required and additional duplex testing as needed. Limited examinations for reoccurring indications may be performed as noted.  ABI Findings: +---------+------------------+-----+---------+---------------------------------+ Right    Rt Pressure (mmHg)IndexWaveform Comment                           +---------+------------------+-----+---------+---------------------------------+ Brachial 180                    triphasic                                   +---------+------------------+-----+---------+---------------------------------+ PTA      246               1.28 biphasic                                   +---------+------------------+-----+---------+---------------------------------+ DP       245               1.28 biphasic                                   +---------+------------------+-----+---------+---------------------------------+ Great Toe                                Unable to obtain due to constant                                           involuntary movement of the foot  +---------+------------------+-----+---------+---------------------------------+ +---------+------------------+-----+----------+--------------------------------+ Left     Lt Pressure (mmHg)IndexWaveform  Comment                          +---------+------------------+-----+----------+--------------------------------+ Brachial 192                    triphasic                                  +---------+------------------+-----+----------+--------------------------------+ PTA      221               1.15 monophasic                                 +---------+------------------+-----+----------+--------------------------------+ DP       199               1.04 monophasic                                 +---------+------------------+-----+----------+--------------------------------+  Great Toe                                 Unable to obtan due to constan                                             involuntary movement of the foot +---------+------------------+-----+----------+--------------------------------+ +-------+-----------+--------------------------------+------------+------------+ ABI/TBIToday's ABIToday's TBI                     Previous ABIPrevious TBI +-------+-----------+--------------------------------+------------+------------+ Right  1.28       Unable to obtain see comment                                                above                                                    +-------+-----------+--------------------------------+------------+------------+ Left   1.15       Unable to obtain see comment                                               above                                                    +-------+-----------+--------------------------------+------------+------------+  Summary: Right: Resting right ankle-brachial index is within normal range. No evidence of significant right lower extremity arterial disease. Left: Resting left ankle-brachial index is within normal range. No evidence of significant left lower extremity arterial disease.  *See table(s) above for measurements and observations.  Electronically signed by Curt Jews MD on 04/20/2018 at 3:09:22 PM.    Final    Dg Hip Unilat With Pelvis 2-3 Views Left  Result Date: 04/20/2018 CLINICAL DATA:  Left hip region pain EXAM: DG HIP (WITH OR WITHOUT PELVIS) 2-3V LEFT COMPARISON:  None. FINDINGS: Frontal pelvis as well as frontal and lateral left hip images were obtained. There is no acute fracture or dislocation. There is marked narrowing of the left hip joint. There are cystic changes in the left femoral head with areas of intermingled sclerosis and slight cortical irregularity. There is moderate narrowing of the right hip joint. Sacroiliac joints appear normal bilaterally. No bony destruction. There are soft tissue calcifications in the midline perineal region. 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 There is milder osteoarthritic change in the right hip joint. No fracture or dislocation. Small calcifications in the midline perineum, likely due to chronic inflammation. Electronically Signed   By: Lowella Grip III M.D.   On: 04/20/2018 10:02     TODAY-DAY OF DISCHARGE:  Subjective:   Jaedon Siler today has no headache,no  chest abdominal pain,no new  weakness tingling or numbness, feels much better wants to go home today.   Objective:   Blood pressure 122/70, pulse 92, temperature 98.9 F (37.2 C), resp. rate 18, height 6' (1.829 m), weight 72.9 kg, SpO2 97 %.  Intake/Output Summary (Last 24 hours) at 05/17/2018 1455 Last data filed at 05/17/2018 0600 Gross per 24 hour  Intake 720 ml  Output 1000 ml  Net -280 ml   Filed Weights   05/16/18 0300 05/16/18 0600 05/17/18 0543  Weight: 72.9 kg 72.9 kg 72.9 kg    Exam:  Awake Alert,  No new F.N deficits, Normal affect [place].AT,PERRAL Supple Neck,No JVD, No cervical lymphadenopathy appriciated.  Symmetrical Chest wall movement, Good air movement bilaterally, CTAB RRR,No Gallops, Rubs or new Murmurs, No Parasternal Heave +ve B.Sounds, Abd Soft, No tenderness, No organomegaly appriciated, No rebound - guarding or rigidity. No Cyanosis, Clubbing or edema,  right second toe amputated site under bandage appears clean no surrounding cellulitis.   PERTINENT RADIOLOGIC STUDIES: Mr Toes Right Wo Contrast  Result Date: 04/21/2018 CLINICAL DATA:  Right second toe redness, pain and swelling. Skin ulcerations. EXAM: MRI OF THE RIGHT TOES WITHOUT CONTRAST TECHNIQUE: Multiplanar, multisequence MR imaging of the right foot was performed. No intravenous contrast was administered. COMPARISON:  Plain films right foot 04/20/2018. FINDINGS: Patient motion degrades the study. Bones/Joint/Cartilage There is marrow edema in the distal phalanx of the second toe worrisome for osteomyelitis. Hallux valgus is noted. The patient has some osteoarthritic change about the first and second MTP joints. Subchondral edema in the head of the second metatarsal is noted. Ligaments Appear intact. Muscles and Tendons There is some atrophy of intrinsic musculature of the foot. No intramuscular fluid collection. Soft tissues Subcutaneous edema is seen over the dorsum of the foot. No joint effusion or  abscess. IMPRESSION: Motion degraded examination demonstrates marrow edema in the distal phalanx of the second toe most consistent with osteomyelitis. Negative for abscess, myositis or septic joint. 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 Hallux valgus. Electronically Signed   By: Inge Rise M.D.   On: 04/21/2018 16:00   Dg Foot Complete Right  Result Date: 04/20/2018 CLINICAL DATA:  Swollen right foot.  No specific injury. EXAM: RIGHT FOOT COMPLETE - 3+ VIEW COMPARISON:  None. FINDINGS: Diffuse soft tissue swelling is noted, mainly along the dorsum of the foot. There are mild degenerative changes but no acute bony findings or destructive bony changes. Degenerative changes at the first MTP joint with hallux valgus deformity. Small calcaneal heel spur. IMPRESSION: Mild degenerative changes but no acute bony findings. Electronically Signed   By: Marijo Sanes M.D.   On: 04/20/2018 11:31   Vas [place] Burnard Bunting With/wo Tbi  Result Date: 04/20/2018 LOWER EXTREMITY DOPPLER STUDY Indications: Ulceration, and peripheral artery disease.  Performing Technologist: Birdena Crandall, [place] [employer]  Examination Guidelines: A complete evaluation includes at minimum, Doppler waveform signals and systolic blood pressure reading at the level of bilateral brachial, anterior tibial, and posterior tibial arteries, when vessel segments are accessible. Bilateral testing is considered an integral part of a complete examination. Photoelectric Plethysmograph (PPG) waveforms and toe systolic pressure readings are included as required and additional duplex testing as needed. Limited examinations for reoccurring indications may be performed as noted.  ABI Findings: +---------+------------------+-----+---------+---------------------------------+ Right    Rt Pressure (mmHg)IndexWaveform Comment                           +---------+------------------+-----+---------+---------------------------------+ Brachial  180                    triphasic                                  +---------+------------------+-----+---------+---------------------------------+ PTA      246               1.28 biphasic                                   +---------+------------------+-----+---------+---------------------------------+ DP       245               1.28 biphasic                                   +---------+------------------+-----+---------+---------------------------------+ Great Toe                                Unable to obtain due to constant                                           involuntary movement of the foot  +---------+------------------+-----+---------+---------------------------------+ +---------+------------------+-----+----------+--------------------------------+ Left     Lt Pressure (mmHg)IndexWaveform  Comment                          +---------+------------------+-----+----------+--------------------------------+ Brachial 192                    triphasic                                  +---------+------------------+-----+----------+--------------------------------+ PTA      221               1.15 monophasic                                 +---------+------------------+-----+----------+--------------------------------+ DP       199               1.04 monophasic                                 +---------+------------------+-----+----------+--------------------------------+ Great Toe                                 Unable to obtan due to constan                                             involuntary movement of the foot +---------+------------------+-----+----------+--------------------------------+ +-------+-----------+--------------------------------+------------+------------+ ABI/TBIToday's ABIToday's TBI                     Previous ABIPrevious TBI +-------+-----------+--------------------------------+------------+------------+ Right  1.28        Unable to obtain see comment  above                                                    +-------+-----------+--------------------------------+------------+------------+ Left   1.15       Unable to obtain see comment                                               above                                                    +-------+-----------+--------------------------------+------------+------------+  Summary: Right: Resting right ankle-brachial index is within normal range. No evidence of significant right lower extremity arterial disease. Left: Resting left ankle-brachial index is within normal range. No evidence of significant left lower extremity arterial disease.  *See table(s) above for measurements and observations.  Electronically signed by Curt Jews MD on 04/20/2018 at 3:09:22 PM.    Final    Dg Hip Unilat With Pelvis 2-3 Views Left  Result Date: 04/20/2018 CLINICAL DATA:  Left hip region pain EXAM: DG HIP (WITH OR WITHOUT PELVIS) 2-3V LEFT COMPARISON:  None. FINDINGS: Frontal pelvis as well as frontal and lateral left hip images were obtained. There is no acute fracture or dislocation. There is marked narrowing of the left hip joint. There are cystic changes in the left femoral head with areas of intermingled sclerosis and slight cortical irregularity. There is moderate narrowing of the right hip joint. Sacroiliac joints appear normal bilaterally. No bony destruction. There are soft tissue calcifications in the midline perineal region. 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 There is milder osteoarthritic change in the right hip joint. No fracture or dislocation. Small calcifications in the midline perineum, likely due to chronic inflammation. Electronically Signed   By: Lowella Grip III M.D.   On: 04/20/2018 10:02      PERTINENT LAB RESULTS: CBC: Recent Labs    05/15/18 0803  WBC 7.2  HGB 12.4*  HCT 37.0*  PLT 166   CMET CMP     Component Value Date/Time   NA 135 05/15/2018 0803   K 4.7 05/15/2018 0803   CL 104 05/15/2018 0803   CO2 19 (L) 05/15/2018 0803   GLUCOSE 91 05/15/2018 0803   BUN 22 05/15/2018 0803   CREATININE 0.92 05/15/2018 0803   CREATININE 0.73 11/15/2013 1706   CALCIUM 9.7 05/15/2018 0803   PROT 6.0 (L) 04/30/2018 0844   ALBUMIN 2.2 (L) 04/30/2018 0844   AST 66 (H) 04/30/2018 0844   ALT 33 04/30/2018 0844   ALKPHOS 83 04/30/2018 0844   BILITOT 0.9 04/30/2018 0844   GFRNONAA >60 05/15/2018 0803   GFRAA >60 05/15/2018 0803    GFR Estimated Creatinine Clearance: 85.8 mL/min (by C-G formula based on SCr of 0.92 mg/dL). No results for input(s): LIPASE, AMYLASE in the last 72 hours. No results for input(s): CKTOTAL, CKMB, CKMBINDEX, TROPONINI in the last 72 hours. Invalid input(s): POCBNP No results for input(s): DDIMER in the last 72 hours. No  results for input(s): HGBA1C in the last 72 hours. No results for input(s): CHOL, HDL, LDLCALC, TRIG, CHOLHDL, LDLDIRECT in the last 72 hours. No results for input(s): TSH, T4TOTAL, T3FREE, THYROIDAB in the last 72 hours.  Invalid input(s): FREET3 No results for input(s): VITAMINB12, FOLATE, FERRITIN, TIBC, IRON, RETICCTPCT in the last 72 hours. Coags: Recent Labs    05/15/18 0803  INR 1.1   Microbiology: No results found for this or any previous visit (from the past 240 hour(s)).  FURTHER DISCHARGE INSTRUCTIONS:  Get Medicines reviewed and adjusted: Please take all your medications with you for your next visit with your Primary MD  Laboratory/radiological data: Please request your Primary MD to go over all hospital tests and procedure/radiological results at the follow up, please ask your Primary MD to get all Hospital records sent to his/her office.  In some cases, they will be blood work, cultures and biopsy  results pending at the time of your discharge. Please request that your primary care M.D. goes through all the records of your hospital data and follows up on these results.  Also Note the following: If you experience worsening of your admission symptoms, develop shortness of breath, life threatening emergency, suicidal or homicidal thoughts you must seek medical attention immediately by calling 911 or calling your MD immediately  if symptoms less severe.  You must read complete instructions/literature along with all the possible adverse reactions/side effects for all the Medicines you take and that have been prescribed to you. Take any new Medicines after you have completely understood and accpet all the possible adverse reactions/side effects.   Do not drive when taking Pain medications or sleeping medications (Benzodaizepines)  Do not take more than prescribed Pain, Sleep and Anxiety Medications. It is not advisable to combine anxiety,sleep and pain medications without talking with your primary care practitioner  Special Instructions: If you have smoked or chewed Tobacco  in the last 2 yrs please stop smoking, stop any regular Alcohol  and or any Recreational drug use.  Wear Seat belts while driving.  Please note: You were cared for by a hospitalist during your hospital stay. Once you are discharged, your primary care physician will handle any further medical issues. Please note that NO REFILLS for any discharge medications will be authorized once you are discharged, as it is imperative that you return to your primary care physician (or establish a relationship with a primary care physician if you do not have one) for your post hospital discharge needs so that they can reassess your need for medications and monitor your lab values.  Total Time spent coordinating discharge including counseling, education and face to face time equals 35 minutes.  Signed: Lala Lund 05/17/2018 2:55 PM

## 2018-04-29 NOTE — Progress Notes (Signed)
[facility] had a bed for patient, however, patient unable to independently navigate the halls and is a fall risk, per PT. CSW sent referral to [facility] and confirmed that they are reviewing referral.  Cedric Fishman LCSW [phone]

## 2018-04-30 LAB — COMPREHENSIVE METABOLIC PANEL
ALT: 33 U/L (ref 0–44)
AST: 66 U/L — ABNORMAL HIGH (ref 15–41)
Albumin: 2.2 g/dL — ABNORMAL LOW (ref 3.5–5.0)
Alkaline Phosphatase: 83 U/L (ref 38–126)
Anion gap: 6 (ref 5–15)
BUN: 17 mg/dL (ref 8–23)
CO2: 27 mmol/L (ref 22–32)
Calcium: 8.6 mg/dL — ABNORMAL LOW (ref 8.9–10.3)
Chloride: 103 mmol/L (ref 98–111)
Creatinine, Ser: 0.98 mg/dL (ref 0.61–1.24)
GFR calc Af Amer: >60 mL/min (ref >60)
GFR calc non Af Amer: >60 mL/min (ref >60)
Glucose, Bld: 101 mg/dL — ABNORMAL HIGH (ref 70–99)
Potassium: 4.2 mmol/L (ref 3.5–5.1)
Sodium: 136 mmol/L (ref 135–145)
Total Bilirubin: 0.9 mg/dL (ref 0.3–1.2)
Total Protein: 6 g/dL — ABNORMAL LOW (ref 6.5–8.1)

## 2018-04-30 NOTE — Clinical Social Work Note (Addendum)
CSW faxed [facility] referral paperwork with [facility]C and requested lab work.  CSW spoke with [facility], and they said intake worker will not be in till this evening, [phone].  CSW was informed by [facility], that they will review patient's information and contact weekend [place], [place], [phone].  CSW continuing to follow patient's progress throughout discharge planning.  Jones Broom. Amela Handley, MSW, LCSW  04/30/2018 12:54 PM

## 2018-04-30 NOTE — Progress Notes (Signed)
Seen and examined-lying comfortably in bed.  No major issues overnight-denies any chest pain or shortness of breath.  Vitals:  04/29/18 1403 04/30/18 0604 BP: 120/60 (!) 144/73 Pulse: 97 91 Resp: 16 18 Temp: (!) 97.5 F (36.4 C) 98.3 F (36.8 C) SpO2: 95% 97%   Awaiting bed at [facility] unit-stable to be transferred.  Discharge summary addended this morning.See discharge summary for further details

## 2018-04-30 NOTE — Progress Notes (Signed)
CSW placed IVC paperwork on chart.  [facility] was contacted to serve patient.  Paperwork will expire 7 days after patient has been served.  CSW will continue to follow patient for disposition planning.   Reed Breech LCSWA [phone]

## 2018-05-01 NOTE — Progress Notes (Signed)
CSW followed up with [facility].  Patient has been denied admission.  CSW will continue to follow for disposition needs.   Reed Breech LCSWA [phone]

## 2018-05-01 NOTE — Progress Notes (Signed)
PROGRESS NOTE        PATIENT DETAILS Name: Mark Ayala Age: 63 y.o. Sex: male Date of Birth: [birth year] Admit Date: 04/20/2018 Admitting Physician A Melven Sartorius., MD [id number], [name], MD  Brief Narrative: Patient is a 63 y.o. male with history of alcohol use, recurrent ED visits for falls secondary to alcohol intoxication, bipolar disorder, chronic hepatitis C-presented with hyponatremia, alcohol withdrawal symptoms.  He then exhibited suicidal ideation, evaluated by psychiatry with recommendations to transfer to inpatient psychiatry when medically stable.  See below for further details  Subjective: Denies any chest pain or shortness of breath.  Lying comfortably in bed.  Assessment/Plan: Right second toe osteomyelitis: Underwent right second toe amputation on 2/17, all antimicrobial therapy was discontinued on 2/18.  Orthopedics recommending heel weightbearing in Darco shoe to right lower extremity, wet-to-dry dressing changes twice daily.  Will need follow-up with orthopedics in 2 weeks for suture removal.  Alcohol withdrawal: Improved, and has completed a course of Librium taper.   Acute urinary retention: Continue Flomax-Foley catheter discontinued on 2/20-follow to see if patient can void.    Hypertension: Controlled-continue Cozaar and amlodipine.    Right lower extremity with scattered ulcerations: Present prior to admission-continue wound care per wound care team.  Hyponatremia: Improved-follow periodically.  Suicidal ideation: Evaluated by psychiatry service-awaiting transfer to inpatient psychiatry.  DVT Prophylaxis: Prophylactic Lovenox   Code Status: Full code   Family Communication: None at bedside  Disposition Plan: Remain inpatient  Antimicrobial agents: Anti-infectives (From admission, onward)   Start     Dose/Rate Route Frequency Ordered Stop   04/22/18 2100  vancomycin (VANCOCIN) 1,250 mg in sodium chloride 0.9 % 250 mL  IVPB  Status:  Discontinued     1,250 mg 166.7 mL/hr over 90 Minutes Intravenous Every 12 hours 04/22/18 0820 04/26/18 1011   04/22/18 0900  ceFEPIme (MAXIPIME) 2 g in sodium chloride 0.9 % 100 mL IVPB  Status:  Discontinued     2 g 200 mL/hr over 30 Minutes Intravenous Every 8 hours 04/22/18 0759 04/26/18 1011   04/22/18 0900  vancomycin (VANCOCIN) 1,750 mg in sodium chloride 0.9 % 500 mL IVPB     1,750 mg 250 mL/hr over 120 Minutes Intravenous  Once 04/22/18 0759 04/22/18 1047      Procedures: 2/17>> 1.  Amputation of right second toe through PIP joint 2.  Adjacent tissue rearrangement right second toe 2 cm 3.  Irrigation debridement of subcutaneous tissue 2 cm  CONSULTS:  orthopedic surgery  Time spent: 25- minutes-Greater than 50% of this time was spent in counseling, explanation of diagnosis, planning of further management, and coordination of care.  MEDICATIONS: Scheduled Meds: . amLODipine  5 mg Oral Daily  . citalopram  20 mg Oral Daily  . enoxaparin (LOVENOX) injection  40 mg Subcutaneous Q24H  . feeding supplement (ENSURE ENLIVE)  237 mL Oral Q24H  . folic acid  1 mg Oral Daily  . Influenza vac split quadrivalent PF  0.5 mL Intramuscular Tomorrow-1000  . losartan  100 mg Oral Daily  . mouth rinse  15 mL Mouth Rinse BID  . multivitamin with minerals  1 tablet Oral Daily  . pantoprazole  40 mg Oral Daily  . tamsulosin  0.4 mg Oral Daily   Continuous Infusions: . sodium chloride Stopped (04/22/18 0847)   PRN Meds:.sodium chloride, acetaminophen **OR** acetaminophen,  labetalol, nicotine, polyethylene glycol, traMADol   PHYSICAL EXAM: Vital signs: Vitals:   04/30/18 1338 04/30/18 2253 05/01/18 0628 05/01/18 1005  BP: 131/70 (!) 145/77 134/75 136/71  Pulse: 95 93 92 89  Resp: 18 19 18    Temp: 98.4 F (36.9 C) 99.1 F (37.3 C) 98.1 F (36.7 C)   TempSrc: Oral Oral Oral   SpO2: 94% 97% 93%   Weight:      Height:       Filed Weights   04/26/18 0500  04/28/18 0603 04/30/18 0637  Weight: 83.7 kg 89.6 kg 85.5 kg   Body mass index is 25.56 kg/m.   General appearance:Awake, alert, not in any distress.  Eyes:no scleral icterus. HEENT: Atraumatic and Normocephalic Neck: supple, no JVD. Resp:Good air entry bilaterally,no rales or rhonchi CVS: S1 S2 regular, no murmurs.  GI: Bowel sounds present, Non tender and not distended with no gaurding, rigidity or rebound. Extremities: B/L Lower Ext shows no edema, both legs are warm to touch Neurology:  Non focal  I have personally reviewed following labs and imaging studies  LABORATORY DATA: CBC: Recent Labs  Lab 04/25/18 0521 04/26/18 0354  WBC 6.9 5.8  NEUTROABS 3.8 4.7  HGB 8.6* 9.1*  HCT 26.3* 28.0*  MCV 105.6* 106.1*  PLT 156 277    Basic Metabolic Panel: Recent Labs  Lab 04/25/18 0521 04/26/18 0354 04/30/18 0844  NA 133* 133* 136  K 4.5 4.6 4.2  CL 104 102 103  CO2 22 22 27   GLUCOSE 97 122* 101*  BUN 13 15 17   CREATININE 0.91 1.05 0.98  CALCIUM 8.2* 8.6* 8.6*  MG  --  1.9  --     GFR: Estimated Creatinine Clearance: 85.8 mL/min (by C-G formula based on SCr of 0.98 mg/dL).  Liver Function Tests: Recent Labs  Lab 04/28/18 0426 04/30/18 0844  AST 65* 66*  ALT 34 33  ALKPHOS 80 83  BILITOT 1.1 0.9  PROT 6.0* 6.0*  ALBUMIN 2.1* 2.2*   No results for input(s): LIPASE, AMYLASE in the last 168 hours. No results for input(s): AMMONIA in the last 168 hours.  Coagulation Profile: No results for input(s): INR, PROTIME in the last 168 hours.  Cardiac Enzymes: No results for input(s): CKTOTAL, CKMB, CKMBINDEX, TROPONINI in the last 168 hours.  BNP (last 3 results) No results for input(s): PROBNP in the last 8760 hours.  HbA1C: No results for input(s): HGBA1C in the last 72 hours.  CBG: No results for input(s): GLUCAP in the last 168 hours.  Lipid Profile: No results for input(s): CHOL, HDL, LDLCALC, TRIG, CHOLHDL, LDLDIRECT in the last 72  hours.  Thyroid Function Tests: No results for input(s): TSH, T4TOTAL, FREET4, T3FREE, THYROIDAB in the last 72 hours.  Anemia Panel: No results for input(s): VITAMINB12, FOLATE, FERRITIN, TIBC, IRON, RETICCTPCT in the last 72 hours.  Urine analysis:    Component Value Date/Time   COLORURINE STRAW (A) 04/20/2018 0850   APPEARANCEUR CLEAR 04/20/2018 0850   LABSPEC 1.004 (L) 04/20/2018 0850   PHURINE 6.0 04/20/2018 0850   GLUCOSEU NEGATIVE 04/20/2018 0850   HGBUR MODERATE (A) 04/20/2018 0850   BILIRUBINUR NEGATIVE 04/20/2018 0850   KETONESUR NEGATIVE 04/20/2018 0850   PROTEINUR NEGATIVE 04/20/2018 0850   NITRITE NEGATIVE 04/20/2018 0850   LEUKOCYTESUR NEGATIVE 04/20/2018 0850    Sepsis Labs: Lactic Acid, Venous No results found for: LATICACIDVEN  MICROBIOLOGY: Recent Results (from the past 240 hour(s))  Surgical pcr screen     Status: None  Collection Time: 04/25/18  1:54 AM  Result Value Ref Range Status   MRSA, PCR NEGATIVE NEGATIVE Final   Staphylococcus aureus NEGATIVE NEGATIVE Final    Comment: (NOTE) The Xpert SA Assay (FDA approved for NASAL specimens in patients 64 years of age and older), is one component of a comprehensive surveillance program. It is not intended to diagnose infection nor to guide or monitor treatment. Performed at [facility], [place] [address], [place], [place] [address]     RADIOLOGY STUDIES/RESULTS: Mr Dellie Catholic Right Wo Contrast  Result Date: 04/21/2018 CLINICAL DATA:  Right second toe redness, pain and swelling. Skin ulcerations. EXAM: MRI OF THE RIGHT TOES WITHOUT CONTRAST TECHNIQUE: Multiplanar, multisequence MR imaging of the right foot was performed. No intravenous contrast was administered. COMPARISON:  Plain films right foot 04/20/2018. FINDINGS: Patient motion degrades the study. Bones/Joint/Cartilage There is marrow edema in the distal phalanx of the second toe worrisome for osteomyelitis. Hallux valgus is noted. The patient has  some osteoarthritic change about the first and second MTP joints. Subchondral edema in the head of the second metatarsal is noted. Ligaments Appear intact. Muscles and Tendons There is some atrophy of intrinsic musculature of the foot. No intramuscular fluid collection. Soft tissues Subcutaneous edema is seen over the dorsum of the foot. No joint effusion or abscess. IMPRESSION: Motion degraded examination demonstrates marrow edema in the distal phalanx of the second toe most consistent with osteomyelitis. Negative for abscess, myositis or septic joint. 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 Hallux valgus. Electronically Signed   By: Inge Rise M.D.   On: 04/21/2018 16:00   Dg Foot Complete Right  Result Date: 04/20/2018 CLINICAL DATA:  Swollen right foot.  No specific injury. EXAM: RIGHT FOOT COMPLETE - 3+ VIEW COMPARISON:  None. FINDINGS: Diffuse soft tissue swelling is noted, mainly along the dorsum of the foot. There are mild degenerative changes but no acute bony findings or destructive bony changes. Degenerative changes at the first MTP joint with hallux valgus deformity. Small calcaneal heel spur. IMPRESSION: Mild degenerative changes but no acute bony findings. Electronically Signed   By: Marijo Sanes M.D.   On: 04/20/2018 11:31   Vas [place] Burnard Bunting With/wo Tbi  Result Date: 04/20/2018 LOWER EXTREMITY DOPPLER STUDY Indications: Ulceration, and peripheral artery disease.  Performing Technologist: Birdena Crandall, [place] [facility]  Examination Guidelines: A complete evaluation includes at minimum, Doppler waveform signals and systolic blood pressure reading at the level of bilateral brachial, anterior tibial, and posterior tibial arteries, when vessel segments are accessible. Bilateral testing is considered an integral part of a complete examination. Photoelectric Plethysmograph (PPG) waveforms and toe systolic pressure readings are included as required and additional duplex  testing as needed. Limited examinations for reoccurring indications may be performed as noted.  ABI Findings: +---------+------------------+-----+---------+---------------------------------+ Right    Rt Pressure (mmHg)IndexWaveform Comment                           +---------+------------------+-----+---------+---------------------------------+ Brachial 180                    triphasic                                  +---------+------------------+-----+---------+---------------------------------+ PTA      246               1.28 biphasic                                   +---------+------------------+-----+---------+---------------------------------+  DP       245               1.28 biphasic                                   +---------+------------------+-----+---------+---------------------------------+ Great Toe                                Unable to obtain due to constant                                           involuntary movement of the foot  +---------+------------------+-----+---------+---------------------------------+ +---------+------------------+-----+----------+--------------------------------+ Left     Lt Pressure (mmHg)IndexWaveform  Comment                          +---------+------------------+-----+----------+--------------------------------+ Brachial 192                    triphasic                                  +---------+------------------+-----+----------+--------------------------------+ PTA      221               1.15 monophasic                                 +---------+------------------+-----+----------+--------------------------------+ DP       199               1.04 monophasic                                 +---------+------------------+-----+----------+--------------------------------+ Great Toe                                 Unable to obtan due to constan                                              involuntary movement of the foot +---------+------------------+-----+----------+--------------------------------+ +-------+-----------+--------------------------------+------------+------------+ ABI/TBIToday's ABIToday's TBI                     Previous ABIPrevious TBI +-------+-----------+--------------------------------+------------+------------+ Right  1.28       Unable to obtain see comment                                               above                                                    +-------+-----------+--------------------------------+------------+------------+ Left   1.15       Unable to obtain see comment  above                                                    +-------+-----------+--------------------------------+------------+------------+  Summary: Right: Resting right ankle-brachial index is within normal range. No evidence of significant right lower extremity arterial disease. Left: Resting left ankle-brachial index is within normal range. No evidence of significant left lower extremity arterial disease.  *See table(s) above for measurements and observations.  Electronically signed by Curt Jews MD on 04/20/2018 at 3:09:22 PM.    Final    Dg Hip Unilat With Pelvis 2-3 Views Left  Result Date: 04/20/2018 CLINICAL DATA:  Left hip region pain EXAM: DG HIP (WITH OR WITHOUT PELVIS) 2-3V LEFT COMPARISON:  None. FINDINGS: Frontal pelvis as well as frontal and lateral left hip images were obtained. There is no acute fracture or dislocation. There is marked narrowing of the left hip joint. There are cystic changes in the left femoral head with areas of intermingled sclerosis and slight cortical irregularity. There is moderate narrowing of the right hip joint. Sacroiliac joints appear normal bilaterally. No bony destruction. There are soft tissue calcifications in the midline perineal region. 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 There is milder osteoarthritic change in the right hip joint. No fracture or dislocation. Small calcifications in the midline perineum, likely due to chronic inflammation. Electronically Signed   By: Lowella Grip III M.D.   On: 04/20/2018 10:02     LOS: 11 days   Oren Binet, MD  [facility]  If 7PM-7AM, please contact night-coverage  Please page via [email or web address]  Go to [email or web address] and use [facility] universal password to access. If you do not have the password, please contact the hospital operator.  Locate the [facility] provider you are looking for under [facility] and page to a number that you can be directly reached. If you still have difficulty reaching the provider, please page the [facility] (Director on Call) for the Hospitalists listed on amion for assistance.  05/01/2018, 12:42 PM

## 2018-05-02 DIAGNOSIS — F1020 Alcohol dependence, uncomplicated: Secondary | ICD-10-CM

## 2018-05-02 NOTE — Plan of Care (Signed)
  Problem: Safety: Goal: Ability to remain free from injury will improve Outcome: Progressing   Problem: Education: Goal: Knowledge of General Education information will improve Description Including pain rating scale, medication(s)/side effects and non-pharmacologic comfort measures Outcome: Progressing   Problem: Clinical Measurements: Goal: Ability to maintain clinical measurements within normal limits will improve Outcome: Progressing Goal: Will remain free from infection Outcome: Progressing Goal: Diagnostic test results will improve Outcome: Progressing Goal: Respiratory complications will improve Outcome: Progressing Goal: Cardiovascular complication will be avoided Outcome: Progressing   Problem: Health Behavior/Discharge Planning: Goal: Ability to manage health-related needs will improve Outcome: Progressing   Problem: Activity: Goal: Risk for activity intolerance will decrease Outcome: Progressing

## 2018-05-02 NOTE — Progress Notes (Signed)
Physical Therapy Treatment Patient Details Name: Mark Ayala MRN: [id number] DOB: 1955/04/27 Today's Date: 05/02/2018    History of Present Illness 63 year old male with history of chronic hep C, hypertension, bipolar disorder, pulmonary hypertension, alcohol abuse admitted with alcohol intoxication with desire to detox.  Due to concern for SI was seen by psychiatry who recommended inpatient psychiatric admission when stable.  Underwent R second toe partial amputation on 04/25/2018 due to osteomyelitis.     PT Comments    Pt instructed in and performed gait training and therex activity. Pt gait pattern has improved greatly since last treatment, though he continues to need max cueing to maintain weight bearing status. Pt tolerated treatment well. Pt had a continued cramp in his L hip flexor region when performing seated marching, but it went away each time he continued to exercise through it. Pt improved sit from stand execution and did not experience a LOB when sitting back in chair, though he did require cues to remember to find the chair rail and line himself up with the chair. Based on pt progress the current d/c plan of SNF remains appropriate at this time. Plan to progress gait with better adherence to his weight bearing status. Nursing contacted about acquiring shoe for LLE to help balance out gait weight distribution.   Follow Up Recommendations  Supervision/Assistance - 24 hour     Equipment Recommendations  Rolling walker with 5" wheels    Recommendations for Other Services       Precautions / Restrictions Precautions Precautions: Fall Required Braces or Orthoses: Other Brace Other Brace: Darco shoe on R Restrictions Weight Bearing Restrictions: Yes RLE Weight Bearing: Partial weight bearing Other Position/Activity Restrictions: heel weight bearing in Darco shoe    Mobility  Bed Mobility Overal bed mobility: Needs Assistance Bed Mobility: Supine to Sit     Supine to sit:  Min assist;HOB elevated     General bed mobility comments: Increased time with cuing needed to use BUE to help rise.  Transfers Overall transfer level: Needs assistance Equipment used: Rolling walker (2 wheeled) Transfers: Sit to/from Stand Sit to Stand: Mod assist;From elevated surface         General transfer comment: Bed height elevated to make sit to stand easier. Upon rising, pt folded over and remained standing flexed forward at the hips and required max cues to stand up and avoid fall.  Ambulation/Gait Ambulation/Gait assistance: Min assist Gait Distance (Feet): 110 Feet(+10 to the toilet) Assistive device: Rolling walker (2 wheeled) Gait Pattern/deviations: Step-to pattern;Step-through pattern;Decreased dorsiflexion - right;Trunk flexed Gait velocity: increased   General Gait Details: Pt with a dramatic change in gait pattern. Pt swings through now but still requires max cues to maintain weight bearing status on RLE. When pt becomes fatigued he begins ignoring cues and begins to load the toes of the RLE.    Stairs             Wheelchair Mobility    Modified Rankin (Stroke Patients Only)       Balance Overall balance assessment: Needs assistance Sitting-balance support: Single extremity supported;Feet unsupported Sitting balance-Leahy Scale: Good     Standing balance support: Bilateral upper extremity supported Standing balance-Leahy Scale: Poor Standing balance comment: Pt with initial forward flex in standing w/ max cues/mod A for correction.                             Cognition Arousal/Alertness: Awake/alert Behavior During Therapy:  Impulsive Overall Cognitive Status: No family/caregiver present to determine baseline cognitive functioning                                 General Comments: Pt impulsive with slow processing and difficulty following commands. Pt required multiple VC/TC to gait train and use LLE. Pt difficulty  sequencing single and multiple commands.      Exercises General Exercises - Lower Extremity Ankle Circles/Pumps: AROM;15 reps;Both;Seated Quad Sets: AROM;Seated;15 reps;Both Long Arc Quad: AROM;15 reps;Seated;Both Hip ABduction/ADduction: AROM;Seated;15 reps;Both Hip Flexion/Marching: AROM;Seated;15 reps;Both    General Comments        Pertinent Vitals/Pain Faces Pain Scale: Hurts a little bit    Home Living                      Prior Function            PT Goals (current goals can now be found in the care plan section) Acute Rehab PT Goals Patient Stated Goal: none stated PT Goal Formulation: With patient Potential to Achieve Goals: Good    Frequency    Min 3X/week      PT Plan Current plan remains appropriate    Co-evaluation              AM-PAC PT "6 Clicks" Mobility   Outcome Measure  Help needed turning from your back to your side while in a flat bed without using bedrails?: None Help needed moving from lying on your back to sitting on the side of a flat bed without using bedrails?: A Little Help needed moving to and from a bed to a chair (including a wheelchair)?: A Lot Help needed standing up from a chair using your arms (e.g., wheelchair or bedside chair)?: A Lot Help needed to walk in hospital room?: A Lot Help needed climbing 3-5 steps with a railing? : A Lot 6 Click Score: 15    End of Session Equipment Utilized During Treatment: Gait belt;Other (comment)(Darco Shoe) Activity Tolerance: Patient tolerated treatment well Patient left: in chair;with call bell/phone within reach;Other (comment);with nursing/sitter in room Nurse Communication: Mobility status PT Visit Diagnosis: Other abnormalities of gait and mobility (R26.89);History of falling (Z91.81)     Time: 4536-4680 PT Time Calculation (min) (ACUTE ONLY): 28 min  Charges:  $Gait Training: 8-22 mins $Therapeutic Exercise: 8-22 mins                     Maryelizabeth Kaufmann,  SPTA   Maryelizabeth Kaufmann 05/02/2018, 5:03 PM

## 2018-05-02 NOTE — Progress Notes (Signed)
PROGRESS NOTE        PATIENT DETAILS Name: Mark Ayala Age: 63 y.o. Sex: male Date of Birth: 1955/11/07 Admit Date: 04/20/2018 Admitting Physician A Melven Sartorius., MD FIE:PPIR, [name], MD  Brief Narrative: Patient is a 63 y.o. male with history of alcohol use, recurrent ED visits for falls secondary to alcohol intoxication, bipolar disorder, chronic hepatitis C-presented with hyponatremia, alcohol withdrawal symptoms.  He then exhibited suicidal ideation, evaluated by psychiatry with recommendations to transfer to inpatient psychiatry when medically stable.  See below for further details  Subjective: Lying comfortably in bed-denies any chest pain or shortness of breath.  Assessment/Plan: Right second toe osteomyelitis: Underwent right second toe amputation on 2/17, all antimicrobial therapy was discontinued on 2/18.  Orthopedics recommending heel weightbearing in Darco shoe to right lower extremity, wet-to-dry dressing changes twice daily.  Will need follow-up with orthopedics in 2 weeks for suture removal.  Alcohol withdrawal: Improved, and has completed a course of Librium taper.   Acute urinary retention: Continue Flomax-Foley catheter discontinued on 2/20-follow to see if patient can void.    Alcoholic hepatitis: LFTs have improved markedly-follow periodically.  Hypertension: Controlled-continue Cozaar and amlodipine.    Right lower extremity with scattered ulcerations: Present prior to admission-continue wound care per wound care team.  Hyponatremia: Improved-follow periodically.  Suicidal ideation: Evaluated by psychiatry service-awaiting transfer to inpatient psychiatry.  Patient seems to have stabilized somewhat over the past 2 days-denies any suicidal homicidal ideation to me-we will have psychiatry reevaluate to see if he still needs inpatient psych.  Deconditioning: Secondary to acute illness-PT following-if deemed not to require inpatient  psychiatry-social worker to see if we can place patient to SNF for rehab.  DVT Prophylaxis: Prophylactic Lovenox   Code Status: Full code   Family Communication: None at bedside  Disposition Plan: Remain inpatient  Antimicrobial agents: Anti-infectives (From admission, onward)   Start     Dose/Rate Route Frequency Ordered Stop   04/22/18 2100  vancomycin (VANCOCIN) 1,250 mg in sodium chloride 0.9 % 250 mL IVPB  Status:  Discontinued     1,250 mg 166.7 mL/hr over 90 Minutes Intravenous Every 12 hours 04/22/18 0820 04/26/18 1011   04/22/18 0900  ceFEPIme (MAXIPIME) 2 g in sodium chloride 0.9 % 100 mL IVPB  Status:  Discontinued     2 g 200 mL/hr over 30 Minutes Intravenous Every 8 hours 04/22/18 0759 04/26/18 1011   04/22/18 0900  vancomycin (VANCOCIN) 1,750 mg in sodium chloride 0.9 % 500 mL IVPB     1,750 mg 250 mL/hr over 120 Minutes Intravenous  Once 04/22/18 0759 04/22/18 1047      Procedures: 2/17>> 1.  Amputation of right second toe through PIP joint 2.  Adjacent tissue rearrangement right second toe 2 cm 3.  Irrigation debridement of subcutaneous tissue 2 cm  CONSULTS:  orthopedic surgery  Time spent: 15- minutes-Greater than 50% of this time was spent in counseling, explanation of diagnosis, planning of further management, and coordination of care.  MEDICATIONS: Scheduled Meds: . amLODipine  5 mg Oral Daily  . citalopram  20 mg Oral Daily  . enoxaparin (LOVENOX) injection  40 mg Subcutaneous Q24H  . feeding supplement (ENSURE ENLIVE)  237 mL Oral Q24H  . folic acid  1 mg Oral Daily  . Influenza vac split quadrivalent PF  0.5 mL Intramuscular Tomorrow-1000  .  losartan  100 mg Oral Daily  . mouth rinse  15 mL Mouth Rinse BID  . multivitamin with minerals  1 tablet Oral Daily  . pantoprazole  40 mg Oral Daily  . tamsulosin  0.4 mg Oral Daily   Continuous Infusions: . sodium chloride Stopped (04/22/18 0847)   PRN Meds:.sodium chloride, acetaminophen  **OR** acetaminophen, labetalol, nicotine, polyethylene glycol, traMADol   PHYSICAL EXAM: Vital signs: Vitals:   05/01/18 1357 05/01/18 2202 05/02/18 0447 05/02/18 0617  BP: 128/66 138/70  (!) 141/71  Pulse: 88 89  86  Resp: 20 18  14   Temp: 98 F (36.7 C) 98.2 F (36.8 C)  98.2 F (36.8 C)  TempSrc:  Oral  Oral  SpO2: 94% 96%  95%  Weight:   61.1 kg   Height:       Filed Weights   04/28/18 0603 04/30/18 0637 05/02/18 0447  Weight: 89.6 kg 85.5 kg 61.1 kg   Body mass index is 18.27 kg/m.   General appearance:Awake, alert, not in any distress.  Eyes:no scleral icterus. HEENT: Atraumatic and Normocephalic Neck: supple, no JVD. Resp:Good air entry bilaterally,no rales or rhonchi CVS: S1 S2 regular, no murmurs.  GI: Bowel sounds present, Non tender and not distended with no gaurding, rigidity or rebound. Extremities: B/L Lower Ext shows no edema, both legs are warm to touch Neurology:  Non focal  I have personally reviewed following labs and imaging studies  LABORATORY DATA: CBC: Recent Labs  Lab 04/26/18 0354  WBC 5.8  NEUTROABS 4.7  HGB 9.1*  HCT 28.0*  MCV 106.1*  PLT 469    Basic Metabolic Panel: Recent Labs  Lab 04/26/18 0354 04/30/18 0844  NA 133* 136  K 4.6 4.2  CL 102 103  CO2 22 27  GLUCOSE 122* 101*  BUN 15 17  CREATININE 1.05 0.98  CALCIUM 8.6* 8.6*  MG 1.9  --     GFR: Estimated Creatinine Clearance: 67.5 mL/min (by C-G formula based on SCr of 0.98 mg/dL).  Liver Function Tests: Recent Labs  Lab 04/28/18 0426 04/30/18 0844  AST 65* 66*  ALT 34 33  ALKPHOS 80 83  BILITOT 1.1 0.9  PROT 6.0* 6.0*  ALBUMIN 2.1* 2.2*   No results for input(s): LIPASE, AMYLASE in the last 168 hours. No results for input(s): AMMONIA in the last 168 hours.  Coagulation Profile: No results for input(s): INR, PROTIME in the last 168 hours.  Cardiac Enzymes: No results for input(s): CKTOTAL, CKMB, CKMBINDEX, TROPONINI in the last 168 hours.  BNP  (last 3 results) No results for input(s): PROBNP in the last 8760 hours.  HbA1C: No results for input(s): HGBA1C in the last 72 hours.  CBG: No results for input(s): GLUCAP in the last 168 hours.  Lipid Profile: No results for input(s): CHOL, HDL, LDLCALC, TRIG, CHOLHDL, LDLDIRECT in the last 72 hours.  Thyroid Function Tests: No results for input(s): TSH, T4TOTAL, FREET4, T3FREE, THYROIDAB in the last 72 hours.  Anemia Panel: No results for input(s): VITAMINB12, FOLATE, FERRITIN, TIBC, IRON, RETICCTPCT in the last 72 hours.  Urine analysis:    Component Value Date/Time   COLORURINE STRAW (A) 04/20/2018 0850   APPEARANCEUR CLEAR 04/20/2018 0850   LABSPEC 1.004 (L) 04/20/2018 0850   PHURINE 6.0 04/20/2018 0850   GLUCOSEU NEGATIVE 04/20/2018 0850   HGBUR MODERATE (A) 04/20/2018 0850   BILIRUBINUR NEGATIVE 04/20/2018 0850   [place] 04/20/2018 0850   PROTEINUR NEGATIVE 04/20/2018 0850   NITRITE NEGATIVE 04/20/2018 0850  LEUKOCYTESUR NEGATIVE 04/20/2018 0850    Sepsis Labs: Lactic Acid, Venous No results found for: LATICACIDVEN  MICROBIOLOGY: Recent Results (from the past 240 hour(s))  Surgical pcr screen     Status: None   Collection Time: 04/25/18  1:54 AM  Result Value Ref Range Status   MRSA, PCR NEGATIVE NEGATIVE Final   Staphylococcus aureus NEGATIVE NEGATIVE Final    Comment: (NOTE) The Xpert SA Assay (FDA approved for NASAL specimens in patients 78 years of age and older), is one component of a comprehensive surveillance program. It is not intended to diagnose infection nor to guide or monitor treatment. Performed at [facility], [place] [address] [place], [place] [address]     RADIOLOGY STUDIES/RESULTS: Mr Dellie Catholic Right Wo Contrast  Result Date: 04/21/2018 CLINICAL DATA:  Right second toe redness, pain and swelling. Skin ulcerations. EXAM: MRI OF THE RIGHT TOES WITHOUT CONTRAST TECHNIQUE: Multiplanar, multisequence MR imaging of the right  foot was performed. No intravenous contrast was administered. COMPARISON:  Plain films right foot 04/20/2018. FINDINGS: Patient motion degrades the study. Bones/Joint/Cartilage There is marrow edema in the distal phalanx of the second toe worrisome for osteomyelitis. Hallux valgus is noted. The patient has some osteoarthritic change about the first and second MTP joints. Subchondral edema in the head of the second metatarsal is noted. Ligaments Appear intact. Muscles and Tendons There is some atrophy of intrinsic musculature of the foot. No intramuscular fluid collection. Soft tissues Subcutaneous edema is seen over the dorsum of the foot. No joint effusion or abscess. IMPRESSION: Motion degraded examination demonstrates marrow edema in the distal phalanx of the second toe most consistent with osteomyelitis. Negative for abscess, myositis or septic joint. 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 Hallux valgus. Electronically Signed   By: Inge Rise M.D.   On: 04/21/2018 16:00   Dg Foot Complete Right  Result Date: 04/20/2018 CLINICAL DATA:  Swollen right foot.  No specific injury. EXAM: RIGHT FOOT COMPLETE - 3+ VIEW COMPARISON:  None. FINDINGS: Diffuse soft tissue swelling is noted, mainly along the dorsum of the foot. There are mild degenerative changes but no acute bony findings or destructive bony changes. Degenerative changes at the first MTP joint with hallux valgus deformity. Small calcaneal heel spur. IMPRESSION: Mild degenerative changes but no acute bony findings. Electronically Signed   By: Marijo Sanes M.D.   On: 04/20/2018 11:31   Vas [place] Burnard Bunting With/wo Tbi  Result Date: 04/20/2018 LOWER EXTREMITY DOPPLER STUDY Indications: Ulceration, and peripheral artery disease.  Performing Technologist: Birdena Crandall, [place] [facility]  Examination Guidelines: A complete evaluation includes at minimum, Doppler waveform signals and systolic blood pressure reading at the level of  bilateral brachial, anterior tibial, and posterior tibial arteries, when vessel segments are accessible. Bilateral testing is considered an integral part of a complete examination. Photoelectric Plethysmograph (PPG) waveforms and toe systolic pressure readings are included as required and additional duplex testing as needed. Limited examinations for reoccurring indications may be performed as noted.  ABI Findings: +---------+------------------+-----+---------+---------------------------------+ Right    Rt Pressure (mmHg)IndexWaveform Comment                           +---------+------------------+-----+---------+---------------------------------+ Brachial 180                    triphasic                                  +---------+------------------+-----+---------+---------------------------------+  PTA      246               1.28 biphasic                                   +---------+------------------+-----+---------+---------------------------------+ DP       245               1.28 biphasic                                   +---------+------------------+-----+---------+---------------------------------+ Great Toe                                Unable to obtain due to constant                                           involuntary movement of the foot  +---------+------------------+-----+---------+---------------------------------+ +---------+------------------+-----+----------+--------------------------------+ Left     Lt Pressure (mmHg)IndexWaveform  Comment                          +---------+------------------+-----+----------+--------------------------------+ Brachial 192                    triphasic                                  +---------+------------------+-----+----------+--------------------------------+ PTA      221               1.15 monophasic                                  +---------+------------------+-----+----------+--------------------------------+ DP       199               1.04 monophasic                                 +---------+------------------+-----+----------+--------------------------------+ Great Toe                                 Unable to obtan due to constan                                             involuntary movement of the foot +---------+------------------+-----+----------+--------------------------------+ +-------+-----------+--------------------------------+------------+------------+ ABI/TBIToday's ABIToday's TBI                     Previous ABIPrevious TBI +-------+-----------+--------------------------------+------------+------------+ Right  1.28       Unable to obtain see comment                                               above                                                    +-------+-----------+--------------------------------+------------+------------+  Left   1.15       Unable to obtain see comment                                               above                                                    +-------+-----------+--------------------------------+------------+------------+  Summary: Right: Resting right ankle-brachial index is within normal range. No evidence of significant right lower extremity arterial disease. Left: Resting left ankle-brachial index is within normal range. No evidence of significant left lower extremity arterial disease.  *See table(s) above for measurements and observations.  Electronically signed by Curt Jews MD on 04/20/2018 at 3:09:22 PM.    Final    Dg Hip Unilat With Pelvis 2-3 Views Left  Result Date: 04/20/2018 CLINICAL DATA:  Left hip region pain EXAM: DG HIP (WITH OR WITHOUT PELVIS) 2-3V LEFT COMPARISON:  None. FINDINGS: Frontal pelvis as well as frontal and lateral left hip images were obtained. There is no acute fracture or dislocation. There is marked  narrowing of the left hip joint. There are cystic changes in the left femoral head with areas of intermingled sclerosis and slight cortical irregularity. There is moderate narrowing of the right hip joint. Sacroiliac joints appear normal bilaterally. No bony destruction. There are soft tissue calcifications in the midline perineal region. 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 There is milder osteoarthritic change in the right hip joint. No fracture or dislocation. Small calcifications in the midline perineum, likely due to chronic inflammation. Electronically Signed   By: Lowella Grip III M.D.   On: 04/20/2018 10:02     LOS: 12 days   Oren Binet, MD  [facility]  If 7PM-7AM, please contact night-coverage  Please page via [email or web address]  Go to [email or web address] and use [place]'s universal password to access. If you do not have the password, please contact the hospital operator.  Locate the [facility] provider you are looking for under [facility] and page to a number that you can be directly reached. If you still have difficulty reaching the provider, please page the [facility] (Director on Call) for the Hospitalists listed on amion for assistance.  05/02/2018, 11:50 AM

## 2018-05-02 NOTE — Consult Note (Addendum)
[facility] Psych Consult Progress Note  05/02/2018 1:12 PM Mark Ayala  MRN:  [id number] Subjective:   Mark Ayala was last seen by the psychiatry consult service on 2/14 for SI. He continued to endorse SI and was unable to safety plan so he was recommended for inpatient psychiatric hospitalization. He has reportedly been denying SI so psychiatry has been asked to reevaluate patient. He is recommended for SNF to complete rehab if he no longer warrants inpatient psychiatric hospitalization.   On interview, Mark Ayala reports, "Thing are working out for me." He denies SI and reports that his family is a protective factor. He denies HI or AVH. He reports problems with falling asleep due to worries. He reports a good appetite. He denies cravings for alcohol at this time. He does not like attending [place] meetings. He reports having had encounters with sponsors who are intoxicated. He would like additional resources for substance abuse treatment. He is homeless. He plans to have his sister manage his social security check.   Principal Problem: Alcohol use disorder, severe, dependence ([place]) Diagnosis:  Principal Problem:   Toe osteomyelitis, right (HCC) Active Problems:   Chronic hepatitis C without hepatic coma (HCC)   Elevated liver enzymes   Hyponatremia   Alcohol abuse   Suicidal ideation   Pressure injury of skin   Leg wound, right, initial encounter  Total Time spent with patient: 15 minutes  Past Psychiatric History: Depression   Past Medical History:  Past Medical History:  Diagnosis Date  . Arthritis   . Depression   . GERD (gastroesophageal reflux disease)   . Hepatitis    Hepititis C  . Hypertension     Past Surgical History:  Procedure Laterality Date  . COLONOSCOPY WITH PROPOFOL N/A 02/10/2016   Procedure: COLONOSCOPY WITH PROPOFOL;  Surgeon: Clarene Essex, MD;  Location: [facility];  Service: Endoscopy;  Laterality: N/A;  . ESOPHAGOGASTRODUODENOSCOPY (EGD) WITH PROPOFOL N/A 02/10/2016    Procedure: ESOPHAGOGASTRODUODENOSCOPY (EGD) WITH PROPOFOL;  Surgeon: Clarene Essex, MD;  Location: [facility];  Service: Endoscopy;  Laterality: N/A;  . MINOR AMPUTATION OF DIGIT Right 04/25/2018   Procedure: PARTIAL  AMPUTATION OF RIGHT 2nd TOE;  Surgeon: Leandrew Koyanagi, MD;  Location: [place];  Service: Orthopedics;  Laterality: Right;  . WISDOM TOOTH EXTRACTION     upper right wisdom tooth   Family History: History reviewed. No pertinent family history. Family Psychiatric  History: Denies  Social History:  Social History   Substance and Sexual Activity  Alcohol Use Yes   Comment: Admits to drinking earlier today      Social History   Substance and Sexual Activity  Drug Use No   Comment: used in age 4's    Social History   Socioeconomic History  . Marital status: Divorced    Spouse name: Not on file  . Number of children: Not on file  . Years of education: Not on file  . Highest education level: Not on file  Occupational History  . Not on file  Social Needs  . Financial resource strain: Not on file  . Food insecurity:    Worry: Not on file    Inability: Not on file  . Transportation needs:    Medical: Not on file    Non-medical: Not on file  Tobacco Use  . Smoking status: Current Every Day Smoker    Types: Cigars  . Smokeless tobacco: Never Used  Substance and Sexual Activity  . Alcohol use: Yes  Comment: Admits to drinking earlier today   . Drug use: No    Comment: used in age 1's  . Sexual activity: Yes    Partners: Female  Lifestyle  . Physical activity:    Days per week: Not on file    Minutes per session: Not on file  . Stress: Not on file  Relationships  . Social connections:    Talks on phone: Not on file    Gets together: Not on file    Attends religious service: Not on file    Active member of club or organization: Not on file    Attends meetings of clubs or organizations: Not on file    Relationship status: Not on file  Other Topics Concern   . Not on file  Social History Narrative  . Not on file    Sleep: Poor  Appetite:  Good  Current Medications: Current Facility-Administered Medications  Medication Dose Route Frequency Provider Last Rate Last Dose  . 0.9 %  sodium chloride infusion   Intravenous PRN Leandrew Koyanagi, MD   Stopped at 04/22/18 [phone]  . acetaminophen (TYLENOL) tablet 650 mg  650 mg Oral Q6H PRN Leandrew Koyanagi, MD   650 mg at 04/29/18 4235   Or  . acetaminophen (TYLENOL) suppository 650 mg  650 mg Rectal Q6H PRN Leandrew Koyanagi, MD      . amLODipine (NORVASC) tablet 5 mg  5 mg Oral Daily Leandrew Koyanagi, MD   5 mg at 05/02/18 3614  . citalopram (CELEXA) tablet 20 mg  20 mg Oral Daily Aline August, MD   20 mg at 05/02/18 0939  . enoxaparin (LOVENOX) injection 40 mg  40 mg Subcutaneous Q24H Starla Link, Kshitiz, MD   40 mg at 05/01/18 1359  . feeding supplement (ENSURE ENLIVE) (ENSURE ENLIVE) liquid 237 mL  237 mL Oral Q24H Leandrew Koyanagi, MD   237 mL at 05/01/18 1359  . folic acid (FOLVITE) tablet 1 mg  1 mg Oral Daily Leandrew Koyanagi, MD   1 mg at 05/02/18 4315  . Influenza vac split quadrivalent PF (FLUARIX) injection 0.5 mL  0.5 mL Intramuscular Tomorrow-1000 Leandrew Koyanagi, MD      . labetalol (NORMODYNE,TRANDATE) injection 10 mg  10 mg Intravenous Q2H PRN Leandrew Koyanagi, MD   10 mg at 04/20/18 2305  . losartan (COZAAR) tablet 100 mg  100 mg Oral Daily Leandrew Koyanagi, MD   100 mg at 05/02/18 4008  . MEDLINE mouth rinse  15 mL Mouth Rinse BID Leandrew Koyanagi, MD   15 mL at 05/01/18 2208  . multivitamin with minerals tablet 1 tablet  1 tablet Oral Daily Leandrew Koyanagi, MD   1 tablet at 05/02/18 6761  . nicotine (NICODERM CQ - dosed in mg/24 hr) patch 7 mg  7 mg Transdermal Daily PRN Leandrew Koyanagi, MD      . pantoprazole (PROTONIX) EC tablet 40 mg  40 mg Oral Daily Leandrew Koyanagi, MD   40 mg at 05/02/18 9509  . polyethylene glycol (MIRALAX / GLYCOLAX) packet 17 g  17 g Oral Daily PRN Leandrew Koyanagi, MD      . tamsulosin [facility])  capsule 0.4 mg  0.4 mg Oral Daily Jonetta Osgood, MD   0.4 mg at 05/02/18 3267  . traMADol (ULTRAM) tablet 50 mg  50 mg Oral Q6H PRN Aline August, MD   50 mg at 05/01/18 2333    Lab Results:  No results found for this or any previous visit (from the past 48 hour(s)).  Blood Alcohol level:  Lab Results  Component Value Date   ETH 83 (H) 04/20/2018   ETH 184 (H) 12/28/2017     Musculoskeletal: Strength & Muscle Tone: within normal limits Gait & Station: UTA since patient is lying in bed. Patient leans: N/A  Psychiatric Specialty Exam: Physical Exam  Nursing note and vitals reviewed. Constitutional: He is oriented to person, place, and time. He appears well-developed and well-nourished.  HENT:  Head: Normocephalic and atraumatic.  Neck: Normal range of motion.  Respiratory: Effort normal.  Musculoskeletal: Normal range of motion.  Neurological: He is alert and oriented to person, place, and time.  Psychiatric: He has a normal mood and affect. His speech is normal and behavior is normal. Judgment and thought content normal. Cognition and memory are normal.    Review of Systems  Cardiovascular: Negative for chest pain.  Gastrointestinal: Negative for abdominal pain, constipation, diarrhea, nausea and vomiting.  Psychiatric/Behavioral: Positive for substance abuse. Negative for depression, hallucinations and suicidal ideas. The patient is nervous/anxious and has insomnia.   All other systems reviewed and are negative.   Blood pressure (!) 141/71, pulse 86, temperature 98.2 F (36.8 C), temperature source Oral, resp. rate 14, height 6' (1.829 m), weight 61.1 kg, SpO2 95 %.Body mass index is 18.27 kg/m.  General Appearance: Fairly Groomed, middle aged, Caucasian male, wearing a hospital gown with corrective lenses and unshaved face with gray hair who is lying in bed. NAD.   Eye Contact:  Good  Speech:  Clear and Coherent and Normal Rate  Volume:  Normal  Mood:  Euthymic   Affect:  Appropriate and Congruent  Thought Process:  Goal Directed, Linear and Descriptions of Associations: Intact  Orientation:  Full (Time, Place, and Person)  Thought Content:  Logical  Suicidal Thoughts:  No  Homicidal Thoughts:  No  Memory:  Immediate;   Good Recent;   Good Remote;   Good  Judgement:  Fair  Insight:  Fair  Psychomotor Activity:  Normal  Concentration:  Concentration: Good and Attention Span: Good  Recall:  Good  Fund of Knowledge:  Good  Language:  Good  Akathisia:  No  Handed:  Right  AIMS (if indicated):   N/A  Assets:  Communication Skills Desire for Improvement Financial Resources/Insurance Resilience Social Support  ADL's:  Impaired  Cognition:  WNL  Sleep:   Poor   Assessment:  Dawsen Krieger is a 63 y.o. male who was admitted with hyponatremia and alcohol withdrawal. He denies SI, HI or AVH. He is future oriented and able to safety plan. He does report access to guns but they are securely stored and he does not have ammunition. He does not warrant inpatient psychiatric hospitalization at this time and will be more appropriate for SNF placement to complete rehab.    Treatment Plan Summary: -Continue Celexa 20 mg daily for depression.  -Consider Trazodone PRN for sleep.  -EKG reviewed and QTc 430 on 2/22. Please closely monitor when starting or increasing QTc prolonging agents.  -Patient should follow up with his outpatient provider for further medication management.  -Please have SW provide patient with substance abuse treatment resources.  -Psychiatry will sign off on patient at this time. Please consult psychiatry again as needed.    Faythe Dingwall, DO 05/02/2018, 1:12 PM

## 2018-05-02 NOTE — Progress Notes (Signed)
Orthopedic Tech Progress Note Patient Details:  Mark Ayala [birth year] [id number]  Ortho Devices Type of Ortho Device: Postop shoe/boot Ortho Device/Splint Interventions: Ordered, Application, Adjustment   Post Interventions Patient Tolerated: Well Instructions Provided: Adjustment of device, Care of device   Jacinto Keil J Edita Weyenberg 05/02/2018, 6:10 PM

## 2018-05-03 MED ORDER — ENSURE ENLIVE PO LIQD
237.0000 mL | Freq: Two times a day (BID) | ORAL | Status: DC
  Administered 2018-05-03 – 2018-05-18 (×30): 237 mL via ORAL

## 2018-05-03 NOTE — Progress Notes (Signed)
Spoke with Dr. Sloan Leiter about patient needing a sitter. Per MD ok to discontinue sitter  Since pyschiatry has cleared pt and he is no longer IVC'd

## 2018-05-03 NOTE — Progress Notes (Signed)
CSW spoke with patient regarding SNF placement again. Patient reported that he is willing to go and sign his SSI check over to the SNF if DSS requires it. CSW reached out to SNFs but no bed offers yet. Patient has multiple barriers to consider. Liaison from [place] to assess patient in-person tomorrow.   CSW rescinded IVC as patient no longer warrants it.   Percell Locus Altonio Schwertner LCSW [phone]

## 2018-05-03 NOTE — Progress Notes (Signed)
PROGRESS NOTE        PATIENT DETAILS Name: Mark Ayala Age: 63 y.o. Sex: male Date of Birth: 12/27/1955 Admit Date: 04/20/2018 Admitting Physician A Melven Sartorius., MD DXA:JOIN, [name], MD  Brief Narrative: Patient is a 63 y.o. male with history of alcohol use, recurrent ED visits for falls secondary to alcohol intoxication, bipolar disorder, chronic hepatitis C-presented with hyponatremia, alcohol withdrawal symptoms.  He then exhibited suicidal ideation, evaluated by psychiatry with recommendations to transfer to inpatient psychiatry when medically stable.  See below for further details  Subjective: No chest pain or shortness of breath-lying comfortably in bed.  Mood is stable-he is very pleasant this morning.  Assessment/Plan: Right second toe osteomyelitis: Underwent right second toe amputation on 2/17, all antimicrobial therapy was discontinued on 2/18.  Orthopedics recommending heel weightbearing in Darco shoe to right lower extremity, wet-to-dry dressing changes twice daily.  Will need follow-up with orthopedics in 2 weeks for suture removal.  Alcohol withdrawal: Improved, and has completed a course of Librium taper.   Acute urinary retention: Continue Flomax-Foley catheter discontinued on 2/20-follow to see if patient can void.    Alcoholic hepatitis: LFTs have improved markedly-follow periodically.  Hypertension: Controlled-continue Cozaar and amlodipine.    Right lower extremity with scattered ulcerations: Present prior to admission-continue wound care per wound care team.  Hyponatremia: Improved-follow periodically.  Suicidal ideation: Evaluated by psychiatry service-initially plans were to transfer to inpatient psychiatry.  However patient has been somewhat stabilized while waiting for inpatient psychiatry bed.  He remains on Celexa.  Patient was reevaluated by psychiatry on 2/24, per psychiatry-patient does not need transfer to inpatient  psych at this point.  Will need follow-up with psychiatry as an outpatient.  Deconditioning: Secondary to acute illness-since he does not require transfer to inpatient psych-I have asked social worker to see if he can get him to SNF for subacute rehab  DVT Prophylaxis: Prophylactic Lovenox   Code Status: Full code   Family Communication: None at bedside  Disposition Plan: Remain inpatient  Antimicrobial agents: Anti-infectives (From admission, onward)   Start     Dose/Rate Route Frequency Ordered Stop   04/22/18 2100  vancomycin (VANCOCIN) 1,250 mg in sodium chloride 0.9 % 250 mL IVPB  Status:  Discontinued     1,250 mg 166.7 mL/hr over 90 Minutes Intravenous Every 12 hours 04/22/18 0820 04/26/18 1011   04/22/18 0900  ceFEPIme (MAXIPIME) 2 g in sodium chloride 0.9 % 100 mL IVPB  Status:  Discontinued     2 g 200 mL/hr over 30 Minutes Intravenous Every 8 hours 04/22/18 0759 04/26/18 1011   04/22/18 0900  vancomycin (VANCOCIN) 1,750 mg in sodium chloride 0.9 % 500 mL IVPB     1,750 mg 250 mL/hr over 120 Minutes Intravenous  Once 04/22/18 0759 04/22/18 1047      Procedures: 2/17>> 1.  Amputation of right second toe through PIP joint 2.  Adjacent tissue rearrangement right second toe 2 cm 3.  Irrigation debridement of subcutaneous tissue 2 cm  CONSULTS:  orthopedic surgery  Time spent: 25- minutes-Greater than 50% of this time was spent in counseling, explanation of diagnosis, planning of further management, and coordination of care.  MEDICATIONS: Scheduled Meds: . amLODipine  5 mg Oral Daily  . citalopram  20 mg Oral Daily  . enoxaparin (LOVENOX) injection  40 mg Subcutaneous Q24H  .  feeding supplement (ENSURE ENLIVE)  237 mL Oral Q24H  . folic acid  1 mg Oral Daily  . Influenza vac split quadrivalent PF  0.5 mL Intramuscular Tomorrow-1000  . losartan  100 mg Oral Daily  . mouth rinse  15 mL Mouth Rinse BID  . multivitamin with minerals  1 tablet Oral Daily  .  pantoprazole  40 mg Oral Daily  . tamsulosin  0.4 mg Oral Daily   Continuous Infusions: . sodium chloride Stopped (04/22/18 0847)   PRN Meds:.sodium chloride, acetaminophen **OR** acetaminophen, labetalol, nicotine, polyethylene glycol, traMADol   PHYSICAL EXAM: Vital signs: Vitals:   05/02/18 0617 05/02/18 2247 05/03/18 0443 05/03/18 0515  BP: (!) 141/71 (!) 149/75  (!) 146/80  Pulse: 86 89  85  Resp: 14 18  18   Temp: 98.2 F (36.8 C) 98.5 F (36.9 C)  98.1 F (36.7 C)  TempSrc: Oral Oral  Oral  SpO2: 95% 99%  100%  Weight:   61.2 kg   Height:       Filed Weights   04/30/18 0637 05/02/18 0447 05/03/18 0443  Weight: 85.5 kg 61.1 kg 61.2 kg   Body mass index is 18.3 kg/m.   General appearance:Awake, alert, not in any distress.  Eyes:no scleral icterus. HEENT: Atraumatic and Normocephalic Neck: supple, no JVD. Resp:Good air entry bilaterally,no rales or rhonchi CVS: S1 S2 regular, no murmurs.  GI: Bowel sounds present, Non tender and not distended with no gaurding, rigidity or rebound. Extremities: B/L Lower Ext shows no edema, both legs are warm to touch Neurology:  Non focal  I have personally reviewed following labs and imaging studies  LABORATORY DATA: CBC: No results for input(s): WBC, NEUTROABS, HGB, HCT, MCV, PLT in the last 168 hours.  Basic Metabolic Panel: Recent Labs  Lab 04/30/18 0844  NA 136  K 4.2  CL 103  CO2 27  GLUCOSE 101*  BUN 17  CREATININE 0.98  CALCIUM 8.6*    GFR: Estimated Creatinine Clearance: 67.7 mL/min (by C-G formula based on SCr of 0.98 mg/dL).  Liver Function Tests: Recent Labs  Lab 04/28/18 0426 04/30/18 0844  AST 65* 66*  ALT 34 33  ALKPHOS 80 83  BILITOT 1.1 0.9  PROT 6.0* 6.0*  ALBUMIN 2.1* 2.2*   No results for input(s): LIPASE, AMYLASE in the last 168 hours. No results for input(s): AMMONIA in the last 168 hours.  Coagulation Profile: No results for input(s): INR, PROTIME in the last 168  hours.  Cardiac Enzymes: No results for input(s): CKTOTAL, CKMB, CKMBINDEX, TROPONINI in the last 168 hours.  BNP (last 3 results) No results for input(s): PROBNP in the last 8760 hours.  HbA1C: No results for input(s): HGBA1C in the last 72 hours.  CBG: No results for input(s): GLUCAP in the last 168 hours.  Lipid Profile: No results for input(s): CHOL, HDL, LDLCALC, TRIG, CHOLHDL, LDLDIRECT in the last 72 hours.  Thyroid Function Tests: No results for input(s): TSH, T4TOTAL, FREET4, T3FREE, THYROIDAB in the last 72 hours.  Anemia Panel: No results for input(s): VITAMINB12, FOLATE, FERRITIN, TIBC, IRON, RETICCTPCT in the last 72 hours.  Urine analysis:    Component Value Date/Time   COLORURINE STRAW (A) 04/20/2018 0850   APPEARANCEUR CLEAR 04/20/2018 0850   LABSPEC 1.004 (L) 04/20/2018 0850   PHURINE 6.0 04/20/2018 0850   GLUCOSEU NEGATIVE 04/20/2018 0850   HGBUR MODERATE (A) 04/20/2018 0850   BILIRUBINUR NEGATIVE 04/20/2018 0850   KETONESUR NEGATIVE 04/20/2018 0850   PROTEINUR NEGATIVE 04/20/2018  0850   NITRITE NEGATIVE 04/20/2018 0850   LEUKOCYTESUR NEGATIVE 04/20/2018 0850    Sepsis Labs: Lactic Acid, Venous No results found for: LATICACIDVEN  MICROBIOLOGY: Recent Results (from the past 240 hour(s))  Surgical pcr screen     Status: None   Collection Time: 04/25/18  1:54 AM  Result Value Ref Range Status   MRSA, PCR NEGATIVE NEGATIVE Final   Staphylococcus aureus NEGATIVE NEGATIVE Final    Comment: (NOTE) The Xpert SA Assay (FDA approved for NASAL specimens in patients 49 years of age and older), is one component of a comprehensive surveillance program. It is not intended to diagnose infection nor to guide or monitor treatment. Performed at [facility], [place] [address], [place], [place] [address]     RADIOLOGY STUDIES/RESULTS: Mr Dellie Catholic Right Wo Contrast  Result Date: 04/21/2018 CLINICAL DATA:  Right second toe redness, pain and swelling. Skin  ulcerations. EXAM: MRI OF THE RIGHT TOES WITHOUT CONTRAST TECHNIQUE: Multiplanar, multisequence MR imaging of the right foot was performed. No intravenous contrast was administered. COMPARISON:  Plain films right foot 04/20/2018. FINDINGS: Patient motion degrades the study. Bones/Joint/Cartilage There is marrow edema in the distal phalanx of the second toe worrisome for osteomyelitis. Hallux valgus is noted. The patient has some osteoarthritic change about the first and second MTP joints. Subchondral edema in the head of the second metatarsal is noted. Ligaments Appear intact. Muscles and Tendons There is some atrophy of intrinsic musculature of the foot. No intramuscular fluid collection. Soft tissues Subcutaneous edema is seen over the dorsum of the foot. No joint effusion or abscess. IMPRESSION: Motion degraded examination demonstrates marrow edema in the distal phalanx of the second toe most consistent with osteomyelitis. Negative for abscess, myositis or septic joint. 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 Hallux valgus. Electronically Signed   By: Inge Rise M.D.   On: 04/21/2018 16:00   Dg Foot Complete Right  Result Date: 04/20/2018 CLINICAL DATA:  Swollen right foot.  No specific injury. EXAM: RIGHT FOOT COMPLETE - 3+ VIEW COMPARISON:  None. FINDINGS: Diffuse soft tissue swelling is noted, mainly along the dorsum of the foot. There are mild degenerative changes but no acute bony findings or destructive bony changes. Degenerative changes at the first MTP joint with hallux valgus deformity. Small calcaneal heel spur. IMPRESSION: Mild degenerative changes but no acute bony findings. Electronically Signed   By: Marijo Sanes M.D.   On: 04/20/2018 11:31   Vas [place] Burnard Bunting With/wo Tbi  Result Date: 04/20/2018 LOWER EXTREMITY DOPPLER STUDY Indications: Ulceration, and peripheral artery disease.  Performing Technologist: Birdena Crandall, [place] [facility]  Examination Guidelines: A  complete evaluation includes at minimum, Doppler waveform signals and systolic blood pressure reading at the level of bilateral brachial, anterior tibial, and posterior tibial arteries, when vessel segments are accessible. Bilateral testing is considered an integral part of a complete examination. Photoelectric Plethysmograph (PPG) waveforms and toe systolic pressure readings are included as required and additional duplex testing as needed. Limited examinations for reoccurring indications may be performed as noted.  ABI Findings: +---------+------------------+-----+---------+---------------------------------+ Right    Rt Pressure (mmHg)IndexWaveform Comment                           +---------+------------------+-----+---------+---------------------------------+ Brachial 180                    triphasic                                  +---------+------------------+-----+---------+---------------------------------+  PTA      246               1.28 biphasic                                   +---------+------------------+-----+---------+---------------------------------+ DP       245               1.28 biphasic                                   +---------+------------------+-----+---------+---------------------------------+ Great Toe                                Unable to obtain due to constant                                           involuntary movement of the foot  +---------+------------------+-----+---------+---------------------------------+ +---------+------------------+-----+----------+--------------------------------+ Left     Lt Pressure (mmHg)IndexWaveform  Comment                          +---------+------------------+-----+----------+--------------------------------+ Brachial 192                    triphasic                                  +---------+------------------+-----+----------+--------------------------------+ PTA      221               1.15  monophasic                                 +---------+------------------+-----+----------+--------------------------------+ DP       199               1.04 monophasic                                 +---------+------------------+-----+----------+--------------------------------+ Great Toe                                 Unable to obtan due to constan                                             involuntary movement of the foot +---------+------------------+-----+----------+--------------------------------+ +-------+-----------+--------------------------------+------------+------------+ ABI/TBIToday's ABIToday's TBI                     Previous ABIPrevious TBI +-------+-----------+--------------------------------+------------+------------+ Right  1.28       Unable to obtain see comment                                               above                                                    +-------+-----------+--------------------------------+------------+------------+  Left   1.15       Unable to obtain see comment                                               above                                                    +-------+-----------+--------------------------------+------------+------------+  Summary: Right: Resting right ankle-brachial index is within normal range. No evidence of significant right lower extremity arterial disease. Left: Resting left ankle-brachial index is within normal range. No evidence of significant left lower extremity arterial disease.  *See table(s) above for measurements and observations.  Electronically signed by Curt Jews MD on 04/20/2018 at 3:09:22 PM.    Final    Dg Hip Unilat With Pelvis 2-3 Views Left  Result Date: 04/20/2018 CLINICAL DATA:  Left hip region pain EXAM: DG HIP (WITH OR WITHOUT PELVIS) 2-3V LEFT COMPARISON:  None. FINDINGS: Frontal pelvis as well as frontal and lateral left hip images were obtained. There is no acute  fracture or dislocation. There is marked narrowing of the left hip joint. There are cystic changes in the left femoral head with areas of intermingled sclerosis and slight cortical irregularity. There is moderate narrowing of the right hip joint. Sacroiliac joints appear normal bilaterally. No bony destruction. There are soft tissue calcifications in the midline perineal region. 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 There is milder osteoarthritic change in the right hip joint. No fracture or dislocation. Small calcifications in the midline perineum, likely due to chronic inflammation. Electronically Signed   By: Lowella Grip III M.D.   On: 04/20/2018 10:02     LOS: 13 days   Oren Binet, MD  [facility]  If 7PM-7AM, please contact night-coverage  Please page via [email or web address]  Go to [email or web address] and use [facility]'s universal password to access. If you do not have the password, please contact the hospital operator.  Locate the [facility] provider you are looking for under [facility] and page to a number that you can be directly reached. If you still have difficulty reaching the provider, please page the [facility] (Director on Call) for the Hospitalists listed on amion for assistance.  05/03/2018, 10:43 AM

## 2018-05-03 NOTE — Clinical Social Work Note (Signed)
Clinical Social Work Assessment  Patient Details  Name: Mark Ayala MRN: [id number] Date of Birth: 01-30-1956  Date of referral:  05/03/18               Reason for consult:  Facility Placement, Discharge Planning                Permission sought to share information with:  Family Supports Permission granted to share information::  Yes, Verbal Permission Granted  Name::     Abigail  Agency::     Relationship::  Daughter   Contact Information:  [phone]  Housing/Transportation Living arrangements for the past 2 months:  No permanent address(Patient reported that he no longer lives on [address] but was unclear regarding his current living situation.) Source of Information:  Patient Patient Interpreter Needed:  None Criminal Activity/Legal Involvement Pertinent to Current Situation/Hospitalization:  No - Comment as needed Significant Relationships:  Adult Children, Siblings(Patient confirmed that Zamier Eggebrecht is his brother and informed CSW regarding his daughter Vernie Shanks) Lives with:  Other (Comment)(Not sure about patient's current living status) Do you feel safe going back to the place where you live?  No(Patient not sure where he will live post discharge) Need for family participation in patient care:  No (Coment)  Care giving concerns:  Patient currently does not have a stable living arrangement and is willing to consider going to a nursing facility for nursing care for a minimum of 30 days.  Social Worker assessment / plan:  CSW talked with patient at the bedside regarding his discharge disposition and recommendation of 24/7 care and supervision. Mr. Vuncannon was lying in bed and was alert, oriented and engaged easily with CSW. Patient did have a sitter in the room. CSW talked with Mr. Sprung regarding going to a nursing facility and the services that would be offered. CSW emphasized to that he would have to stay at the nursing facility for a minimum of 30 days and Mr. Lile expressed  understanding. Patient requested to think about SNF placement and was informed that the unit CSW would follow-up with him later today.    Employment status:  Disabled (Comment on whether or not currently receiving Disability) Insurance information:  Medicaid In Standard PT Recommendations:  24 Hour Supervision Information / Referral to community resources:  Skilled Nursing Facility(Patient provided with SNF list)  Patient/Family's Response to care:  No concerns expressed by patient regarding his care during hospitalization.  Patient/Family's Understanding of and Emotional Response to Diagnosis, Current Treatment, and Prognosis:  Mr. Raineri expressed willingness to think about going to a nursing facility and will talk further with unit CSW regarding his discharge disposition.  Emotional Assessment Appearance:  Appears older than stated age Attitude/Demeanor/Rapport:  Engaged Affect (typically observed):  Appropriate, Pleasant Orientation:  Oriented to Self, Oriented to Place, Oriented to  Time, Oriented to Situation Alcohol / Substance use:  Tobacco Use, Alcohol Use, Illicit Drugs(Patient reports that he smokes cigars, drinks alcohol and does not use illicit drugs) Psych involvement (Current and /or in the community):  Yes (Comment)  Discharge Needs  Concerns to be addressed:  Discharge Planning Concerns Readmission within the last 30 days:  No Current discharge risk:  None Barriers to Discharge:  Continued Medical Work up, No SNF bed   Sable Feil, LCSW 05/03/2018, 12:16 PM

## 2018-05-03 NOTE — NC FL2 (Signed)
[place] LEVEL OF CARE SCREENING TOOL     IDENTIFICATION  Patient Name: Mark Ayala Birthdate: 26-Jan-1956 Sex: male Admission Date (Current Location): 04/20/2018  [facility] and [place] Number:  Herbalist and Address:  [facility] [place], [place] [address] [place], [place] [address]      Provider Number: [id number]  Attending Physician Name and Address:  Jonetta Osgood, MD  Relative Name and Phone Number:       Current Level of Care: Hospital Recommended Level of Care: [place] Prior Approval Number:    Date Approved/Denied:   PASRR Number:    Discharge Plan: SNF    Current Diagnoses: Patient Active Problem List   Diagnosis Date Noted  . Alcohol use disorder, severe, dependence ([place])   . Leg wound, right, initial encounter 04/25/2018  . Pressure injury of skin 04/24/2018  . Toe osteomyelitis, right ([place])   . Suicidal ideation   . Hyponatremia 04/20/2018  . Alcohol abuse 04/20/2018  . ARF (acute renal failure) ([place]) 10/14/2017  . Elevated liver enzymes 10/14/2017  . GERD (gastroesophageal reflux disease) 10/14/2017  . Essential hypertension 10/14/2017  . Chronic hepatitis C without hepatic coma ([place]) 01/30/2014    Orientation RESPIRATION BLADDER Height & Weight     Self, Time, Situation, Place  Normal Continent Weight: 61.2 kg Height:  6' (182.9 cm)  BEHAVIORAL SYMPTOMS/MOOD NEUROLOGICAL BOWEL NUTRITION STATUS      Continent Diet(Please see DC Summary)  AMBULATORY STATUS COMMUNICATION OF NEEDS Skin   Limited Assist Verbally Other (Comment), PU Stage and Appropriate Care(Deep tissue injury on sacrum; toe amputated;unstageable PU on pretibial and tibial; )                       Personal Care Assistance Level of Assistance  Bathing, Feeding, Dressing Bathing Assistance: Limited assistance Feeding assistance: Independent Dressing Assistance: Limited assistance     Functional Limitations Info   Sight, Hearing, Speech Sight Info: Adequate Hearing Info: Adequate Speech Info: Adequate    SPECIAL CARE FACTORS FREQUENCY  PT (By licensed PT), OT (By licensed OT)     PT Frequency: 3x/week OT Frequency: 2x/week            Contractures Contractures Info: Not present    Additional Factors Info  Code Status, Allergies, Psychotropic Code Status Info: Full Allergies Info: Codeine Psychotropic Info: Celexa         Current Medications (05/03/2018):  This is the current hospital active medication list Current Facility-Administered Medications  Medication Dose Route Frequency Provider Last Rate Last Dose  . 0.9 %  sodium chloride infusion   Intravenous PRN Leandrew Koyanagi, MD   Stopped at 04/22/18 [phone]  . acetaminophen (TYLENOL) tablet 650 mg  650 mg Oral Q6H PRN Leandrew Koyanagi, MD   650 mg at 05/02/18 1648   Or  . acetaminophen (TYLENOL) suppository 650 mg  650 mg Rectal Q6H PRN Leandrew Koyanagi, MD      . amLODipine (NORVASC) tablet 5 mg  5 mg Oral Daily Leandrew Koyanagi, MD   5 mg at 05/03/18 1054  . citalopram (CELEXA) tablet 20 mg  20 mg Oral Daily Starla Link, Kshitiz, MD   20 mg at 05/03/18 1054  . enoxaparin (LOVENOX) injection 40 mg  40 mg Subcutaneous Q24H Starla Link, Kshitiz, MD   40 mg at 05/03/18 1446  . feeding supplement (ENSURE ENLIVE) (ENSURE ENLIVE) liquid 237 mL  237 mL Oral BID BM  Ghimire, Henreitta Leber, MD      . folic acid (FOLVITE) tablet 1 mg  1 mg Oral Daily Leandrew Koyanagi, MD   1 mg at 05/03/18 1054  . Influenza vac split quadrivalent PF (FLUARIX) injection 0.5 mL  0.5 mL Intramuscular Tomorrow-1000 Leandrew Koyanagi, MD      . labetalol (NORMODYNE,TRANDATE) injection 10 mg  10 mg Intravenous Q2H PRN Leandrew Koyanagi, MD   10 mg at 04/20/18 2305  . losartan (COZAAR) tablet 100 mg  100 mg Oral Daily Leandrew Koyanagi, MD   100 mg at 05/03/18 1053  . MEDLINE mouth rinse  15 mL Mouth Rinse BID Leandrew Koyanagi, MD   15 mL at 05/02/18 2243  . multivitamin with minerals tablet 1 tablet  1 tablet  Oral Daily Leandrew Koyanagi, MD   1 tablet at 05/03/18 1053  . nicotine (NICODERM CQ - dosed in mg/24 hr) patch 7 mg  7 mg Transdermal Daily PRN Leandrew Koyanagi, MD      . pantoprazole (PROTONIX) EC tablet 40 mg  40 mg Oral Daily Leandrew Koyanagi, MD   40 mg at 05/03/18 1053  . polyethylene glycol (MIRALAX / GLYCOLAX) packet 17 g  17 g Oral Daily PRN Leandrew Koyanagi, MD      . tamsulosin [facility]) capsule 0.4 mg  0.4 mg Oral Daily Jonetta Osgood, MD   0.4 mg at 05/03/18 1054  . traMADol (ULTRAM) tablet 50 mg  50 mg Oral Q6H PRN Aline August, MD   50 mg at 05/03/18 1159     Discharge Medications: Please see discharge summary for a list of discharge medications.  Relevant Imaging Results:  Relevant Lab Results:   Additional Information SSN: [place], [place]

## 2018-05-03 NOTE — Progress Notes (Signed)
Nutrition Follow-up  INTERVENTION:   - Continue double portions on meal trays  - Continue snacks between meals  - Ensure Enlive po BID, each supplement provides 350 kcal and 20 grams of protein  - Continue MVI with minerals daily  - Liberalize diet to Regular (verbal with readback order placed per MD)  NUTRITION DIAGNOSIS:   Increased nutrient needs related to acute illness as evidenced by estimated needs.  Ongoing, being addressed via supplements, snacks, double portions  GOAL:   Patient will meet greater than or equal to 90% of their needs  Progressing  MONITOR:   PO intake, Supplement acceptance, Weight trends, Labs  REASON FOR ASSESSMENT:   Consult Assessment of nutrition requirement/status  ASSESSMENT:   63 y.o. male with medical history significant of depression, alcohol abuse, hypertension, reflux, hepatitis C presenting with desire to detox.   2/17 - s/p amputation of right 2nd toe, I & D of subcutaneous tissue of RL leg  Noted psychiatry no longer recommending pt transferring to inpatient psych unit at this point. Per MD note, plan is for pt to d/c to SNF for rehab. Pt has been discussing SNF with LCSW.  Last two weights are significantly lower than previous weights from this admission (134 lbs vs ~185 lbs). Suspect the two most recent weights are inaccurate. Unable to weight pt at time of visit due to bed positioning and back pain.  Spoke with pt at bedside. Pt reports that his appetite has "dropped off" over the last 2 meals. Pt believes this is due to "cramps in my back." Pt reports, however, that he loves Ensure supplements and would be willing to drink 2 daily instead of 1. RD to increase order to BID.  Pt placing dinner and breakfast orders at time of visit. Dinner included ham and vegetable flatbread pizza with dessert. Breakfast included eggs, bacon, and juice.  Spoke with MD regarding diet liberalization. MD approved.  Meal Completion: 50-100% x  last 7 meals  Medications reviewed and include: Ensure Enlive daily, folic acid, MVI with minerals, Protonix  Labs reviewed: hemoglobin 9.1 (L)  UOP: 1000 ml x 24 hours I/O's: -14.6 L since admit  Diet Order:   Diet Order            Diet regular Room service appropriate? Yes; Fluid consistency: Thin  Diet effective now        Diet - low sodium heart healthy              EDUCATION NEEDS:   Not appropriate for education at this time  Skin:  Skin Assessment: Skin Integrity Issues: DTI: sacrum Stage I: R toe Unstageable: R and L tibia, R pre-tibia, R toe  Last BM:  2/24  Height:   Ht Readings from Last 1 Encounters:  04/20/18 6' (1.829 m)    Weight:   Wt Readings from Last 1 Encounters:  05/03/18 61.2 kg    Ideal Body Weight:  80.91 kg  BMI:  Body mass index is 18.3 kg/m.  Estimated Nutritional Needs:   Kcal:  2000-2200 kcal  Protein:  100-110 grams  Fluid:  >/= 2 L/day    Gaynell Face, MS, RD, LDN Inpatient Clinical Dietitian Pager: [phone] Weekend/After Hours: [phone]

## 2018-05-04 NOTE — Progress Notes (Signed)
Physical Therapy Treatment Patient Details Name: Mark Ayala MRN: [id number] DOB: Feb 27, 1956 Today's Date: 05/04/2018    History of Present Illness 63 year old male with history of chronic hep C, hypertension, bipolar disorder, pulmonary hypertension, alcohol abuse admitted with alcohol intoxication with desire to detox.  Due to concern for SI was seen by psychiatry who recommended inpatient psychiatric admission when stable.  Underwent R second toe partial amputation on 04/25/2018 due to osteomyelitis.     PT Comments    Pt with acute onset left lower back pain. Initial part of session focused on gentle bilateral lower extremity manual stretching with muscle energy technique for relaxing tight muscles prior to mobility. Pt continuing to require significant assist for bed mobility, transfers, and ambulating 10 feet using walker and heavy bilateral upper extremity use. Pt has poor compliance to RLE weightbearing status - preferring to walk with right foot in plantarflexed position despite extensive visual and verbal cueing. SNF remains appropriate.     Follow Up Recommendations  Supervision/Assistance - 24 hour; SNF     Equipment Recommendations  Rolling walker with 5" wheels    Recommendations for Other Services       Precautions / Restrictions Precautions Precautions: Fall Other Brace: Darco shoe on R, left post op shoe (for height discrepancy) Restrictions Weight Bearing Restrictions: Yes RLE Weight Bearing: Partial weight bearing RLE Partial Weight Bearing Percentage or Pounds: (heel weightbearing through darco)    Mobility  Bed Mobility Overal bed mobility: Needs Assistance Bed Mobility: Supine to Sit;Sit to Supine     Supine to sit: Mod assist Sit to supine: Mod assist   General bed mobility comments: modA for trunk elevation out of bed and modA for BLE negotiation back into bed. Cues for log roll due to acute back pain  Transfers Overall transfer level: Needs  assistance Equipment used: Rolling walker (2 wheeled) Transfers: Sit to/from Stand Sit to Stand: Min assist         General transfer comment: min assist from significantly elevated bed height. cues for hand/foot placement  Ambulation/Gait Ambulation/Gait assistance: Min assist Gait Distance (Feet): 10 Feet Assistive device: Rolling walker (2 wheeled) Gait Pattern/deviations: Step-to pattern;Decreased dorsiflexion - right;Trunk flexed Gait velocity: decreased Gait velocity interpretation: <1.31 ft/sec, indicative of household ambulator General Gait Details: pt with right foot plantarflexed during mid stance despite visual and verbal cueing for heel weightbearing. verbal cues provided for sequencing, direction, upright posture   Stairs             Wheelchair Mobility    Modified Rankin (Stroke Patients Only)       Balance Overall balance assessment: Needs assistance Sitting-balance support: Single extremity supported;Feet unsupported Sitting balance-Leahy Scale: Good     Standing balance support: Bilateral upper extremity supported Standing balance-Leahy Scale: Poor                              Cognition Arousal/Alertness: Awake/alert Behavior During Therapy: WFL for tasks assessed/performed Overall Cognitive Status: No family/caregiver present to determine baseline cognitive functioning                                 General Comments: slow processing      Exercises Other Exercises Other Exercises: Supine: bilateral manual hamstring, piriformis, hip extensor stretch with muscle energy technique    General Comments        Pertinent Vitals/Pain Pain Assessment:  Faces Faces Pain Scale: Hurts even more Pain Location: left lower back Pain Descriptors / Indicators: Grimacing Pain Intervention(s): Monitored during session    Home Living                      Prior Function            PT Goals (current goals can now  be found in the care plan section) Acute Rehab PT Goals PT Goal Formulation: With patient Time For Goal Achievement: 05/18/18 Potential to Achieve Goals: Good Progress towards PT goals: Progressing toward goals    Frequency    Min 3X/week      PT Plan Current plan remains appropriate    Co-evaluation              AM-PAC PT "6 Clicks" Mobility   Outcome Measure  Help needed turning from your back to your side while in a flat bed without using bedrails?: None Help needed moving from lying on your back to sitting on the side of a flat bed without using bedrails?: A Lot Help needed moving to and from a bed to a chair (including a wheelchair)?: A Little Help needed standing up from a chair using your arms (e.g., wheelchair or bedside chair)?: A Lot Help needed to walk in hospital room?: A Little Help needed climbing 3-5 steps with a railing? : Total 6 Click Score: 15    End of Session Equipment Utilized During Treatment: Gait belt;Other (comment)(darco) Activity Tolerance: Patient tolerated treatment well Patient left: in bed;with call bell/phone within reach;with bed alarm set Nurse Communication: Mobility status PT Visit Diagnosis: Other abnormalities of gait and mobility (R26.89);History of falling (Z91.81)     Time: 1829-9371 PT Time Calculation (min) (ACUTE ONLY): 39 min  Charges:  $Gait Training: 8-22 mins $Therapeutic Exercise: 8-22 mins $Therapeutic Activity: 8-22 mins          Ellamae Sia, PT, DPT Acute Rehabilitation Services Pager [phone] Office [phone]    Willy Eddy 05/04/2018, 4:53 PM

## 2018-05-04 NOTE — Progress Notes (Signed)
SNF liaison spoke with patient. They are unwilling to accept patient if he does not have a home to return to. Patient provided permission to speak with his daughter, [name] [phone]. She reported that patient had lived with her for two month but she cannot handle him anymore. He went to an alcohol rehab but then got kicked out and was living on the streets. She states patient's brother, sister, and mother, have all given up on him and cannot house him. CSW explained difficulty of finding placement and she reported understanding. CSW will continue search.   Mark Locus Ruslan Mccabe LCSW [phone]

## 2018-05-04 NOTE — Progress Notes (Addendum)
PROGRESS NOTE        PATIENT DETAILS Name: Mark Ayala Age: 63 y.o. Sex: male Date of Birth: Sep 22, 1955 Admit Date: 04/20/2018 Admitting Physician A Melven Sartorius., MD [facility]:CWCB, [name], MD  Brief Narrative: Patient is a 63 y.o. male with history of alcohol use, recurrent ED visits for falls secondary to alcohol intoxication, bipolar disorder, chronic hepatitis C-presented with hyponatremia, alcohol withdrawal symptoms.  He then exhibited suicidal ideation, evaluated by psychiatry with recommendations to transfer to inpatient psychiatry when medically stable.  See below for further details  Subjective: Has no chest pain or shortness of breath.  Complains of some back pain.  Assessment/Plan: Right second toe osteomyelitis: Underwent right second toe amputation on 2/17, all antimicrobial therapy was discontinued on 2/18.  Orthopedics recommending heel weightbearing in Darco shoe to right lower extremity, wet-to-dry dressing changes twice daily.  Will need follow-up with orthopedics in 2 weeks for suture removal.  Alcohol withdrawal: Improved, and has completed a course of Librium taper.   Acute urinary retention: Continue Flomax-Foley catheter discontinued on 2/20-follow to see if patient can void.    Alcoholic hepatitis: LFTs have improved markedly-follow periodically.  Hypertension: Controlled-continue Cozaar and amlodipine.    Right lower extremity with scattered ulcerations: Present prior to admission-continue wound care per wound care team.  Hyponatremia: Improved-follow periodically.  Suicidal ideation: Evaluated by psychiatry service-initially plans were to transfer to inpatient psychiatry.  However patient has been somewhat stabilized while waiting for inpatient psychiatry bed.  He remains on Celexa.  Patient was reevaluated by psychiatry on 2/24, per psychiatry-patient does not need transfer to inpatient psych at this point.  Will need follow-up  with psychiatry as an outpatient.  Deconditioning: Secondary to acute illness-since he does not require transfer to inpatient psych-I have asked social worker to see if he can get him to SNF for subacute rehab  DVT Prophylaxis: Prophylactic Lovenox   Code Status: Full code   Family Communication: None at bedside  Disposition Plan: Remain inpatient  Antimicrobial agents: Anti-infectives (From admission, onward)   Start     Dose/Rate Route Frequency Ordered Stop   04/22/18 2100  vancomycin (VANCOCIN) 1,250 mg in sodium chloride 0.9 % 250 mL IVPB  Status:  Discontinued     1,250 mg 166.7 mL/hr over 90 Minutes Intravenous Every 12 hours 04/22/18 0820 04/26/18 1011   04/22/18 0900  ceFEPIme (MAXIPIME) 2 g in sodium chloride 0.9 % 100 mL IVPB  Status:  Discontinued     2 g 200 mL/hr over 30 Minutes Intravenous Every 8 hours 04/22/18 0759 04/26/18 1011   04/22/18 0900  vancomycin (VANCOCIN) 1,750 mg in sodium chloride 0.9 % 500 mL IVPB     1,750 mg 250 mL/hr over 120 Minutes Intravenous  Once 04/22/18 0759 04/22/18 1047      Procedures: 2/17>> 1.  Amputation of right second toe through PIP joint 2.  Adjacent tissue rearrangement right second toe 2 cm 3.  Irrigation debridement of subcutaneous tissue 2 cm  CONSULTS:  orthopedic surgery  Time spent: 15- minutes-Greater than 50% of this time was spent in counseling, explanation of diagnosis, planning of further management, and coordination of care.  MEDICATIONS: Scheduled Meds: . amLODipine  5 mg Oral Daily  . citalopram  20 mg Oral Daily  . enoxaparin (LOVENOX) injection  40 mg Subcutaneous Q24H  . feeding supplement (ENSURE  ENLIVE)  237 mL Oral BID BM  . folic acid  1 mg Oral Daily  . Influenza vac split quadrivalent PF  0.5 mL Intramuscular Tomorrow-1000  . losartan  100 mg Oral Daily  . mouth rinse  15 mL Mouth Rinse BID  . multivitamin with minerals  1 tablet Oral Daily  . pantoprazole  40 mg Oral Daily  .  tamsulosin  0.4 mg Oral Daily   Continuous Infusions: . sodium chloride Stopped (04/22/18 0847)   PRN Meds:.sodium chloride, acetaminophen **OR** acetaminophen, labetalol, nicotine, polyethylene glycol, traMADol   PHYSICAL EXAM: Vital signs: Vitals:   05/03/18 1421 05/03/18 2208 05/04/18 0602 05/04/18 0629  BP: 139/76 (!) 148/83 137/79   Pulse: 86 100 92   Resp:  18 19   Temp: 98 F (36.7 C) 100 F (37.8 C) 98.2 F (36.8 C)   TempSrc:  Oral    SpO2: 96% 99% 92%   Weight:    78.7 kg  Height:       Filed Weights   05/02/18 0447 05/03/18 0443 05/04/18 0629  Weight: 61.1 kg 61.2 kg 78.7 kg   Body mass index is 23.53 kg/m.   General appearance:Awake, alert, not in any distress.  Eyes:no scleral icterus. HEENT: Atraumatic and Normocephalic Neck: supple, no JVD. Resp:Good air entry bilaterally,no rales or rhonchi CVS: S1 S2 regular, no murmurs.  GI: Bowel sounds present, Non tender and not distended with no gaurding, rigidity or rebound. Extremities: B/L Lower Ext shows no edema, both legs are warm to touch Neurology:  Non focal  I have personally reviewed following labs and imaging studies  LABORATORY DATA: CBC: No results for input(s): WBC, NEUTROABS, HGB, HCT, MCV, PLT in the last 168 hours.  Basic Metabolic Panel: Recent Labs  Lab 04/30/18 0844  NA 136  K 4.2  CL 103  CO2 27  GLUCOSE 101*  BUN 17  CREATININE 0.98  CALCIUM 8.6*    GFR: Estimated Creatinine Clearance: 85.8 mL/min (by C-G formula based on SCr of 0.98 mg/dL).  Liver Function Tests: Recent Labs  Lab 04/28/18 0426 04/30/18 0844  AST 65* 66*  ALT 34 33  ALKPHOS 80 83  BILITOT 1.1 0.9  PROT 6.0* 6.0*  ALBUMIN 2.1* 2.2*   No results for input(s): LIPASE, AMYLASE in the last 168 hours. No results for input(s): AMMONIA in the last 168 hours.  Coagulation Profile: No results for input(s): INR, PROTIME in the last 168 hours.  Cardiac Enzymes: No results for input(s): CKTOTAL, CKMB,  CKMBINDEX, TROPONINI in the last 168 hours.  BNP (last 3 results) No results for input(s): PROBNP in the last 8760 hours.  HbA1C: No results for input(s): HGBA1C in the last 72 hours.  CBG: No results for input(s): GLUCAP in the last 168 hours.  Lipid Profile: No results for input(s): CHOL, HDL, LDLCALC, TRIG, CHOLHDL, LDLDIRECT in the last 72 hours.  Thyroid Function Tests: No results for input(s): TSH, T4TOTAL, FREET4, T3FREE, THYROIDAB in the last 72 hours.  Anemia Panel: No results for input(s): VITAMINB12, FOLATE, FERRITIN, TIBC, IRON, RETICCTPCT in the last 72 hours.  Urine analysis:    Component Value Date/Time   COLORURINE STRAW (A) 04/20/2018 0850   APPEARANCEUR CLEAR 04/20/2018 0850   LABSPEC 1.004 (L) 04/20/2018 0850   PHURINE 6.0 04/20/2018 0850   GLUCOSEU NEGATIVE 04/20/2018 0850   HGBUR MODERATE (A) 04/20/2018 0850   BILIRUBINUR NEGATIVE 04/20/2018 0850   KETONESUR NEGATIVE 04/20/2018 0850   PROTEINUR NEGATIVE 04/20/2018 0850   NITRITE  NEGATIVE 04/20/2018 0850   LEUKOCYTESUR NEGATIVE 04/20/2018 0850    Sepsis Labs: Lactic Acid, Venous No results found for: LATICACIDVEN  MICROBIOLOGY: Recent Results (from the past 240 hour(s))  Surgical pcr screen     Status: None   Collection Time: 04/25/18  1:54 AM  Result Value Ref Range Status   MRSA, PCR NEGATIVE NEGATIVE Final   Staphylococcus aureus NEGATIVE NEGATIVE Final    Comment: (NOTE) The Xpert SA Assay (FDA approved for NASAL specimens in patients 6 years of age and older), is one component of a comprehensive surveillance program. It is not intended to diagnose infection nor to guide or monitor treatment. Performed at [facility], [place] [address] [place], [place] [address]     RADIOLOGY STUDIES/RESULTS: Mr Dellie Catholic Right Wo Contrast  Result Date: 04/21/2018 CLINICAL DATA:  Right second toe redness, pain and swelling. Skin ulcerations. EXAM: MRI OF THE RIGHT TOES WITHOUT CONTRAST TECHNIQUE:  Multiplanar, multisequence MR imaging of the right foot was performed. No intravenous contrast was administered. COMPARISON:  Plain films right foot 04/20/2018. FINDINGS: Patient motion degrades the study. Bones/Joint/Cartilage There is marrow edema in the distal phalanx of the second toe worrisome for osteomyelitis. Hallux valgus is noted. The patient has some osteoarthritic change about the first and second MTP joints. Subchondral edema in the head of the second metatarsal is noted. Ligaments Appear intact. Muscles and Tendons There is some atrophy of intrinsic musculature of the foot. No intramuscular fluid collection. Soft tissues Subcutaneous edema is seen over the dorsum of the foot. No joint effusion or abscess. IMPRESSION: Motion degraded examination demonstrates marrow edema in the distal phalanx of the second toe most consistent with osteomyelitis. Negative for abscess, myositis or septic joint. 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 Hallux valgus. Electronically Signed   By: Inge Rise M.D.   On: 04/21/2018 16:00   Dg Foot Complete Right  Result Date: 04/20/2018 CLINICAL DATA:  Swollen right foot.  No specific injury. EXAM: RIGHT FOOT COMPLETE - 3+ VIEW COMPARISON:  None. FINDINGS: Diffuse soft tissue swelling is noted, mainly along the dorsum of the foot. There are mild degenerative changes but no acute bony findings or destructive bony changes. Degenerative changes at the first MTP joint with hallux valgus deformity. Small calcaneal heel spur. IMPRESSION: Mild degenerative changes but no acute bony findings. Electronically Signed   By: Marijo Sanes M.D.   On: 04/20/2018 11:31   Vas [place] Burnard Bunting With/wo Tbi  Result Date: 04/20/2018 LOWER EXTREMITY DOPPLER STUDY Indications: Ulceration, and peripheral artery disease.  Performing Technologist: Birdena Crandall, [place] [facility]  Examination Guidelines: A complete evaluation includes at minimum, Doppler waveform signals  and systolic blood pressure reading at the level of bilateral brachial, anterior tibial, and posterior tibial arteries, when vessel segments are accessible. Bilateral testing is considered an integral part of a complete examination. Photoelectric Plethysmograph (PPG) waveforms and toe systolic pressure readings are included as required and additional duplex testing as needed. Limited examinations for reoccurring indications may be performed as noted.  ABI Findings: +---------+------------------+-----+---------+---------------------------------+ Right    Rt Pressure (mmHg)IndexWaveform Comment                           +---------+------------------+-----+---------+---------------------------------+ Brachial 180                    triphasic                                  +---------+------------------+-----+---------+---------------------------------+  PTA      246               1.28 biphasic                                   +---------+------------------+-----+---------+---------------------------------+ DP       245               1.28 biphasic                                   +---------+------------------+-----+---------+---------------------------------+ Great Toe                                Unable to obtain due to constant                                           involuntary movement of the foot  +---------+------------------+-----+---------+---------------------------------+ +---------+------------------+-----+----------+--------------------------------+ Left     Lt Pressure (mmHg)IndexWaveform  Comment                          +---------+------------------+-----+----------+--------------------------------+ Brachial 192                    triphasic                                  +---------+------------------+-----+----------+--------------------------------+ PTA      221               1.15 monophasic                                  +---------+------------------+-----+----------+--------------------------------+ DP       199               1.04 monophasic                                 +---------+------------------+-----+----------+--------------------------------+ Great Toe                                 Unable to obtan due to constan                                             involuntary movement of the foot +---------+------------------+-----+----------+--------------------------------+ +-------+-----------+--------------------------------+------------+------------+ ABI/TBIToday's ABIToday's TBI                     Previous ABIPrevious TBI +-------+-----------+--------------------------------+------------+------------+ Right  1.28       Unable to obtain see comment                                               above                                                    +-------+-----------+--------------------------------+------------+------------+  Left   1.15       Unable to obtain see comment                                               above                                                    +-------+-----------+--------------------------------+------------+------------+  Summary: Right: Resting right ankle-brachial index is within normal range. No evidence of significant right lower extremity arterial disease. Left: Resting left ankle-brachial index is within normal range. No evidence of significant left lower extremity arterial disease.  *See table(s) above for measurements and observations.  Electronically signed by Curt Jews MD on 04/20/2018 at 3:09:22 PM.    Final    Dg Hip Unilat With Pelvis 2-3 Views Left  Result Date: 04/20/2018 CLINICAL DATA:  Left hip region pain EXAM: DG HIP (WITH OR WITHOUT PELVIS) 2-3V LEFT COMPARISON:  None. FINDINGS: Frontal pelvis as well as frontal and lateral left hip images were obtained. There is no acute fracture or dislocation. There is marked  narrowing of the left hip joint. There are cystic changes in the left femoral head with areas of intermingled sclerosis and slight cortical irregularity. There is moderate narrowing of the right hip joint. Sacroiliac joints appear normal bilaterally. No bony destruction. There are soft tissue calcifications in the midline perineal region. 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 There is milder osteoarthritic change in the right hip joint. No fracture or dislocation. Small calcifications in the midline perineum, likely due to chronic inflammation. Electronically Signed   By: Lowella Grip III M.D.   On: 04/20/2018 10:02     LOS: 14 days   Oren Binet, MD  [facility]  If 7PM-7AM, please contact night-coverage  Please page via [email or web address]  Go to [email or web address] and use [facility] universal password to access. If you do not have the password, please contact the hospital operator.  Locate the [facility] provider you are looking for under [facility] and page to a number that you can be directly reached. If you still have difficulty reaching the provider, please page the [facility] (Director on Call) for the Hospitalists listed on amion for assistance.  05/04/2018, 9:44 AM

## 2018-05-05 NOTE — Progress Notes (Signed)
PROGRESS NOTE        PATIENT DETAILS Name: Mark Ayala Age: 63 y.o. Sex: male Date of Birth: Jan 10, 1956 Admit Date: 04/20/2018 Admitting Physician A Melven Sartorius., MD WLN:[id number], [name], MD  Brief Narrative: Patient is a 63 y.o. male with history of alcohol use, recurrent ED visits for falls secondary to alcohol intoxication, bipolar disorder, chronic hepatitis C-presented with hyponatremia, alcohol withdrawal symptoms.  He then exhibited suicidal ideation, evaluated by psychiatry with recommendations to transfer to inpatient psychiatry when medically stable.  See below for further details  Subjective: Denies any chest pain or shortness of breath-interested in going to SNF.  Assessment/Plan: Right second toe osteomyelitis: Underwent right second toe amputation on 2/17, all antimicrobial therapy was discontinued on 2/18.  Orthopedics recommending heel weightbearing in Darco shoe to right lower extremity, wet-to-dry dressing changes twice daily.  Will need follow-up with orthopedics in 2 weeks for suture removal.  Alcohol withdrawal: Improved, and has completed a course of Librium taper.   Acute urinary retention: Continue Flomax-Foley catheter discontinued on 2/20-voiding without difficulty  Alcoholic hepatitis: LFTs have improved markedly-follow periodically.  Hypertension: Controlled-continue Cozaar and amlodipine  Right lower extremity with scattered ulcerations: Present prior to admission-continue wound care per wound care team.  Hyponatremia: Improved-follow periodically.  Suicidal ideation: Evaluated by psychiatry service-initially plans were to transfer to inpatient psychiatry.  However patient has been somewhat stabilized while waiting for inpatient psychiatry bed.  He remains on Celexa.  Patient was reevaluated by psychiatry on 2/24, per psychiatry-patient does not need transfer to inpatient psych at this point.  Will need follow-up with psychiatry  as an outpatient.  Deconditioning: Secondary to acute illness-since he does not require transfer to inpatient psych-I have asked social worker to see if he can get him to SNF for subacute rehab  DVT Prophylaxis: Prophylactic Lovenox   Code Status: Full code   Family Communication: None at bedside  Disposition Plan: Remain inpatient  Antimicrobial agents: Anti-infectives (From admission, onward)   Start     Dose/Rate Route Frequency Ordered Stop   04/22/18 2100  vancomycin (VANCOCIN) 1,250 mg in sodium chloride 0.9 % 250 mL IVPB  Status:  Discontinued     1,250 mg 166.7 mL/hr over 90 Minutes Intravenous Every 12 hours 04/22/18 0820 04/26/18 1011   04/22/18 0900  ceFEPIme (MAXIPIME) 2 g in sodium chloride 0.9 % 100 mL IVPB  Status:  Discontinued     2 g 200 mL/hr over 30 Minutes Intravenous Every 8 hours 04/22/18 0759 04/26/18 1011   04/22/18 0900  vancomycin (VANCOCIN) 1,750 mg in sodium chloride 0.9 % 500 mL IVPB     1,750 mg 250 mL/hr over 120 Minutes Intravenous  Once 04/22/18 0759 04/22/18 1047      Procedures: 2/17>> 1.  Amputation of right second toe through PIP joint 2.  Adjacent tissue rearrangement right second toe 2 cm 3.  Irrigation debridement of subcutaneous tissue 2 cm  CONSULTS:  orthopedic surgery  Time spent: 15- minutes-Greater than 50% of this time was spent in counseling, explanation of diagnosis, planning of further management, and coordination of care.  MEDICATIONS: Scheduled Meds: . amLODipine  5 mg Oral Daily  . citalopram  20 mg Oral Daily  . enoxaparin (LOVENOX) injection  40 mg Subcutaneous Q24H  . feeding supplement (ENSURE ENLIVE)  237 mL Oral BID BM  . folic  acid  1 mg Oral Daily  . Influenza vac split quadrivalent PF  0.5 mL Intramuscular Tomorrow-1000  . losartan  100 mg Oral Daily  . mouth rinse  15 mL Mouth Rinse BID  . multivitamin with minerals  1 tablet Oral Daily  . pantoprazole  40 mg Oral Daily  . tamsulosin  0.4 mg Oral  Daily   Continuous Infusions: . sodium chloride Stopped (04/22/18 0847)   PRN Meds:.sodium chloride, acetaminophen **OR** acetaminophen, labetalol, nicotine, polyethylene glycol, traMADol   PHYSICAL EXAM: Vital signs: Vitals:   05/04/18 2214 05/05/18 0650 05/05/18 0651 05/05/18 1249  BP: (!) 150/86 137/79  140/78  Pulse: 91 89  78  Resp: 19 17  19   Temp: 98.7 F (37.1 C) 98.6 F (37 C)  97.6 F (36.4 C)  TempSrc: Oral   Oral  SpO2: 94% 93%  99%  Weight:   81 kg   Height:       Filed Weights   05/03/18 0443 05/04/18 0629 05/05/18 0651  Weight: 61.2 kg 78.7 kg 81 kg   Body mass index is 24.22 kg/m.   General appearance:Awake, alert, not in any distress.  Eyes:no scleral icterus. HEENT: Atraumatic and Normocephalic Neck: supple, no JVD. Resp:Good air entry bilaterally,no rales or rhonchi CVS: S1 S2 regular, no murmurs.  GI: Bowel sounds present, Non tender and not distended with no gaurding, rigidity or rebound. Extremities: B/L Lower Ext shows no edema, both legs are warm to touch Neurology:  Non focal  I have personally reviewed following labs and imaging studies  LABORATORY DATA: CBC: No results for input(s): WBC, NEUTROABS, HGB, HCT, MCV, PLT in the last 168 hours.  Basic Metabolic Panel: Recent Labs  Lab 04/30/18 0844  NA 136  K 4.2  CL 103  CO2 27  GLUCOSE 101*  BUN 17  CREATININE 0.98  CALCIUM 8.6*    GFR: Estimated Creatinine Clearance: 85.8 mL/min (by C-G formula based on SCr of 0.98 mg/dL).  Liver Function Tests: Recent Labs  Lab 04/30/18 0844  AST 66*  ALT 33  ALKPHOS 83  BILITOT 0.9  PROT 6.0*  ALBUMIN 2.2*   No results for input(s): LIPASE, AMYLASE in the last 168 hours. No results for input(s): AMMONIA in the last 168 hours.  Coagulation Profile: No results for input(s): INR, PROTIME in the last 168 hours.  Cardiac Enzymes: No results for input(s): CKTOTAL, CKMB, CKMBINDEX, TROPONINI in the last 168 hours.  BNP (last 3  results) No results for input(s): PROBNP in the last 8760 hours.  HbA1C: No results for input(s): HGBA1C in the last 72 hours.  CBG: No results for input(s): GLUCAP in the last 168 hours.  Lipid Profile: No results for input(s): CHOL, HDL, LDLCALC, TRIG, CHOLHDL, LDLDIRECT in the last 72 hours.  Thyroid Function Tests: No results for input(s): TSH, T4TOTAL, FREET4, T3FREE, THYROIDAB in the last 72 hours.  Anemia Panel: No results for input(s): VITAMINB12, FOLATE, FERRITIN, TIBC, IRON, RETICCTPCT in the last 72 hours.  Urine analysis:    Component Value Date/Time   COLORURINE STRAW (A) 04/20/2018 0850   APPEARANCEUR CLEAR 04/20/2018 0850   LABSPEC 1.004 (L) 04/20/2018 0850   PHURINE 6.0 04/20/2018 0850   GLUCOSEU NEGATIVE 04/20/2018 0850   HGBUR MODERATE (A) 04/20/2018 0850   BILIRUBINUR NEGATIVE 04/20/2018 0850   KETONESUR NEGATIVE 04/20/2018 0850   PROTEINUR NEGATIVE 04/20/2018 0850   NITRITE NEGATIVE 04/20/2018 0850   LEUKOCYTESUR NEGATIVE 04/20/2018 0850    Sepsis Labs: Lactic Acid, Venous No  results found for: LATICACIDVEN  MICROBIOLOGY: No results found for this or any previous visit (from the past 240 hour(s)).  RADIOLOGY STUDIES/RESULTS: Mr Toes Right Wo Contrast  Result Date: 04/21/2018 CLINICAL DATA:  Right second toe redness, pain and swelling. Skin ulcerations. EXAM: MRI OF THE RIGHT TOES WITHOUT CONTRAST TECHNIQUE: Multiplanar, multisequence MR imaging of the right foot was performed. No intravenous contrast was administered. COMPARISON:  Plain films right foot 04/20/2018. FINDINGS: Patient motion degrades the study. Bones/Joint/Cartilage There is marrow edema in the distal phalanx of the second toe worrisome for osteomyelitis. Hallux valgus is noted. The patient has some osteoarthritic change about the first and second MTP joints. Subchondral edema in the head of the second metatarsal is noted. Ligaments Appear intact. Muscles and Tendons There is some atrophy  of intrinsic musculature of the foot. No intramuscular fluid collection. Soft tissues Subcutaneous edema is seen over the dorsum of the foot. No joint effusion or abscess. IMPRESSION: Motion degraded examination demonstrates marrow edema in the distal phalanx of the second toe most consistent with osteomyelitis. Negative for abscess, myositis or septic joint. 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 Hallux valgus. Electronically Signed   By: Inge Rise M.D.   On: 04/21/2018 16:00   Dg Foot Complete Right  Result Date: 04/20/2018 CLINICAL DATA:  Swollen right foot.  No specific injury. EXAM: RIGHT FOOT COMPLETE - 3+ VIEW COMPARISON:  None. FINDINGS: Diffuse soft tissue swelling is noted, mainly along the dorsum of the foot. There are mild degenerative changes but no acute bony findings or destructive bony changes. Degenerative changes at the first MTP joint with hallux valgus deformity. Small calcaneal heel spur. IMPRESSION: Mild degenerative changes but no acute bony findings. Electronically Signed   By: Marijo Sanes M.D.   On: 04/20/2018 11:31   Vas [place] Burnard Bunting With/wo Tbi  Result Date: 04/20/2018 LOWER EXTREMITY DOPPLER STUDY Indications: Ulceration, and peripheral artery disease.  Performing Technologist: Birdena Crandall, [place] [facility]  Examination Guidelines: A complete evaluation includes at minimum, Doppler waveform signals and systolic blood pressure reading at the level of bilateral brachial, anterior tibial, and posterior tibial arteries, when vessel segments are accessible. Bilateral testing is considered an integral part of a complete examination. Photoelectric Plethysmograph (PPG) waveforms and toe systolic pressure readings are included as required and additional duplex testing as needed. Limited examinations for reoccurring indications may be performed as noted.  ABI Findings: +---------+------------------+-----+---------+---------------------------------+ Right     Rt Pressure (mmHg)IndexWaveform Comment                           +---------+------------------+-----+---------+---------------------------------+ Brachial 180                    triphasic                                  +---------+------------------+-----+---------+---------------------------------+ PTA      246               1.28 biphasic                                   +---------+------------------+-----+---------+---------------------------------+ DP       245               1.28 biphasic                                   +---------+------------------+-----+---------+---------------------------------+  Great Toe                                Unable to obtain due to constant                                           involuntary movement of the foot  +---------+------------------+-----+---------+---------------------------------+ +---------+------------------+-----+----------+--------------------------------+ Left     Lt Pressure (mmHg)IndexWaveform  Comment                          +---------+------------------+-----+----------+--------------------------------+ Brachial 192                    triphasic                                  +---------+------------------+-----+----------+--------------------------------+ PTA      221               1.15 monophasic                                 +---------+------------------+-----+----------+--------------------------------+ DP       199               1.04 monophasic                                 +---------+------------------+-----+----------+--------------------------------+ Great Toe                                 Unable to obtan due to constan                                             involuntary movement of the foot +---------+------------------+-----+----------+--------------------------------+ +-------+-----------+--------------------------------+------------+------------+  ABI/TBIToday's ABIToday's TBI                     Previous ABIPrevious TBI +-------+-----------+--------------------------------+------------+------------+ Right  1.28       Unable to obtain see comment                                               above                                                    +-------+-----------+--------------------------------+------------+------------+ Left   1.15       Unable to obtain see comment                                               above                                                    +-------+-----------+--------------------------------+------------+------------+  Summary: Right: Resting right ankle-brachial index is within normal range. No evidence of significant right lower extremity arterial disease. Left: Resting left ankle-brachial index is within normal range. No evidence of significant left lower extremity arterial disease.  *See table(s) above for measurements and observations.  Electronically signed by Curt Jews MD on 04/20/2018 at 3:09:22 PM.    Final    Dg Hip Unilat With Pelvis 2-3 Views Left  Result Date: 04/20/2018 CLINICAL DATA:  Left hip region pain EXAM: DG HIP (WITH OR WITHOUT PELVIS) 2-3V LEFT COMPARISON:  None. FINDINGS: Frontal pelvis as well as frontal and lateral left hip images were obtained. There is no acute fracture or dislocation. There is marked narrowing of the left hip joint. There are cystic changes in the left femoral head with areas of intermingled sclerosis and slight cortical irregularity. There is moderate narrowing of the right hip joint. Sacroiliac joints appear normal bilaterally. No bony destruction. There are soft tissue calcifications in the midline perineal region. 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 There is milder osteoarthritic change in the right hip joint. No fracture or  dislocation. Small calcifications in the midline perineum, likely due to chronic inflammation. Electronically Signed   By: Lowella Grip III M.D.   On: 04/20/2018 10:02     LOS: 15 days   Oren Binet, MD  [facility]  If 7PM-7AM, please contact night-coverage  Please page via [email or web address]  Go to [email or web address] and use [facility] universal password to access. If you do not have the password, please contact the hospital operator.  Locate the [facility] provider you are looking for under [facility] and page to a number that you can be directly reached. If you still have difficulty reaching the provider, please page the [facility] (Director on Call) for the Hospitalists listed on amion for assistance.  05/05/2018, 1:33 PM

## 2018-05-05 NOTE — Progress Notes (Signed)
Physical Therapy Treatment Patient Details Name: Mark Ayala MRN: [id number] DOB: [birth year] Today's Date: 05/05/2018    History of Present Illness 63 year old male with history of chronic hep C, hypertension, bipolar disorder, pulmonary hypertension, alcohol abuse admitted with alcohol intoxication with desire to detox.  Due to concern for SI was seen by psychiatry who recommended inpatient psychiatric admission when stable.  Underwent R second toe partial amputation on 04/25/2018 due to osteomyelitis.     PT Comments    Pt presented in bed asleep. Pt agreed to transfer training and therex. Pt tolerated interventions well but continued to need max cueing to avoid putting weight through his toes on RLE. Pt is progressing slowly towards understanding his PWB status, but fatigue and cognition is a major limiting factor for his adherence. Pt stated his back pain was a limiting factor d/t fear of causing pain during transfers. Pt required max vc/tc to sit hips back and complete sit from stand transfer. Pt left with call bell/phone, all needs within reach, and chair alarm on. Pt discharge plan to SNF is still appropriate at this time d/t current progress. Plan to progress with transfer training and progress back to gait training as pt better adheres to precautions.   Follow Up Recommendations  Supervision/Assistance - 24 hour;SNF     Equipment Recommendations  Rolling walker with 5" wheels    Recommendations for Other Services       Precautions / Restrictions Precautions Precautions: Fall Required Braces or Orthoses: Other Brace Other Brace: Darco shoe on R, left post op shoe (for height discrepancy) Restrictions Weight Bearing Restrictions: Yes RLE Weight Bearing: Partial weight bearing Other Position/Activity Restrictions: heel weight bearing in Darco shoe    Mobility  Bed Mobility Overal bed mobility: Needs Assistance Bed Mobility: Rolling;Sidelying to Sit Rolling: Min  assist Sidelying to sit: Min assist;HOB elevated          Transfers Overall transfer level: Needs assistance Equipment used: Rolling walker (2 wheeled) Transfers: Sit to/from Stand Sit to Stand: Min assist         General transfer comment: min assist from significantly elevated bed height. cues for hand/foot placement  Ambulation/Gait Ambulation/Gait assistance: Min assist Gait Distance (Feet): 10 Feet(5 forward and 5 back) Assistive device: Rolling walker (2 wheeled) Gait Pattern/deviations: Step-to pattern;Decreased dorsiflexion - right;Trunk flexed Gait velocity: decreased   General Gait Details: pt with right foot plantarflexed during mid stance despite visual and verbal cueing for heel weightbearing. verbal cues provided for sequencing, direction, upright posture, amd backing cues given as well   Stairs             Wheelchair Mobility    Modified Rankin (Stroke Patients Only)       Balance Overall balance assessment: Needs assistance Sitting-balance support: Single extremity supported;Feet unsupported Sitting balance-Leahy Scale: Good     Standing balance support: Bilateral upper extremity supported Standing balance-Leahy Scale: Poor Standing balance comment: Pt with initial forward flex in standing w/ max cues/mod A for correction.                             Cognition Arousal/Alertness: Awake/alert Behavior During Therapy: WFL for tasks assessed/performed Overall Cognitive Status: No family/caregiver present to determine baseline cognitive functioning                                 General Comments: slow processing  Exercises General Exercises - Lower Extremity Ankle Circles/Pumps: AROM;15 reps;Both;Supine Quad Sets: AROM;15 reps;Both;Supine Heel Slides: 15 reps;AROM;Both;Supine Hip ABduction/ADduction: AROM;15 reps;Both;Supine Straight Leg Raises: 10 reps;AROM;Supine;Both    General Comments         Pertinent Vitals/Pain Faces Pain Scale: Hurts little more Pain Location: left lower back Pain Descriptors / Indicators: Grimacing    Home Living                      Prior Function            PT Goals (current goals can now be found in the care plan section) Acute Rehab PT Goals Patient Stated Goal: none stated PT Goal Formulation: With patient Potential to Achieve Goals: Good    Frequency    Min 3X/week      PT Plan Current plan remains appropriate    Co-evaluation              AM-PAC PT "6 Clicks" Mobility   Outcome Measure  Help needed turning from your back to your side while in a flat bed without using bedrails?: None Help needed moving from lying on your back to sitting on the side of a flat bed without using bedrails?: A Lot Help needed moving to and from a bed to a chair (including a wheelchair)?: A Little Help needed standing up from a chair using your arms (e.g., wheelchair or bedside chair)?: A Lot Help needed to walk in hospital room?: A Little Help needed climbing 3-5 steps with a railing? : Total 6 Click Score: 15    End of Session Equipment Utilized During Treatment: Gait belt;Other (comment) Activity Tolerance: Patient tolerated treatment well Patient left: with call bell/phone within reach;in chair;with chair alarm set Nurse Communication: Mobility status;Patient requests pain meds PT Visit Diagnosis: Other abnormalities of gait and mobility (R26.89);History of falling (Z91.81)     Time: 1324-4010 PT Time Calculation (min) (ACUTE ONLY): 23 min  Charges:  $Gait Training: 8-22 mins $Therapeutic Exercise: 8-22 mins                     Mark Ayala, SPTA   Mark Ayala 05/05/2018, 4:51 PM

## 2018-05-05 NOTE — Progress Notes (Signed)
CSW faxed referral to [facility]. Still awaiting response from [facility].   Mark [name] LCSW [phone]

## 2018-05-06 MED ORDER — CYCLOBENZAPRINE HCL 5 MG PO TABS
5.0000 mg | ORAL_TABLET | Freq: Three times a day (TID) | ORAL | Status: DC | PRN
  Administered 2018-05-06: 5 mg via ORAL
  Filled 2018-05-06: qty 1

## 2018-05-06 MED ORDER — CYCLOBENZAPRINE HCL 5 MG PO TABS
5.0000 mg | ORAL_TABLET | Freq: Three times a day (TID) | ORAL | Status: DC | PRN
  Administered 2018-05-07 – 2018-05-11 (×6): 5 mg via ORAL
  Filled 2018-05-06 (×6): qty 1

## 2018-05-06 MED ORDER — LIDOCAINE 5 % EX PTCH
1.0000 | MEDICATED_PATCH | CUTANEOUS | Status: DC
  Administered 2018-05-06 – 2018-05-17 (×12): 1 via TRANSDERMAL
  Filled 2018-05-06 (×12): qty 1

## 2018-05-06 MED ORDER — OXYCODONE HCL 5 MG PO TABS
5.0000 mg | ORAL_TABLET | ORAL | Status: DC | PRN
  Administered 2018-05-15 – 2018-05-17 (×5): 5 mg via ORAL
  Filled 2018-05-06 (×5): qty 1

## 2018-05-06 NOTE — Progress Notes (Signed)
4:30pm-[facility] has declined patient.   11am-CSW discussed case with CSW AD. He suggested to send referral to [facility]. CSW faxed referral.   Cedric Fishman LCSW [phone]

## 2018-05-06 NOTE — Progress Notes (Signed)
Physical Therapy Treatment Patient Details Name: Mark Ayala MRN: [id number] DOB: 1955/12/01 Today's Date: 05/06/2018    History of Present Illness 63 year old male with history of chronic hep C, hypertension, bipolar disorder, pulmonary hypertension, alcohol abuse admitted with alcohol intoxication with desire to detox.  Due to concern for SI was seen by psychiatry who recommended inpatient psychiatric admission when stable.  Underwent R second toe partial amputation on 04/25/2018 due to osteomyelitis.     PT Comments    Pt presented in bed with coffee spilled on self and bed. Pt limited d/t pain and agitation today. Pt took multiple attempts and encouragement to rise from EOB. Pt required more assistance overall today and was unable to take steps for ambulation forward out of the room. Pt required recliner to be pushed up behind him and he was able to take 2 steps backward to line up with chair. Pt required max A to facilitate sit from stand as pt would not let go of walker. Pt left in recliner with call bell/phone within reach, and all other needs available. Plan to d/c to SNF appropriate at this time based on progress. Plan to progress gait training as tolerated and able to adhere to PWB status.    Follow Up Recommendations  Supervision/Assistance - 24 hour;SNF     Equipment Recommendations  Rolling walker with 5" wheels    Recommendations for Other Services       Precautions / Restrictions Precautions Precautions: Fall Required Braces or Orthoses: Other Brace Other Brace: Darco shoe on R, left post op shoe (for height discrepancy) Restrictions Weight Bearing Restrictions: Yes RLE Weight Bearing: Partial weight bearing Other Position/Activity Restrictions: heel weight bearing in Darco shoe    Mobility  Bed Mobility Overal bed mobility: Needs Assistance Bed Mobility: Rolling;Sidelying to Sit Rolling: Mod assist Sidelying to sit: Mod assist;HOB elevated       General bed  mobility comments: modA for trunk elevation out of bed and modA for BLE negotiation. Cues for log roll due to acute back pain  Transfers Overall transfer level: Needs assistance Equipment used: Rolling walker (2 wheeled) Transfers: Sit to/from Stand Sit to Stand: Mod assist         General transfer comment: Pt required increased assistance to power up from sit to stand. Pt c/o of pain and global stiffness.   Ambulation/Gait             General Gait Details: unable to complete at this time   Stairs             Wheelchair Mobility    Modified Rankin (Stroke Patients Only)       Balance Overall balance assessment: Needs assistance Sitting-balance support: Single extremity supported;Feet unsupported Sitting balance-Leahy Scale: Good     Standing balance support: Bilateral upper extremity supported Standing balance-Leahy Scale: Poor Standing balance comment: Pt with initial forward flex in standing w/ max cues/mod A for correction.                             Cognition Arousal/Alertness: Awake/alert Behavior During Therapy: Agitated Overall Cognitive Status: No family/caregiver present to determine baseline cognitive functioning                                 General Comments: slow processing      Exercises      General Comments  Pertinent Vitals/Pain Faces Pain Scale: Hurts even more Pain Location: lower back Pain Descriptors / Indicators: Grimacing    Home Living Family/patient expects to be discharged to:: Skilled nursing facility                    Prior Function Level of Independence: Independent with assistive device(s)      Comments: used cane and has had multiple falls   PT Goals (current goals can now be found in the care plan section) Acute Rehab PT Goals Patient Stated Goal: none stated PT Goal Formulation: With patient Potential to Achieve Goals: Good    Frequency    Min  3X/week      PT Plan Current plan remains appropriate    Co-evaluation              AM-PAC PT "6 Clicks" Mobility   Outcome Measure  Help needed turning from your back to your side while in a flat bed without using bedrails?: None Help needed moving from lying on your back to sitting on the side of a flat bed without using bedrails?: A Lot Help needed moving to and from a bed to a chair (including a wheelchair)?: A Little Help needed standing up from a chair using your arms (e.g., wheelchair or bedside chair)?: A Lot Help needed to walk in hospital room?: A Little Help needed climbing 3-5 steps with a railing? : Total 6 Click Score: 15    End of Session Equipment Utilized During Treatment: Gait belt;Other (comment) Activity Tolerance: Patient limited by pain Patient left: with call bell/phone within reach;in chair;with chair alarm set Nurse Communication: Mobility status;Patient requests pain meds PT Visit Diagnosis: Other abnormalities of gait and mobility (R26.89);History of falling (Z91.81)     Time: 0938-1829 PT Time Calculation (min) (ACUTE ONLY): 24 min  Charges:  $Therapeutic Activity: 23-37 mins                     Maryelizabeth Kaufmann, SPTA   Maryelizabeth Kaufmann 05/06/2018, 5:02 PM
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
## 2018-05-07 LAB — CBC
HCT: 30.6 % — ABNORMAL LOW (ref 39.0–52.0)
Hemoglobin: 9.9 g/dL — ABNORMAL LOW (ref 13.0–17.0)
MCH: 33.7 pg (ref 26.0–34.0)
MCHC: 32.4 g/dL (ref 30.0–36.0)
MCV: 104.1 fL — ABNORMAL HIGH (ref 80.0–100.0)
Platelets: 247 10*3/uL (ref 150–400)
RBC: 2.94 MIL/uL — ABNORMAL LOW (ref 4.22–5.81)
RDW: 14.4 % (ref 11.5–15.5)
WBC: 8.6 10*3/uL (ref 4.0–10.5)
nRBC: 0 % (ref 0.0–0.2)

## 2018-05-07 LAB — BASIC METABOLIC PANEL
Anion gap: 8 (ref 5–15)
BUN: 13 mg/dL (ref 8–23)
CO2: 23 mmol/L (ref 22–32)
Calcium: 8.8 mg/dL — ABNORMAL LOW (ref 8.9–10.3)
Chloride: 102 mmol/L (ref 98–111)
Creatinine, Ser: 0.89 mg/dL (ref 0.61–1.24)
GFR calc Af Amer: >60 mL/min (ref >60)
GFR calc non Af Amer: >60 mL/min (ref >60)
Glucose, Bld: 89 mg/dL (ref 70–99)
Potassium: 4.2 mmol/L (ref 3.5–5.1)
Sodium: 133 mmol/L — ABNORMAL LOW (ref 135–145)

## 2018-05-07 NOTE — Progress Notes (Signed)
RLE dressing changes complete at 0640.
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
## 2018-05-09 NOTE — Progress Notes (Signed)
Physical Therapy Treatment Patient Details Name: Mark Ayala MRN: [id number] DOB: Aug 18, 1955 Today's Date: 05/09/2018    History of Present Illness 63 year old male with history of chronic hep C, hypertension, bipolar disorder, pulmonary hypertension, alcohol abuse admitted with alcohol intoxication with desire to detox.  Due to concern for SI was seen by psychiatry who recommended inpatient psychiatric admission when stable.  Underwent R second toe partial amputation on 04/25/2018 due to osteomyelitis.     PT Comments    Pt presented in bed and agreed to engage in gait training and therex activities. Pt remains unable to adhere to weight bearing precautions on RLE. Pt continues to be limited by attention deficits and slow processing. Pt requires max VC/TC for heel strike and weight distribution in standing. Pt tolerated therex activities well and stated he is attempting to do them multiple times a day to help him improve. Pt progress indicates SNF as appropriate for d/c currently. Pt left with call bell/phone, all other needs within reach, chair alarm set, and dinner being served. Plan to progress gait and standing activities as pt better adheres to precautions.   Follow Up Recommendations  Supervision/Assistance - 24 hour;SNF     Equipment Recommendations  Rolling walker with 5" wheels    Recommendations for Other Services       Precautions / Restrictions Precautions Precautions: Fall Required Braces or Orthoses: Other Brace Other Brace: Darco shoe on R, left post op shoe (for height discrepancy) Restrictions Weight Bearing Restrictions: Yes RLE Weight Bearing: Partial weight bearing Other Position/Activity Restrictions: heel weight bearing in Darco shoe    Mobility  Bed Mobility Overal bed mobility: Needs Assistance Bed Mobility: Rolling;Sidelying to Sit Rolling: Min assist Sidelying to sit: Mod assist;HOB elevated       General bed mobility comments: modA for trunk  elevation out of bed and modA for BLE negotiation.  Transfers Overall transfer level: Needs assistance Equipment used: Rolling walker (2 wheeled) Transfers: Sit to/from Stand Sit to Stand: Mod assist         General transfer comment: Pt required increased assistance to power up from sit to stand. Pt c/o of pain and global stiffness.   Ambulation/Gait Ambulation/Gait assistance: Min assist Gait Distance (Feet): 30 Feet Assistive device: Rolling walker (2 wheeled) Gait Pattern/deviations: Step-to pattern;Decreased dorsiflexion - right;Trunk flexed Gait velocity: decreased   General Gait Details: Pt continues to roll foot and bear weight through R forefoot.   Stairs             Wheelchair Mobility    Modified Rankin (Stroke Patients Only)       Balance Overall balance assessment: Needs assistance Sitting-balance support: Single extremity supported;Feet unsupported Sitting balance-Leahy Scale: Good     Standing balance support: Bilateral upper extremity supported Standing balance-Leahy Scale: Poor Standing balance comment: Pt with initial forward flex in standing w/ max cues/mod A for correction.                             Cognition Arousal/Alertness: Awake/alert Behavior During Therapy: WFL for tasks assessed/performed Overall Cognitive Status: No family/caregiver present to determine baseline cognitive functioning                                 General Comments: slow processing      Exercises General Exercises - Lower Extremity Ankle Circles/Pumps: AROM;15 reps;Both;Supine Quad Sets: AROM;15 reps;Both;Supine Long Arc  Quad: AROM;15 reps;Seated;Both Hip ABduction/ADduction: AROM;Both;Supine;10 reps(green tband) Hip Flexion/Marching: AROM;Seated;15 reps;Both    General Comments        Pertinent Vitals/Pain Faces Pain Scale: Hurts a little bit Pain Location: R foot Pain Descriptors / Indicators: Aching    Home Living                       Prior Function            PT Goals (current goals can now be found in the care plan section) Acute Rehab PT Goals Patient Stated Goal: to get stronger PT Goal Formulation: With patient Potential to Achieve Goals: Good    Frequency    Min 3X/week      PT Plan Current plan remains appropriate    Co-evaluation              AM-PAC PT "6 Clicks" Mobility   Outcome Measure  Help needed turning from your back to your side while in a flat bed without using bedrails?: None Help needed moving from lying on your back to sitting on the side of a flat bed without using bedrails?: A Lot Help needed moving to and from a bed to a chair (including a wheelchair)?: A Little Help needed standing up from a chair using your arms (e.g., wheelchair or bedside chair)?: A Lot Help needed to walk in hospital room?: A Little Help needed climbing 3-5 steps with a railing? : Total 6 Click Score: 15    End of Session Equipment Utilized During Treatment: Gait belt;Other (comment) Activity Tolerance: Patient tolerated treatment well Patient left: with call bell/phone within reach;in chair;with chair alarm set Nurse Communication: Mobility status;Patient requests pain meds PT Visit Diagnosis: Other abnormalities of gait and mobility (R26.89);History of falling (Z91.81)     Time: 1470-9295 PT Time Calculation (min) (ACUTE ONLY): 30 min  Charges:  $Gait Training: 8-22 mins $Therapeutic Exercise: 8-22 mins                     Maryelizabeth Kaufmann, SPTA   Maryelizabeth Kaufmann 05/09/2018, 5:27 PM
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
## 2018-05-09 NOTE — Progress Notes (Signed)
Nutrition Follow-up  DOCUMENTATION CODES:   Not applicable  INTERVENTION:  - Ensure Enlive po BID, each supplement provides 350 kcal and 20 grams of protein - Continue double portions on meal trays - Continue snacks between meals - Continue MVI daily.  NUTRITION DIAGNOSIS:   Increased nutrient needs related to acute illness as evidenced by estimated needs.  Ongoing  GOAL:   Patient will meet greater than or equal to 90% of their needs  Progressing  MONITOR:   PO intake, Supplement acceptance, Weight trends, Labs  ASSESSMENT:   63 y.o. male with medical history significant of depression, alcohol abuse, hypertension, reflux, hepatitis C presenting with desire to detox.   2/17 - s/p amputation of right 2nd toe, I & D of subcutaneous tissue of RL leg  Spoke with pt who was in bedside chair.  Pt is still drinking Ensures BID, observed opened Ensure on bedside table but it was still full. Pt had ate about 25% of his meal; says his appetite has increased over the last few days. Per chart, meal completion has been around 75-100%.  Encouraged pt to continue to drink the Ensures, eat his snacks and order foods he enjoys eating. Pt was happy to drink the Ensure and says he always eats his snacks.   Per chart, pt wt has fluctuated over the last 2 weeks. Encouraged pt to continue to drink Ensure between meals and order items off the menu that he enjoys. Will continue to monitor.   Medications reviewed and include: Folic acid 1mg , MVI, protonix Labs reviewed: Na 133 (L)   Diet Order:   Diet Order            Diet regular Room service appropriate? No; Fluid consistency: Thin; Fluid restriction: 1200 mL Fluid  Diet effective now        Diet - low sodium heart healthy              EDUCATION NEEDS:   Not appropriate for education at this time  Skin:  Skin Assessment: Skin Integrity Issues: Skin Integrity Issues:: DTI DTI: sacrum Stage I: R toe Unstageable: R and L  tibia, R pre-tibia, R toe  Last BM:  3/1  Height:   Ht Readings from Last 1 Encounters:  04/20/18 6' (1.829 m)    Weight:   Wt Readings from Last 1 Encounters:  05/08/18 77 kg    Ideal Body Weight:  80.91 kg  BMI:  Body mass index is 23.02 kg/m.  Estimated Nutritional Needs:   Kcal:  2000-2200 kcal  Protein:  100-110 grams  Fluid:  >/= 2 L/day    [employer] Dietetic Intern

## 2018-05-09 NOTE — Progress Notes (Signed)
Serena Colonel reviewing patient and will visit him tomorrow.   Percell Locus Nala Kachel LCSW [phone]

## 2018-05-10 NOTE — Progress Notes (Signed)
Physical Therapy Treatment Patient Details Name: Mark Ayala MRN: [id number] DOB: 28-Nov-1955 Today's Date: 05/10/2018    History of Present Illness 63 year old male with history of chronic hep C, hypertension, bipolar disorder, pulmonary hypertension, alcohol abuse admitted with alcohol intoxication with desire to detox.  Due to concern for SI was seen by psychiatry who recommended inpatient psychiatric admission when stable.  Underwent R second toe partial amputation on 04/25/2018 due to osteomyelitis.     PT Comments    Pt presented setting off chair alarm and walking though room without AD or Darco Shoe on RLE. Pt very agitated and confused. Pt rambled incoherently for a time about events from his past, present and future, intermingling timelines back and forth. Pt initially refused to listen to any staff present. After max encouragement to return to bed and providing gait belt, AD, and HHA, pt agreed to transfer back to bed. Transfer required mod A. Pt left with call bell/phone within reach, bed alarm set, and nursing getting him comfortable. Pt fully weight bearing on RLE throughout event.   Follow Up Recommendations  Supervision/Assistance - 24 hour;SNF     Equipment Recommendations  Rolling walker with 5" wheels    Recommendations for Other Services       Precautions / Restrictions Precautions Precautions: Fall Required Braces or Orthoses: Other Brace Other Brace: Darco shoe on R, left post op shoe (for height discrepancy) Restrictions Weight Bearing Restrictions: Yes RLE Weight Bearing: Partial weight bearing Other Position/Activity Restrictions: heel weight bearing in Darco shoe    Mobility  Bed Mobility Overal bed mobility: Needs Assistance Bed Mobility: Sit to Supine       Sit to supine: Mod assist   General bed mobility comments: mod A for trunk lowering and BLE negotiation to get pt back in bed  Transfers Overall transfer level: Needs assistance Equipment used:  Rolling walker (2 wheeled) Transfers: Sit to/from Stand Sit to Stand: Mod assist         General transfer comment: poor eccentric control to bed and current impulsiveness lead to mod A for transfers  Ambulation/Gait                 Stairs             Wheelchair Mobility    Modified Rankin (Stroke Patients Only)       Balance Overall balance assessment: Needs assistance Sitting-balance support: Single extremity supported;Feet unsupported Sitting balance-Leahy Scale: Good     Standing balance support: Bilateral upper extremity supported;No upper extremity supported Standing balance-Leahy Scale: Poor Standing balance comment: Pt in room walking without AD upon entry of staff. Pt experienced a LOB to the R side when being contact guarded by SPTA that mequired mod A to correct and prevent fall.                             Cognition Arousal/Alertness: Awake/alert Behavior During Therapy: Agitated;Anxious;Impulsive Overall Cognitive Status: No family/caregiver present to determine baseline cognitive functioning                                 General Comments: slow processing, pt delirious and rambling incoherently about events from his past, pt upset and attempting to leave hospital      Exercises      General Comments        Pertinent Vitals/Pain Faces Pain Scale: Hurts a  little bit Pain Location: R foot Pain Descriptors / Indicators: Aching    Home Living                      Prior Function            PT Goals (current goals can now be found in the care plan section) Acute Rehab PT Goals PT Goal Formulation: Patient unable to participate in goal setting Potential to Achieve Goals: Good    Frequency    Min 3X/week      PT Plan Current plan remains appropriate    Co-evaluation              AM-PAC PT "6 Clicks" Mobility   Outcome Measure  Help needed turning from your back to your side while  in a flat bed without using bedrails?: None Help needed moving from lying on your back to sitting on the side of a flat bed without using bedrails?: A Lot Help needed moving to and from a bed to a chair (including a wheelchair)?: A Little Help needed standing up from a chair using your arms (e.g., wheelchair or bedside chair)?: A Lot Help needed to walk in hospital room?: A Little Help needed climbing 3-5 steps with a railing? : Total 6 Click Score: 15    End of Session Equipment Utilized During Treatment: Gait belt;Other (comment) Activity Tolerance: Other (comment)(pt limited by confusion and other self limiting behaviors) Patient left: with call bell/phone within reach;with bed alarm set;in bed Nurse Communication: Mobility status PT Visit Diagnosis: Other abnormalities of gait and mobility (R26.89);History of falling (Z91.81)     Time: 0233-4356 PT Time Calculation (min) (ACUTE ONLY): 20 min  Charges:  $Gait Training: 8-22 mins                     Maryelizabeth Kaufmann, SPTA   Maryelizabeth Kaufmann 05/10/2018, 5:21 PM
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
## 2018-05-11 MED ORDER — VITAMIN B-1 100 MG PO TABS
100.0000 mg | ORAL_TABLET | Freq: Every day | ORAL | Status: DC
  Administered 2018-05-11 – 2018-05-18 (×8): 100 mg via ORAL
  Filled 2018-05-11 (×8): qty 1

## 2018-05-11 MED ORDER — HALOPERIDOL LACTATE 5 MG/ML IJ SOLN
5.0000 mg | Freq: Once | INTRAMUSCULAR | Status: AC
  Administered 2018-05-12: 5 mg via INTRAVENOUS
  Filled 2018-05-11: qty 1

## 2018-05-11 MED ORDER — QUETIAPINE FUMARATE 25 MG PO TABS
25.0000 mg | ORAL_TABLET | Freq: Two times a day (BID) | ORAL | Status: DC
  Administered 2018-05-11 – 2018-05-13 (×6): 25 mg via ORAL
  Filled 2018-05-11 (×6): qty 1

## 2018-05-11 MED ORDER — LORAZEPAM 2 MG/ML IJ SOLN
0.5000 mg | Freq: Once | INTRAMUSCULAR | Status: DC
  Filled 2018-05-11: qty 1
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
## 2018-05-11 NOTE — Progress Notes (Signed)
Pt noted to be very irritable, restless, and confused throughout the night. Pt attempted to leave room, and argue with staff stating he was leaving and had people to see. MD notified of pt's actions. MD order ativan X 1. RN administered medication per MD's order. Pt currently sitting in chair, still refusing to return to bed, and rambling. RN to report of to oncoming RN, and continue to monitor.

## 2018-05-12 LAB — BASIC METABOLIC PANEL
Anion gap: 9 (ref 5–15)
BUN: 28 mg/dL — AB (ref 8–23)
CO2: 24 mmol/L (ref 22–32)
Calcium: 8.9 mg/dL (ref 8.9–10.3)
Chloride: 104 mmol/L (ref 98–111)
Creatinine, Ser: 1.07 mg/dL (ref 0.61–1.24)
GFR calc Af Amer: >60 mL/min (ref >60)
GFR calc non Af Amer: >60 mL/min (ref >60)
GLUCOSE: 84 mg/dL (ref 70–99)
Potassium: 4.4 mmol/L (ref 3.5–5.1)
Sodium: 137 mmol/L (ref 135–145)

## 2018-05-12 LAB — MAGNESIUM: Magnesium: 1.9 mg/dL (ref 1.7–2.4)
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
## 2018-05-12 NOTE — Progress Notes (Signed)
Physical Therapy Treatment Patient Details Name: Mark Ayala MRN: [id number] DOB: [birth year] Today's Date: 05/12/2018    History of Present Illness 63 year old male with history of chronic hep C, hypertension, bipolar disorder, pulmonary hypertension, alcohol abuse admitted with alcohol intoxication with desire to detox.  Due to concern for SI was seen by psychiatry who recommended inpatient psychiatric admission when stable.  Underwent R second toe partial amputation on 04/25/2018 due to osteomyelitis.     PT Comments    Pt still needs repetitive cues for direction and for sequencing during gait.  Minimal cues work best.  Emphasis on transitions, sit to stand, general safety and gait training.   Follow Up Recommendations  Supervision/Assistance - 24 hour;SNF     Equipment Recommendations  Rolling walker with 5" wheels    Recommendations for Other Services       Precautions / Restrictions Precautions Precautions: Fall Other Brace: Darco shoe on R, left post op shoe (for height discrepancy) Restrictions Other Position/Activity Restrictions: heel weight bearing in Darco shoe    Mobility  Bed Mobility Overal bed mobility: Needs Assistance Bed Mobility: Supine to Sit     Supine to sit: Min assist        Transfers Overall transfer level: Needs assistance Equipment used: Rolling walker (2 wheeled) Transfers: Sit to/from Stand Sit to Stand: Min assist         General transfer comment: cues for hand placement and direction.  Assist to come forward and some boost.  Ambulation/Gait Ambulation/Gait assistance: Min assist Gait Distance (Feet): 90 Feet Assistive device: Rolling walker (2 wheeled) Gait Pattern/deviations: Step-to pattern   Gait velocity interpretation: <1.31 ft/sec, indicative of household ambulator General Gait Details: mildly unsteady, poor sequencing, poor use of the [facility], but still better when cues are kept to a minimum and assist is consistently  given.   Stairs             Wheelchair Mobility    Modified Rankin (Stroke Patients Only)       Balance Overall balance assessment: Needs assistance   Sitting balance-Leahy Scale: Good     Standing balance support: Bilateral upper extremity supported;No upper extremity supported Standing balance-Leahy Scale: Poor                              Cognition Arousal/Alertness: Awake/alert Behavior During Therapy: Flat affect;Restless                                   General Comments: slow processing, no following direction consistently      Exercises      General Comments        Pertinent Vitals/Pain Faces Pain Scale: Hurts a little bit Pain Location: R foot    Home Living                      Prior Function            PT Goals (current goals can now be found in the care plan section) Acute Rehab PT Goals PT Goal Formulation: Patient unable to participate in goal setting Time For Goal Achievement: 05/18/18 Potential to Achieve Goals: Good Progress towards PT goals: Progressing toward goals    Frequency    Min 3X/week      PT Plan Current plan remains appropriate    Co-evaluation  AM-PAC PT "6 Clicks" Mobility   Outcome Measure  Help needed turning from your back to your side while in a flat bed without using bedrails?: A Little Help needed moving from lying on your back to sitting on the side of a flat bed without using bedrails?: A Little Help needed moving to and from a bed to a chair (including a wheelchair)?: A Little Help needed standing up from a chair using your arms (e.g., wheelchair or bedside chair)?: A Little Help needed to walk in hospital room?: A Little Help needed climbing 3-5 steps with a railing? : A Lot 6 Click Score: 17    End of Session   Activity Tolerance: Patient tolerated treatment well Patient left: in chair;with call bell/phone within reach(chair  posey) Nurse Communication: Mobility status PT Visit Diagnosis: Other abnormalities of gait and mobility (R26.89);History of falling (Z91.81)     Time: 2707-8675 PT Time Calculation (min) (ACUTE ONLY): 22 min  Charges:  $Gait Training: 8-22 mins                     05/12/2018  Donnella Sham, PT Acute Rehabilitation Services [phone]  (pager) [phone]  (office)   Tessie Fass Celica Kotowski 05/12/2018, 5:51 PM

## 2018-05-12 NOTE — Progress Notes (Signed)
[facility] hoping to have an answer by tomorrow if they can take patient. Pasrr: [phone] [place] LCSW [phone]

## 2018-05-12 NOTE — Progress Notes (Signed)
Patient irate and agitated this afternoon. One time dose of IV haldol given at 1230. Patient is now calm and resting in his bed.

## 2018-05-13 MED ORDER — ENSURE ENLIVE PO LIQD: 237.0000 mL | Freq: Two times a day (BID) | ORAL | 12 refills | Status: AC

## 2018-05-13 MED ORDER — LIDOCAINE 5 % EX PTCH: 1.0000 | MEDICATED_PATCH | CUTANEOUS | 0 refills | Status: AC

## 2018-05-13 MED ORDER — OXYCODONE HCL 5 MG PO TABS: 5.0000 mg | ORAL_TABLET | ORAL | 0 refills | Status: AC | PRN

## 2018-05-13 MED ORDER — QUETIAPINE FUMARATE 25 MG PO TABS: 25.0000 mg | ORAL_TABLET | Freq: Two times a day (BID) | ORAL | Status: DC
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
## 2018-05-13 NOTE — Plan of Care (Signed)
  Problem: Safety: Goal: Ability to remain free from injury will improve Outcome: Progressing   Problem: Education: Goal: Knowledge of General Education information will improve Description Including pain rating scale, medication(s)/side effects and non-pharmacologic comfort measures Outcome: Progressing   Problem: Health Behavior/Discharge Planning: Goal: Ability to manage health-related needs will improve Outcome: Progressing   Problem: Clinical Measurements: Goal: Ability to maintain clinical measurements within normal limits will improve Outcome: Progressing Goal: Will remain free from infection Outcome: Progressing Goal: Diagnostic test results will improve Outcome: Progressing Goal: Respiratory complications will improve Outcome: Progressing Goal: Cardiovascular complication will be avoided Outcome: Progressing   Problem: Activity: Goal: Risk for activity intolerance will decrease Outcome: Progressing   Problem: Nutrition: Goal: Adequate nutrition will be maintained Outcome: Progressing   Problem: Coping: Goal: Level of anxiety will decrease Outcome: Progressing   Problem: Elimination: Goal: Will not experience complications related to bowel motility Outcome: Progressing Goal: Will not experience complications related to urinary retention Outcome: Progressing   Problem: Pain Managment: Goal: General experience of comfort will improve Outcome: Progressing   Problem: Safety: Goal: Ability to remain free from injury will improve Outcome: Progressing   Problem: Skin Integrity: Goal: Risk for impaired skin integrity will decrease Outcome: Progressing   Problem: Education: Goal: Required Educational Video(s) Outcome: Progressing   Problem: Clinical Measurements: Goal: Postoperative complications will be avoided or minimized Outcome: Progressing   Problem: Skin Integrity: Goal: Demonstration of wound healing without infection will improve Outcome:  Progressing

## 2018-05-14 MED ORDER — QUETIAPINE FUMARATE 25 MG PO TABS
50.0000 mg | ORAL_TABLET | Freq: Two times a day (BID) | ORAL | Status: DC
  Administered 2018-05-14 – 2018-05-18 (×9): 50 mg via ORAL
  Filled 2018-05-14 (×9): qty 2

## 2018-05-14 MED ORDER — QUETIAPINE FUMARATE 50 MG PO TABS: 50.0000 mg | ORAL_TABLET | Freq: Two times a day (BID) | ORAL | Status: AC
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
## 2018-05-14 NOTE — Progress Notes (Signed)
Patient getting out of bed stating he is going to go home getting agitated with staff. Tried redirecting patient to sit in chair. Dr. Candiss Norse in room and verbally ordered to increase seroquel to 50 MG BID.

## 2018-05-15 LAB — BASIC METABOLIC PANEL
Anion gap: 12 (ref 5–15)
BUN: 22 mg/dL (ref 8–23)
CHLORIDE: 104 mmol/L (ref 98–111)
CO2: 19 mmol/L — ABNORMAL LOW (ref 22–32)
Calcium: 9.7 mg/dL (ref 8.9–10.3)
Creatinine, Ser: 0.92 mg/dL (ref 0.61–1.24)
GFR calc Af Amer: >60 mL/min (ref >60)
GFR calc non Af Amer: >60 mL/min (ref >60)
Glucose, Bld: 91 mg/dL (ref 70–99)
Potassium: 4.7 mmol/L (ref 3.5–5.1)
Sodium: 135 mmol/L (ref 135–145)

## 2018-05-15 LAB — PROTIME-INR
INR: 1.1 (ref 0.8–1.2)
Prothrombin Time: 14 seconds (ref 11.4–15.2)

## 2018-05-15 LAB — CBC
HCT: 37 % — ABNORMAL LOW (ref 39.0–52.0)
Hemoglobin: 12.4 g/dL — ABNORMAL LOW (ref 13.0–17.0)
MCH: 35.3 pg — ABNORMAL HIGH (ref 26.0–34.0)
MCHC: 33.5 g/dL (ref 30.0–36.0)
MCV: 105.4 fL — ABNORMAL HIGH (ref 80.0–100.0)
Platelets: 166 10*3/uL (ref 150–400)
RBC: 3.51 MIL/uL — ABNORMAL LOW (ref 4.22–5.81)
RDW: 13.8 % (ref 11.5–15.5)
WBC: 7.2 10*3/uL (ref 4.0–10.5)
nRBC: 0 % (ref 0.0–0.2)
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
## 2018-05-16 NOTE — Progress Notes (Signed)
Nutrition Follow-up  DOCUMENTATION CODES:   Not applicable  INTERVENTION:   - Continue double portions on meal trays  - Continue snacks between meals  - Ensure Enlive po BID, each supplement provides 350 kcal and 20 grams of protein  - Continue MVI with minerals daily  NUTRITION DIAGNOSIS:   Increased nutrient needs related to acute illness as evidenced by estimated needs.  Ongoing  GOAL:   Patient will meet greater than or equal to 90% of their needs  Meeting  MONITOR:   PO intake, Supplement acceptance, Weight trends, Labs  REASON FOR ASSESSMENT:   Consult Assessment of nutrition requirement/status  ASSESSMENT:   63 y.o. male with medical history significant of depression, alcohol abuse, hypertension, reflux, hepatitis C presenting with desire to detox.    2/17 - s/p amputation of right 2nd toe, I & D of subcutaneous tissue of RL leg  Pt with great appetite per RN. Meal completions charted as 75-100% for pt's last eight meals. Pt takes Ensure BID.   Weight noted to decrease from 170 lb on 8/1 to 161 lb today. Will continue to monitor trends.   Pt medically stable for discharge. Awaiting SNF placement.   Medications reviewed and include: folic acid, MVI with minerals, thiamine Labs reviewed.   Diet Order:   Diet Order            Diet regular Room service appropriate? No; Fluid consistency: Thin; Fluid restriction: 1200 mL Fluid  Diet effective now        Diet - low sodium heart healthy              EDUCATION NEEDS:   Not appropriate for education at this time  Skin:  Skin Assessment: Skin Integrity Issues: Skin Integrity Issues:: DTI DTI: sacrum Stage I: R toe Unstageable: R and L tibia, R pre-tibia, R toe  Last BM:  3/8  Height:   Ht Readings from Last 1 Encounters:  04/20/18 6' (1.829 m)    Weight:   Wt Readings from Last 1 Encounters:  05/16/18 72.9 kg    Ideal Body Weight:  80.91 kg  BMI:  Body mass index is 21.8  kg/m.  Estimated Nutritional Needs:   Kcal:  2000-2200 kcal  Protein:  100-110 grams  Fluid:  >/= 2 L/day    Mariana Single RD, LDN Clinical Nutrition Pager # - [phone]
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
## 2018-05-17 NOTE — Progress Notes (Signed)
[facility] still awaiting approval from their financial department.   Percell Locus Tyren Dugar LCSW [phone]

## 2018-05-17 NOTE — Progress Notes (Signed)
Subjective: 22 Days Post-Op Procedure(s) (LRB): PARTIAL  AMPUTATION OF RIGHT 2nd TOE (Right) Patient reports pain as mild.    Objective: Vital signs in last 24 hours: Temp:  [98 F (36.7 C)] 98 F (36.7 C) (03/10 0543) Pulse Rate:  [69-94] 88 (03/10 0543) Resp:  [15-20] 20 (03/10 0543) BP: (108-119)/(61-70) 119/70 (03/10 0543) SpO2:  [94 %-97 %] 97 % (03/10 0543) Weight:  [72.9 kg] 72.9 kg (03/10 0543)  Intake/Output from previous day: 03/09 0701 - 03/10 0700 In: 1020 [P.O.:1020] Out: 1000 [Urine:1000] Intake/Output this shift: No intake/output data recorded.  Recent Labs    05/15/18 0803  HGB 12.4*   Recent Labs    05/15/18 0803  WBC 7.2  RBC 3.51*  HCT 37.0*  PLT 166   Recent Labs    05/15/18 0803  NA 135  K 4.7  CL 104  CO2 19*  BUN 22  CREATININE 0.92  GLUCOSE 91  CALCIUM 9.7   Recent Labs    05/15/18 0803  INR 1.1    Neurovascular intact Intact pulses distally Dorsiflexion/Plantar flexion intact Incision: dressing C/D/I No cellulitis present Compartment soft   Assessment/Plan: 22 Days Post-Op Procedure(s) (LRB): PARTIAL  AMPUTATION OF RIGHT 2nd TOE (Right) Up with therapy Sutures removed by me today and bandage applied Please proceed with bactroban dressing changes twice daily Heel weight bearing RLE F/u with Dr. Erlinda Hong once discharged    Aundra Dubin 05/17/2018, 8:28 AM

## 2018-05-17 NOTE — Progress Notes (Addendum)
3pm- CSW AD approved transport with [facility] ($350). They will pick patient up at 1pm on Wednesday for transport to [place].   12:37pm-[facility] able to accept patient tomorrow. CSW updated patient's daughter and will see how to arrange transportation.   Percell Locus Sanjith Siwek LCSW [phone]

## 2018-05-17 NOTE — Progress Notes (Signed)
Physical Therapy Treatment Patient Details Name: Mark Ayala MRN: [id number] DOB: [birth year] Today's Date: 05/17/2018    History of Present Illness 63 year old male with history of chronic hep C, hypertension, bipolar disorder, pulmonary hypertension, alcohol abuse admitted with alcohol intoxication with desire to detox.  Due to concern for SI was seen by psychiatry who recommended inpatient psychiatric admission when stable.  Underwent R second toe partial amputation on 04/25/2018 due to osteomyelitis.     PT Comments    Pt mobilizing well but has difficulty comprehending and thus maintaining WB in heel, even with Darco. Worked on ambulation today focusing on improving posture, attending to task, and safer turning. Pt has been restless this morning so also increased distance to help him expend some energy. PT will continue to follow.    Follow Up Recommendations  Supervision/Assistance - 24 hour;SNF;Other (comment)(rehab)     Equipment Recommendations  Rolling walker with 5" wheels    Recommendations for Other Services       Precautions / Restrictions Precautions Precautions: Fall Required Braces or Orthoses: Other Brace Other Brace: Darco shoe on R, left post op shoe (for height discrepancy) Restrictions Weight Bearing Restrictions: Yes RLE Weight Bearing: Partial weight bearing RLE Partial Weight Bearing Percentage or Pounds: (heel, with boot when ambulating) Other Position/Activity Restrictions: heel weight bearing in Darco shoe    Mobility  Bed Mobility               General bed mobility comments: pt up in chair  Transfers Overall transfer level: Needs assistance Equipment used: Rolling walker (2 wheeled) Transfers: Sit to/from Stand Sit to Stand: Min assist;Min guard         General transfer comment: practiced sit to stand several times, min-guard when pushing from arm rests, worked on pushing from [employer] for strengthening and pt struggled with this from a  problem solving standpoint as well as needing min A.   Ambulation/Gait Ambulation/Gait assistance: Min guard Gait Distance (Feet): 500 Feet Assistive device: Rolling walker (2 wheeled) Gait Pattern/deviations: Step-through pattern;Drifts right/left Gait velocity: decreased Gait velocity interpretation: <1.8 ft/sec, indicate of risk for recurrent falls General Gait Details: even with use of Darco, pt tends to rock wt fwd towards toes, vc's to maintain wt in heel with poor comprehension of this by pt. Worked on erect posture, attending to task, and turning both directions. Gets very distracted when passing people in hall because he wants to talk   Stairs             Wheelchair Mobility    Modified Rankin (Stroke Patients Only)       Balance Overall balance assessment: Needs assistance Sitting-balance support: Single extremity supported;Feet unsupported Sitting balance-Leahy Scale: Good     Standing balance support: No upper extremity supported Standing balance-Leahy Scale: Fair Standing balance comment: able to maintain static stance without UE support but unsafe without supervision                            Cognition Arousal/Alertness: Awake/alert Behavior During Therapy: Flat affect;Restless Overall Cognitive Status: No family/caregiver present to determine baseline cognitive functioning                                 General Comments: somewhat HOH which affects following of commands but also very verbose and self distracting      Exercises      General  Comments        Pertinent Vitals/Pain Pain Assessment: Faces Faces Pain Scale: Hurts a little bit Pain Location: R foot Pain Descriptors / Indicators: Sore Pain Intervention(s): Monitored during session    Home Living                      Prior Function            PT Goals (current goals can now be found in the care plan section) Acute Rehab PT Goals Patient  Stated Goal: to get stronger PT Goal Formulation: Patient unable to participate in goal setting Time For Goal Achievement: 05/18/18 Potential to Achieve Goals: Good Progress towards PT goals: Progressing toward goals    Frequency    Min 3X/week      PT Plan Current plan remains appropriate    Co-evaluation              AM-PAC PT "6 Clicks" Mobility   Outcome Measure  Help needed turning from your back to your side while in a flat bed without using bedrails?: A Little Help needed moving from lying on your back to sitting on the side of a flat bed without using bedrails?: A Little Help needed moving to and from a bed to a chair (including a wheelchair)?: A Little Help needed standing up from a chair using your arms (e.g., wheelchair or bedside chair)?: A Little Help needed to walk in hospital room?: A Little Help needed climbing 3-5 steps with a railing? : A Lot 6 Click Score: 17    End of Session Equipment Utilized During Treatment: Gait belt;Other (comment)(Darco and post op shoe) Activity Tolerance: Patient tolerated treatment well Patient left: in chair;with call bell/phone within reach;with chair alarm set Nurse Communication: Mobility status PT Visit Diagnosis: Other abnormalities of gait and mobility (R26.89);History of falling (Z91.81)     Time: 3646-8032 PT Time Calculation (min) (ACUTE ONLY): 32 min  Charges:  $Gait Training: 23-37 mins                     Leighton Roach, [place]  Pager [phone] [place] 05/17/2018, 10:21 AM

## 2018-05-18 DIAGNOSIS — R748 Abnormal levels of other serum enzymes: Secondary | ICD-10-CM

## 2018-05-18 LAB — BASIC METABOLIC PANEL
Anion gap: 4 — ABNORMAL LOW (ref 5–15)
BUN: 29 mg/dL — ABNORMAL HIGH (ref 8–23)
CO2: 27 mmol/L (ref 22–32)
Calcium: 9.2 mg/dL (ref 8.9–10.3)
Chloride: 107 mmol/L (ref 98–111)
Creatinine, Ser: 1.1 mg/dL (ref 0.61–1.24)
GFR calc Af Amer: >60 mL/min (ref >60)
GFR calc non Af Amer: >60 mL/min (ref >60)
Glucose, Bld: 83 mg/dL (ref 70–99)
Potassium: 4.9 mmol/L (ref 3.5–5.1)
Sodium: 138 mmol/L (ref 135–145)

## 2018-05-18 NOTE — Clinical Social Work Placement (Signed)
   CLINICAL SOCIAL WORK PLACEMENT  NOTE  Date:  05/18/2018  Patient Details  Name: Mark Ayala MRN: [id number] Date of Birth: June 23, 1955  Clinical Social Work is seeking post-discharge placement for this patient at the [place] level of care (*CSW will initial, date and re-position this form in  chart as items are completed):  Yes   Patient/family provided with [facility] Work Department's list of facilities offering this level of care within the geographic area requested by the patient (or if unable, by the patient's family).  Yes   Patient/family informed of their freedom to choose among providers that offer the needed level of care, that participate in Medicare, Medicaid or managed care program needed by the patient, have an available bed and are willing to accept the patient.  Yes   Patient/family informed of [facility]'s ownership interest in [facility] and [facility], as well as of the fact that they are under no obligation to receive care at these facilities.  PASRR submitted to EDS on 05/09/18     PASRR number received on 05/13/18     Existing PASRR number confirmed on       FL2 transmitted to all facilities in geographic area requested by pt/family on 05/09/18     FL2 transmitted to all facilities within larger geographic area on       Patient informed that his/her managed care company has contracts with or will negotiate with certain facilities, including the following:        Yes   Patient/family informed of bed offers received.  Patient chooses bed at Other - please specify in the comment section below:([facility])     Physician recommends and patient chooses bed at      Patient to be transferred to Other - please specify in the comment section below:([facility]) on 05/18/18.  Patient to be transferred to facility by [facility] wheelchair Lucianne Lei     Patient family notified on 05/18/18 of transfer.  Name  of family member notified:  Daughter, Abby     PHYSICIAN       Additional Comment:    _______________________________________________ Benard Halsted, LCSW 05/18/2018, 9:56 AM

## 2018-05-18 NOTE — Progress Notes (Signed)
Report called to Mark Ayala at [facility]

## 2018-05-18 NOTE — Progress Notes (Signed)
Patient will DC to: [facility] Anticipated DC date: 05/18/18 Family notified: Daughter, Medical laboratory scientific officer by: [place] (approved by CSW AD)  Please send med scripts with patient.   Per MD patient ready for DC to [facility]. RN, patient, patient's family, and facility notified of DC. Discharge Summary and FL2 sent to facility. RN to call report prior to discharge [phone]).   CSW will sign off for now as social work intervention is no longer needed. Please consult [place] again if new needs arise.  Cedric Fishman, LCSW Clinical Social Worker [phone]

## 2018-05-18 NOTE — Progress Notes (Signed)
Patient discharged via [place] (approved by CSW AD).  All belongings returned to the patient.
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
## 2019-02-07 DEATH — deceased

## 2020-03-07 IMAGING — MR MR TOES*R* W/O CM
4 of 5 series · 19 of 40 positions shown · non-contrast
Comparison: Plain films right foot 04/20/2018.

CLINICAL DATA: Right second toe redness, pain and swelling. Skin
ulcerations.

EXAM:
MRI OF THE RIGHT TOES WITHOUT CONTRAST
TECHNIQUE: Multiplanar, multisequence MR imaging of the right foot was
performed. No intravenous contrast was administered.

[Series 3: T1 · coronal · 3.0mm · 0.23mm/px · 9 of 34 slices shown (1 of 2)]
[im 1/34]
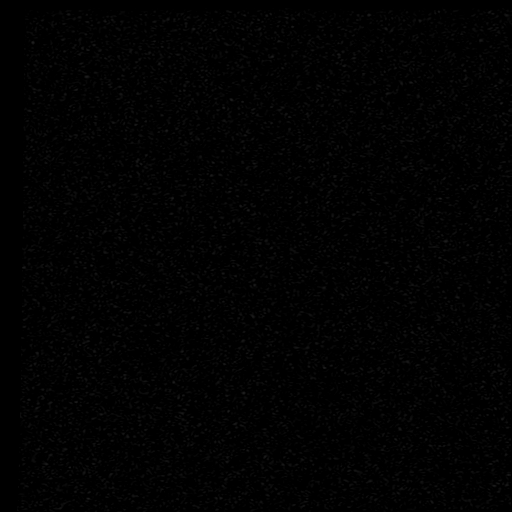
[im 5/34]
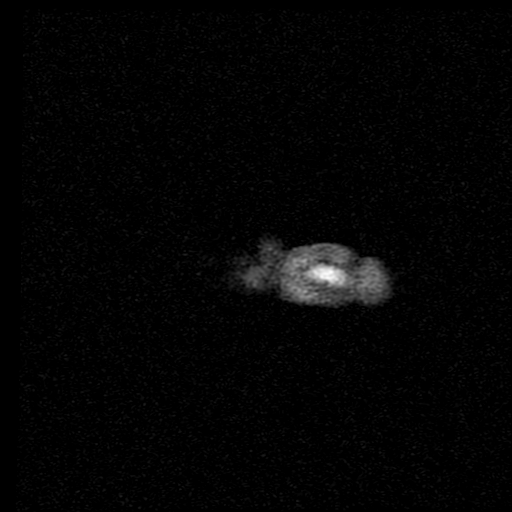
[im 9/34]
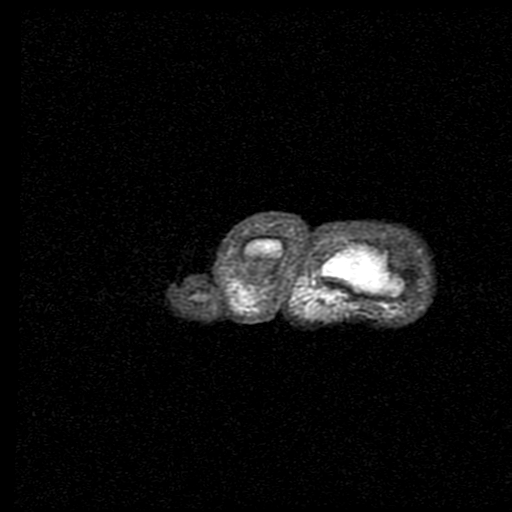
[im 13/34]
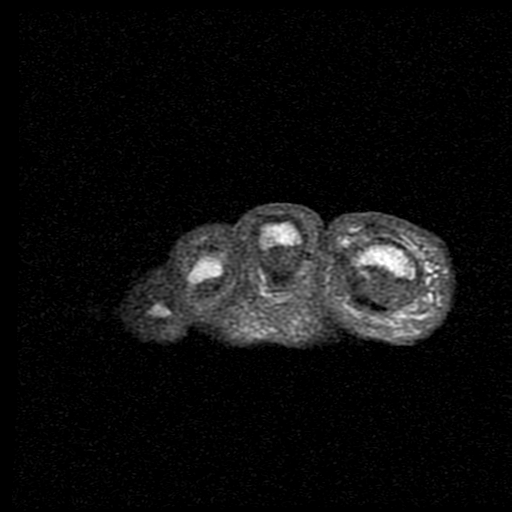
[im 17/34]
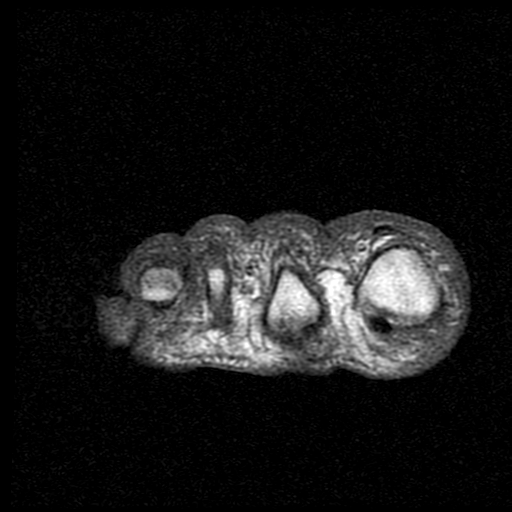
[im 21/34]
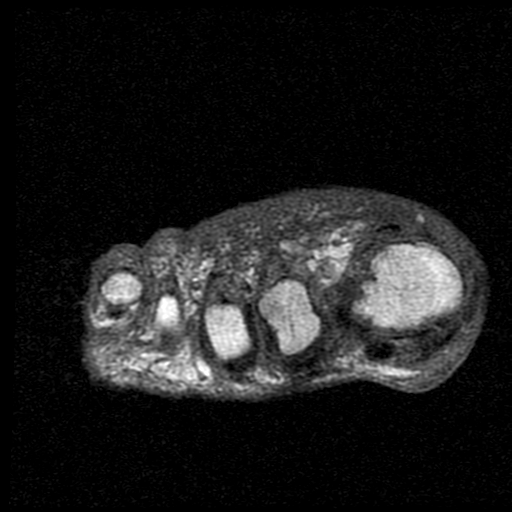
[im 25/34]
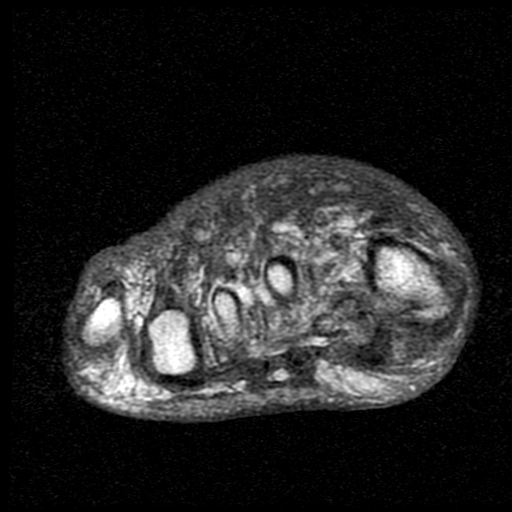
[im 29/34]
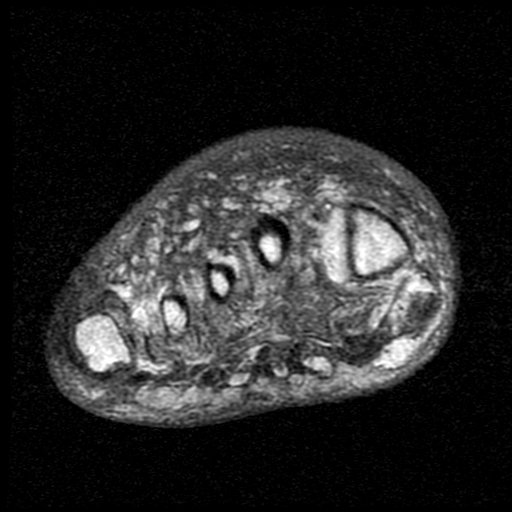
[im 34/34]
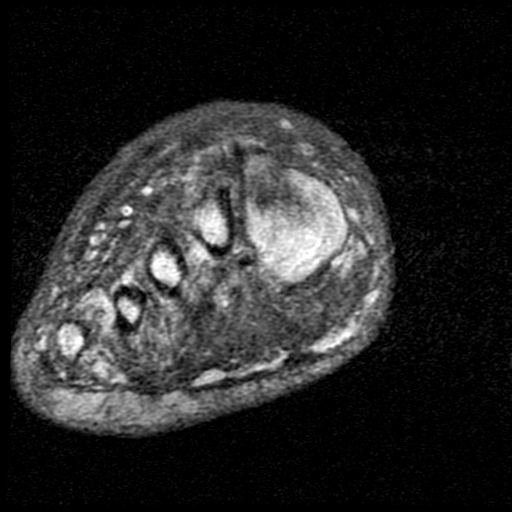

[Series 4: T2 · coronal · 3.0mm · 0.23mm/px · 3 of 34 slices shown (1 of 2)]
[im 5/34]
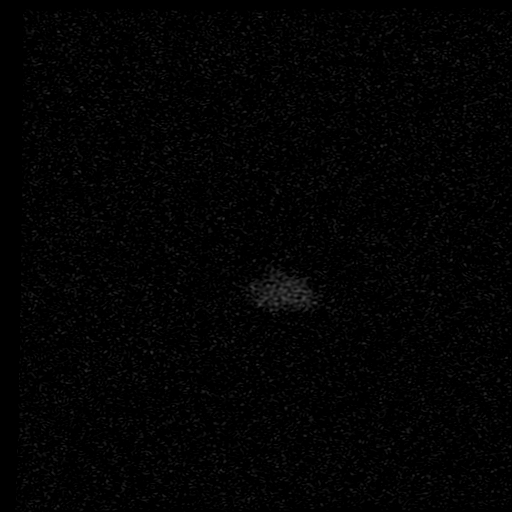
[im 17/34]
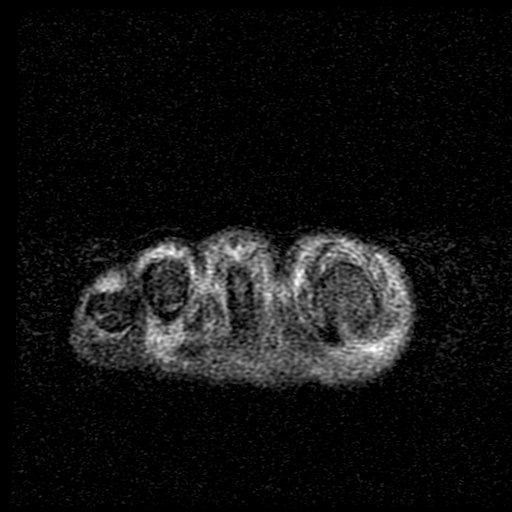
[im 29/34]
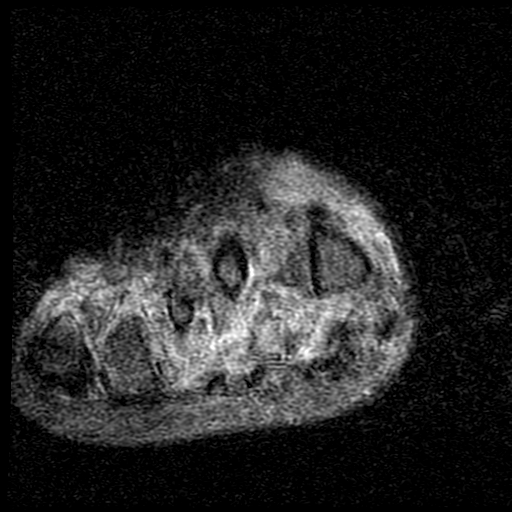

[Series 5: T2 · axial · 3.0mm · 0.35mm/px · z∈[-141,-78]mm · 3 of 24 slices shown (2 of 2)]
[im 4/24]
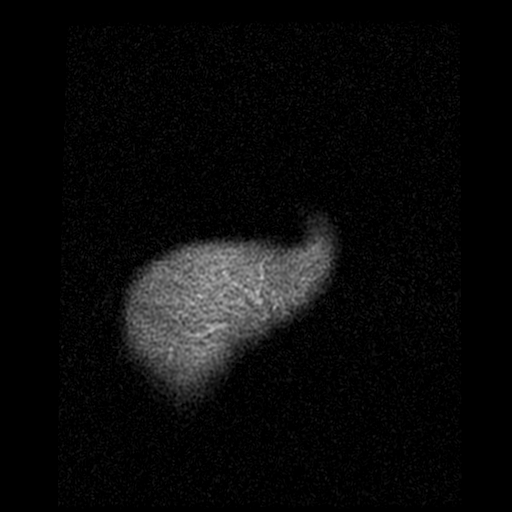
[im 12/24]
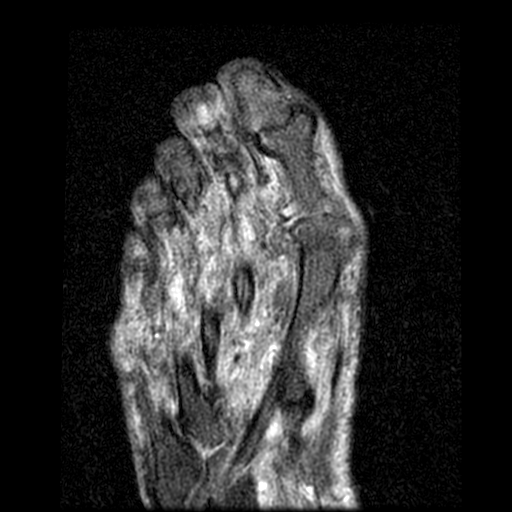
[im 20/24]
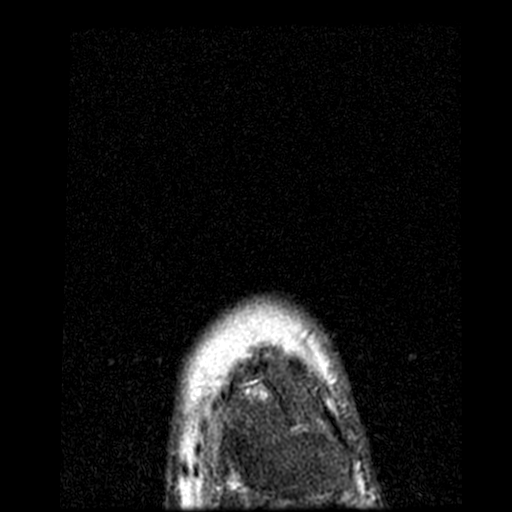

[Series 6: T1 · axial · 3.0mm · 0.35mm/px · z∈[-153,-78]mm · 4 of 24 slices shown (2 of 2)]
[im 1/24]
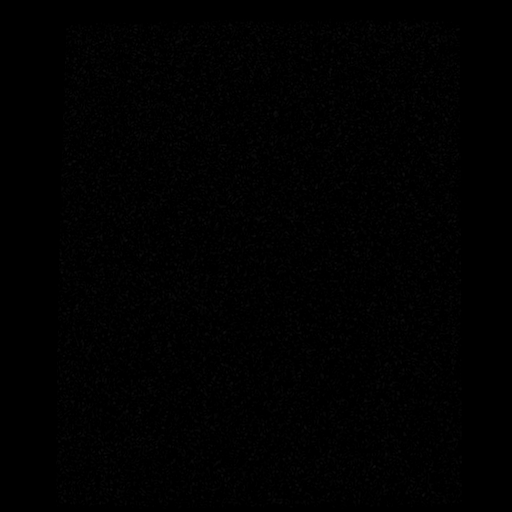
[im 4/24]
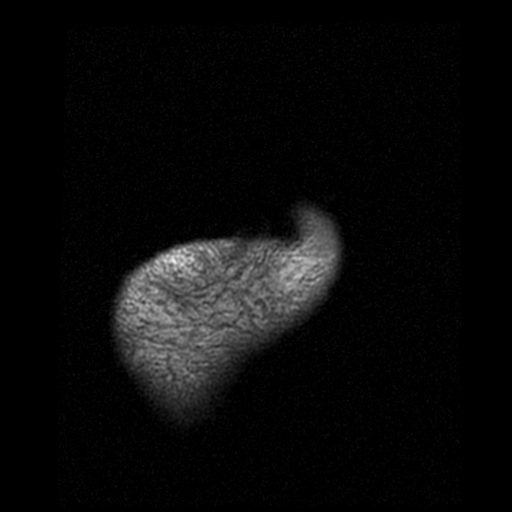
[im 12/24]
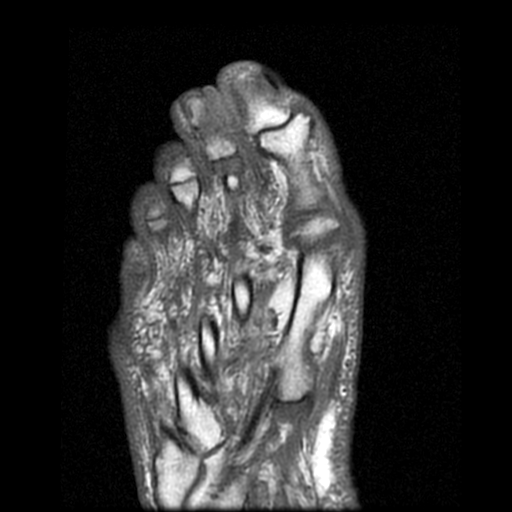
[im 20/24]
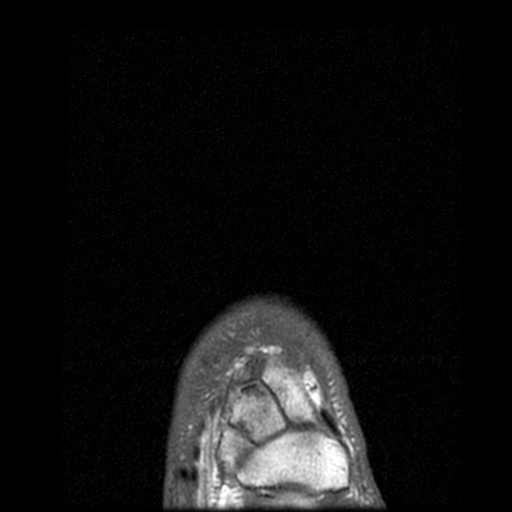

[19 of 40 positions shown; findings below may reference images not displayed]

FINDINGS: Patient motion degrades the study.

Bones/Joint/Cartilage

There is marrow edema in the distal phalanx of the second toe
worrisome for osteomyelitis. Hallux valgus is noted. The patient has
some osteoarthritic change about the first and second MTP joints.
Subchondral edema in the head of the second metatarsal is noted.

Ligaments

Appear intact.

Muscles and Tendons

There is some atrophy of intrinsic musculature of the foot. No
intramuscular fluid collection.

Soft tissues

Subcutaneous edema is seen over the dorsum of the foot. No joint
effusion or abscess.
IMPRESSION: Motion degraded examination demonstrates marrow edema in the distal
phalanx of the second toe most consistent with osteomyelitis.
Negative for abscess, myositis or septic joint.

Moderate bilateral that moderate first and second MTP joint
osteoarthritis.

Hallux valgus.
